# Patient Record
Sex: Male | Born: 1935 | Race: White | Hispanic: No | Marital: Married | Smoking: Former smoker
Health system: Southern US, Community
[De-identification: ages and names within clinical notes are randomized; demographics above are authoritative.]

## PROBLEM LIST (undated history)

## (undated) DIAGNOSIS — N189 Chronic kidney disease, unspecified: Secondary | ICD-10-CM

## (undated) DIAGNOSIS — IMO0002 Reserved for concepts with insufficient information to code with codable children: Secondary | ICD-10-CM

## (undated) DIAGNOSIS — C9002 Multiple myeloma in relapse: Principal | ICD-10-CM

## (undated) DIAGNOSIS — D631 Anemia in chronic kidney disease: Secondary | ICD-10-CM

## (undated) DIAGNOSIS — IMO0001 Reserved for inherently not codable concepts without codable children: Secondary | ICD-10-CM

## (undated) DIAGNOSIS — M1990 Unspecified osteoarthritis, unspecified site: Secondary | ICD-10-CM

## (undated) DIAGNOSIS — F419 Anxiety disorder, unspecified: Secondary | ICD-10-CM

## (undated) DIAGNOSIS — I10 Essential (primary) hypertension: Secondary | ICD-10-CM

## (undated) DIAGNOSIS — E079 Disorder of thyroid, unspecified: Secondary | ICD-10-CM

## (undated) HISTORY — DX: Multiple myeloma in relapse: C90.02

## (undated) HISTORY — DX: Unspecified osteoarthritis, unspecified site: M19.90

## (undated) HISTORY — DX: Reserved for concepts with insufficient information to code with codable children: IMO0002

## (undated) HISTORY — PX: FRACTURE SURGERY: SHX138

## (undated) HISTORY — PX: PROSTATE SURGERY: SHX751

## (undated) HISTORY — DX: Anxiety disorder, unspecified: F41.9

## (undated) HISTORY — DX: Chronic kidney disease, unspecified: N18.9

## (undated) HISTORY — DX: Reserved for inherently not codable concepts without codable children: IMO0001

## (undated) HISTORY — DX: Disorder of thyroid, unspecified: E07.9

## (undated) HISTORY — PX: JOINT REPLACEMENT: SHX530

## (undated) HISTORY — DX: Anemia in chronic kidney disease: D63.1

---

## 1997-08-17 HISTORY — PX: INSERTION PROSTATE RADIATION SEED: SUR718

## 1999-08-26 ENCOUNTER — Ambulatory Visit (HOSPITAL_COMMUNITY): Admission: RE | Admit: 1999-08-26 | Discharge: 1999-08-26 | Payer: Self-pay | Admitting: *Deleted

## 1999-08-26 ENCOUNTER — Encounter (INDEPENDENT_AMBULATORY_CARE_PROVIDER_SITE_OTHER): Payer: Self-pay

## 1999-09-01 ENCOUNTER — Other Ambulatory Visit: Admission: RE | Admit: 1999-09-01 | Discharge: 1999-09-01 | Payer: Self-pay | Admitting: Urology

## 1999-09-12 ENCOUNTER — Encounter: Admission: RE | Admit: 1999-09-12 | Discharge: 1999-12-11 | Payer: Self-pay | Admitting: Radiation Oncology

## 1999-11-14 ENCOUNTER — Ambulatory Visit (HOSPITAL_BASED_OUTPATIENT_CLINIC_OR_DEPARTMENT_OTHER): Admission: RE | Admit: 1999-11-14 | Discharge: 1999-11-14 | Payer: Self-pay | Admitting: Urology

## 1999-11-14 ENCOUNTER — Encounter: Payer: Self-pay | Admitting: Urology

## 1999-12-15 ENCOUNTER — Encounter: Admission: RE | Admit: 1999-12-15 | Discharge: 2000-03-14 | Payer: Self-pay | Admitting: Radiation Oncology

## 2000-11-11 ENCOUNTER — Encounter (INDEPENDENT_AMBULATORY_CARE_PROVIDER_SITE_OTHER): Payer: Self-pay | Admitting: Specialist

## 2000-11-11 ENCOUNTER — Ambulatory Visit (HOSPITAL_COMMUNITY): Admission: RE | Admit: 2000-11-11 | Discharge: 2000-11-11 | Payer: Self-pay | Admitting: *Deleted

## 2001-01-25 ENCOUNTER — Other Ambulatory Visit: Admission: RE | Admit: 2001-01-25 | Discharge: 2001-01-25 | Payer: Self-pay | Admitting: Internal Medicine

## 2001-04-14 ENCOUNTER — Ambulatory Visit (HOSPITAL_COMMUNITY): Admission: RE | Admit: 2001-04-14 | Discharge: 2001-04-14 | Payer: Self-pay | Admitting: *Deleted

## 2001-05-19 ENCOUNTER — Encounter (INDEPENDENT_AMBULATORY_CARE_PROVIDER_SITE_OTHER): Payer: Self-pay | Admitting: Specialist

## 2001-05-19 ENCOUNTER — Ambulatory Visit (HOSPITAL_COMMUNITY): Admission: RE | Admit: 2001-05-19 | Discharge: 2001-05-19 | Payer: Self-pay | Admitting: *Deleted

## 2003-02-27 ENCOUNTER — Ambulatory Visit (HOSPITAL_COMMUNITY): Admission: RE | Admit: 2003-02-27 | Discharge: 2003-02-27 | Payer: Self-pay | Admitting: *Deleted

## 2003-02-27 ENCOUNTER — Encounter (INDEPENDENT_AMBULATORY_CARE_PROVIDER_SITE_OTHER): Payer: Self-pay | Admitting: Specialist

## 2003-08-06 ENCOUNTER — Ambulatory Visit (HOSPITAL_COMMUNITY): Admission: RE | Admit: 2003-08-06 | Discharge: 2003-08-06 | Payer: Self-pay | Admitting: *Deleted

## 2005-01-19 ENCOUNTER — Encounter: Admission: RE | Admit: 2005-01-19 | Discharge: 2005-01-19 | Payer: Self-pay

## 2005-01-21 ENCOUNTER — Ambulatory Visit (HOSPITAL_BASED_OUTPATIENT_CLINIC_OR_DEPARTMENT_OTHER): Admission: RE | Admit: 2005-01-21 | Discharge: 2005-01-21 | Payer: Self-pay

## 2005-01-21 ENCOUNTER — Ambulatory Visit (HOSPITAL_COMMUNITY): Admission: RE | Admit: 2005-01-21 | Discharge: 2005-01-21 | Payer: Self-pay

## 2005-02-02 ENCOUNTER — Emergency Department (HOSPITAL_COMMUNITY): Admission: EM | Admit: 2005-02-02 | Discharge: 2005-02-02 | Payer: Self-pay | Admitting: *Deleted

## 2005-02-03 ENCOUNTER — Inpatient Hospital Stay (HOSPITAL_COMMUNITY): Admission: AD | Admit: 2005-02-03 | Discharge: 2005-02-04 | Payer: Self-pay | Admitting: Pulmonary Disease

## 2005-08-03 ENCOUNTER — Ambulatory Visit (HOSPITAL_COMMUNITY): Admission: RE | Admit: 2005-08-03 | Discharge: 2005-08-03 | Payer: Self-pay | Admitting: *Deleted

## 2005-08-03 ENCOUNTER — Encounter (INDEPENDENT_AMBULATORY_CARE_PROVIDER_SITE_OTHER): Payer: Self-pay | Admitting: Specialist

## 2005-11-19 ENCOUNTER — Ambulatory Visit: Payer: Self-pay | Admitting: Hematology & Oncology

## 2005-11-26 LAB — CBC & DIFF AND RETIC
BASO%: 1.6 % (ref 0.0–2.0)
Eosinophils Absolute: 0.1 10*3/uL (ref 0.0–0.5)
HCT: 26.5 % — ABNORMAL LOW (ref 38.7–49.9)
LYMPH%: 29.2 % (ref 14.0–48.0)
MCHC: 35.8 g/dL (ref 32.0–35.9)
MCV: 88.2 fL (ref 81.6–98.0)
MONO%: 8.5 % (ref 0.0–13.0)
NEUT%: 59.1 % (ref 40.0–75.0)
Platelets: 182 10*3/uL (ref 145–400)
RBC: 3.01 10*6/uL — ABNORMAL LOW (ref 4.20–5.71)
Retic %: 1.2 % (ref 0.7–2.3)

## 2005-11-26 LAB — CHCC SMEAR

## 2005-11-27 LAB — PROTEIN ELECTROPHORESIS, SERUM
Albumin ELP: 64.6 % (ref 55.8–66.1)
Beta Globulin: 10.3 % — ABNORMAL HIGH (ref 4.7–7.2)
Total Protein, Serum Electrophoresis: 6.3 g/dL (ref 6.0–8.3)

## 2005-11-27 LAB — COMPREHENSIVE METABOLIC PANEL
ALT: 8 U/L (ref 0–40)
CO2: 24 mEq/L (ref 19–32)
Calcium: 9.1 mg/dL (ref 8.4–10.5)
Chloride: 107 mEq/L (ref 96–112)
Glucose, Bld: 87 mg/dL (ref 70–99)
Sodium: 139 mEq/L (ref 135–145)
Total Bilirubin: 0.6 mg/dL (ref 0.3–1.2)
Total Protein: 6.3 g/dL (ref 6.0–8.3)

## 2005-11-27 LAB — LACTATE DEHYDROGENASE: LDH: 167 U/L (ref 94–250)

## 2005-11-27 LAB — ERYTHROPOIETIN: Erythropoietin: 98.5 m[IU]/mL — ABNORMAL HIGH (ref 2.6–34.0)

## 2005-12-14 LAB — CBC WITH DIFFERENTIAL/PLATELET
Basophils Absolute: 0 10*3/uL (ref 0.0–0.1)
EOS%: 2.2 % (ref 0.0–7.0)
Eosinophils Absolute: 0.1 10*3/uL (ref 0.0–0.5)
HGB: 8.8 g/dL — ABNORMAL LOW (ref 13.0–17.1)
LYMPH%: 28.2 % (ref 14.0–48.0)
MCH: 30.9 pg (ref 28.0–33.4)
MCV: 87.6 fL (ref 81.6–98.0)
MONO%: 8.3 % (ref 0.0–13.0)
NEUT#: 2.2 10*3/uL (ref 1.5–6.5)
NEUT%: 60.4 % (ref 40.0–75.0)
Platelets: 153 10*3/uL (ref 145–400)

## 2006-01-04 ENCOUNTER — Ambulatory Visit: Payer: Self-pay | Admitting: Hematology & Oncology

## 2006-01-04 LAB — CBC & DIFF AND RETIC
Basophils Absolute: 0.1 10*3/uL (ref 0.0–0.1)
EOS%: 1.9 % (ref 0.0–7.0)
Eosinophils Absolute: 0.1 10*3/uL (ref 0.0–0.5)
HCT: 26 % — ABNORMAL LOW (ref 38.7–49.9)
HGB: 9.1 g/dL — ABNORMAL LOW (ref 13.0–17.1)
IRF: 0.4 — ABNORMAL HIGH (ref 0.070–0.380)
MONO#: 0.3 10*3/uL (ref 0.1–0.9)
NEUT#: 2.4 10*3/uL (ref 1.5–6.5)
NEUT%: 60.8 % (ref 40.0–75.0)
RDW: 20.5 % — ABNORMAL HIGH (ref 11.2–14.6)
RETIC #: 39.3 10*3/uL (ref 31.8–103.9)
WBC: 4 10*3/uL (ref 4.0–10.0)
lymph#: 1.1 10*3/uL (ref 0.9–3.3)

## 2006-01-04 LAB — CHCC SMEAR

## 2006-01-06 LAB — TRANSFERRIN RECEPTOR, SOLUABLE: Transferrin Receptor, Soluble: 7.5 mg/L — ABNORMAL HIGH (ref 2.2–5.0)

## 2006-01-12 LAB — CBC WITH DIFFERENTIAL/PLATELET
Basophils Absolute: 0.1 10*3/uL (ref 0.0–0.1)
Eosinophils Absolute: 0.1 10*3/uL (ref 0.0–0.5)
HCT: 25.2 % — ABNORMAL LOW (ref 38.7–49.9)
HGB: 8.9 g/dL — ABNORMAL LOW (ref 13.0–17.1)
MCH: 30.3 pg (ref 28.0–33.4)
MCV: 85.6 fL (ref 81.6–98.0)
NEUT#: 2.7 10*3/uL (ref 1.5–6.5)
NEUT%: 63.8 % (ref 40.0–75.0)
RDW: 21.2 % — ABNORMAL HIGH (ref 11.2–14.6)
lymph#: 1.1 10*3/uL (ref 0.9–3.3)

## 2006-01-18 LAB — CBC WITH DIFFERENTIAL/PLATELET
Basophils Absolute: 0 10*3/uL (ref 0.0–0.1)
EOS%: 2.1 % (ref 0.0–7.0)
HCT: 26.4 % — ABNORMAL LOW (ref 38.7–49.9)
HGB: 9.2 g/dL — ABNORMAL LOW (ref 13.0–17.1)
LYMPH%: 31.9 % (ref 14.0–48.0)
MCH: 30.3 pg (ref 28.0–33.4)
MCV: 86.4 fL (ref 81.6–98.0)
MONO%: 8.5 % (ref 0.0–13.0)
NEUT%: 57 % (ref 40.0–75.0)
Platelets: 168 10*3/uL (ref 145–400)

## 2006-01-25 LAB — CBC WITH DIFFERENTIAL/PLATELET
Basophils Absolute: 0.1 10*3/uL (ref 0.0–0.1)
EOS%: 2 % (ref 0.0–7.0)
LYMPH%: 29.7 % (ref 14.0–48.0)
MCH: 29.9 pg (ref 28.0–33.4)
MCV: 85.7 fL (ref 81.6–98.0)
MONO%: 8.2 % (ref 0.0–13.0)
Platelets: 160 10*3/uL (ref 145–400)
RBC: 3.21 10*6/uL — ABNORMAL LOW (ref 4.20–5.71)
RDW: 21.3 % — ABNORMAL HIGH (ref 11.2–14.6)

## 2006-02-01 LAB — CBC WITH DIFFERENTIAL/PLATELET
BASO%: 0.5 % (ref 0.0–2.0)
Eosinophils Absolute: 0.1 10*3/uL (ref 0.0–0.5)
MCHC: 35 g/dL (ref 32.0–35.9)
MCV: 85.1 fL (ref 81.6–98.0)
MONO%: 7.6 % (ref 0.0–13.0)
NEUT#: 2.6 10*3/uL (ref 1.5–6.5)
RBC: 3.15 10*6/uL — ABNORMAL LOW (ref 4.20–5.71)
RDW: 20.9 % — ABNORMAL HIGH (ref 11.2–14.6)
WBC: 4 10*3/uL (ref 4.0–10.0)

## 2006-02-08 LAB — CBC WITH DIFFERENTIAL/PLATELET
BASO%: 0.5 % (ref 0.0–2.0)
Eosinophils Absolute: 0.1 10*3/uL (ref 0.0–0.5)
LYMPH%: 26.7 % (ref 14.0–48.0)
MCHC: 34.4 g/dL (ref 32.0–35.9)
MONO#: 0.3 10*3/uL (ref 0.1–0.9)
NEUT#: 2.5 10*3/uL (ref 1.5–6.5)
Platelets: 150 10*3/uL (ref 145–400)
RBC: 3.23 10*6/uL — ABNORMAL LOW (ref 4.20–5.71)
RDW: 21.9 % — ABNORMAL HIGH (ref 11.2–14.6)
WBC: 4 10*3/uL (ref 4.0–10.0)
lymph#: 1.1 10*3/uL (ref 0.9–3.3)

## 2006-02-15 LAB — CBC WITH DIFFERENTIAL/PLATELET
BASO%: 0.7 % (ref 0.0–2.0)
EOS%: 2.3 % (ref 0.0–7.0)
HCT: 26.9 % — ABNORMAL LOW (ref 38.7–49.9)
MCH: 29.5 pg (ref 28.0–33.4)
MCHC: 34.8 g/dL (ref 32.0–35.9)
NEUT%: 64 % (ref 40.0–75.0)
RBC: 3.18 10*6/uL — ABNORMAL LOW (ref 4.20–5.71)
RDW: 22.8 % — ABNORMAL HIGH (ref 11.2–14.6)
lymph#: 0.9 10*3/uL (ref 0.9–3.3)

## 2006-02-23 ENCOUNTER — Encounter (INDEPENDENT_AMBULATORY_CARE_PROVIDER_SITE_OTHER): Payer: Self-pay | Admitting: Specialist

## 2006-02-23 ENCOUNTER — Encounter: Payer: Self-pay | Admitting: Hematology & Oncology

## 2006-02-23 ENCOUNTER — Ambulatory Visit (HOSPITAL_COMMUNITY): Admission: RE | Admit: 2006-02-23 | Discharge: 2006-02-23 | Payer: Self-pay | Admitting: Hematology & Oncology

## 2006-03-02 ENCOUNTER — Ambulatory Visit: Payer: Self-pay | Admitting: Hematology & Oncology

## 2006-03-02 LAB — CBC WITH DIFFERENTIAL/PLATELET
Basophils Absolute: 0 10*3/uL (ref 0.0–0.1)
EOS%: 1.8 % (ref 0.0–7.0)
Eosinophils Absolute: 0.1 10*3/uL (ref 0.0–0.5)
HCT: 24.6 % — ABNORMAL LOW (ref 38.7–49.9)
HGB: 8.8 g/dL — ABNORMAL LOW (ref 13.0–17.1)
MCH: 29.9 pg (ref 28.0–33.4)
MCV: 84 fL (ref 81.6–98.0)
MONO%: 9 % (ref 0.0–13.0)
NEUT%: 64.9 % (ref 40.0–75.0)
Platelets: 135 10*3/uL — ABNORMAL LOW (ref 145–400)

## 2006-03-05 LAB — SPEP & IFE WITH QIG
Albumin ELP: 62 % (ref 55.8–66.1)
Beta 2: 3.7 % (ref 3.2–6.5)
Beta Globulin: 11.7 % — ABNORMAL HIGH (ref 4.7–7.2)
IgA: 80 mg/dL (ref 68–378)
IgG (Immunoglobin G), Serum: 496 mg/dL — ABNORMAL LOW (ref 694–1618)
IgM, Serum: 175 mg/dL (ref 60–263)
M-Spike, %: 0.15 g/dL

## 2006-03-05 LAB — COMPREHENSIVE METABOLIC PANEL
AST: 19 U/L (ref 0–37)
Alkaline Phosphatase: 70 U/L (ref 39–117)
BUN: 26 mg/dL — ABNORMAL HIGH (ref 6–23)
Calcium: 9 mg/dL (ref 8.4–10.5)
Chloride: 109 mEq/L (ref 96–112)
Creatinine, Ser: 1.73 mg/dL — ABNORMAL HIGH (ref 0.40–1.50)
Glucose, Bld: 112 mg/dL — ABNORMAL HIGH (ref 70–99)

## 2006-03-05 LAB — KAPPA/LAMBDA LIGHT CHAINS
Kappa free light chain: 0.7 mg/dL (ref 0.33–1.94)
Lambda Free Lght Chn: 1130 mg/dL — ABNORMAL HIGH (ref 0.57–2.63)

## 2006-03-05 LAB — BETA 2 MICROGLOBULIN, SERUM: Beta-2 Microglobulin: 4.98 mg/L — ABNORMAL HIGH (ref 1.01–1.73)

## 2006-03-08 ENCOUNTER — Ambulatory Visit (HOSPITAL_COMMUNITY): Admission: RE | Admit: 2006-03-08 | Discharge: 2006-03-08 | Payer: Self-pay | Admitting: Hematology & Oncology

## 2006-03-10 LAB — UIFE/LIGHT CHAINS/TP QN, 24-HR UR
Albumin, U: DETECTED
Alpha 1, Urine: DETECTED — AB
Alpha 2, Urine: DETECTED — AB
Beta, Urine: DETECTED — AB
Free Kappa Lt Chains,Ur: 2.66 mg/dL — ABNORMAL HIGH (ref 0.04–1.51)
Gamma Globulin, Urine: DETECTED — AB

## 2006-03-12 LAB — CBC & DIFF AND RETIC
Basophils Absolute: 0.1 10*3/uL (ref 0.0–0.1)
Eosinophils Absolute: 0.1 10*3/uL (ref 0.0–0.5)
HGB: 8.4 g/dL — ABNORMAL LOW (ref 13.0–17.1)
LYMPH%: 29.2 % (ref 14.0–48.0)
MCV: 84 fL (ref 81.6–98.0)
MONO#: 0.3 10*3/uL (ref 0.1–0.9)
MONO%: 8.3 % (ref 0.0–13.0)
NEUT#: 2.3 10*3/uL (ref 1.5–6.5)
Platelets: 155 10*3/uL (ref 145–400)
RBC: 2.82 10*6/uL — ABNORMAL LOW (ref 4.20–5.71)
RDW: 22.5 % — ABNORMAL HIGH (ref 11.2–14.6)
RETIC #: 54.4 10*3/uL (ref 31.8–103.9)
Retic %: 1.9 % (ref 0.7–2.3)
WBC: 4.1 10*3/uL (ref 4.0–10.0)

## 2006-03-12 LAB — CHCC SMEAR

## 2006-03-19 ENCOUNTER — Ambulatory Visit (HOSPITAL_COMMUNITY): Admission: RE | Admit: 2006-03-19 | Discharge: 2006-03-19 | Payer: Self-pay | Admitting: Hematology & Oncology

## 2006-03-22 LAB — BASIC METABOLIC PANEL
BUN: 7 mg/dL (ref 6–23)
CO2: 24 mEq/L (ref 19–32)
Calcium: 8.9 mg/dL (ref 8.4–10.5)
Creatinine, Ser: 1.71 mg/dL — ABNORMAL HIGH (ref 0.40–1.50)
Glucose, Bld: 124 mg/dL — ABNORMAL HIGH (ref 70–99)

## 2006-03-25 LAB — CBC WITH DIFFERENTIAL/PLATELET
Basophils Absolute: 0 10*3/uL (ref 0.0–0.1)
Eosinophils Absolute: 0 10*3/uL (ref 0.0–0.5)
HCT: 27.1 % — ABNORMAL LOW (ref 38.7–49.9)
LYMPH%: 7.2 % — ABNORMAL LOW (ref 14.0–48.0)
MONO#: 0.7 10*3/uL (ref 0.1–0.9)
NEUT#: 8.7 10*3/uL — ABNORMAL HIGH (ref 1.5–6.5)
NEUT%: 86.1 % — ABNORMAL HIGH (ref 40.0–75.0)
Platelets: 141 10*3/uL — ABNORMAL LOW (ref 145–400)
WBC: 10 10*3/uL (ref 4.0–10.0)

## 2006-03-29 LAB — CBC WITH DIFFERENTIAL/PLATELET
Eosinophils Absolute: 0.1 10*3/uL (ref 0.0–0.5)
HCT: 26.8 % — ABNORMAL LOW (ref 38.7–49.9)
LYMPH%: 12.1 % — ABNORMAL LOW (ref 14.0–48.0)
MONO#: 0.8 10*3/uL (ref 0.1–0.9)
NEUT#: 3.4 10*3/uL (ref 1.5–6.5)
Platelets: 120 10*3/uL — ABNORMAL LOW (ref 145–400)
RBC: 3.02 10*6/uL — ABNORMAL LOW (ref 4.20–5.71)
WBC: 4.9 10*3/uL (ref 4.0–10.0)
lymph#: 0.6 10*3/uL — ABNORMAL LOW (ref 0.9–3.3)

## 2006-03-29 LAB — COMPREHENSIVE METABOLIC PANEL
CO2: 22 mEq/L (ref 19–32)
Creatinine, Ser: 1.91 mg/dL — ABNORMAL HIGH (ref 0.40–1.50)
Glucose, Bld: 133 mg/dL — ABNORMAL HIGH (ref 70–99)
Total Bilirubin: 0.5 mg/dL (ref 0.3–1.2)

## 2006-04-12 ENCOUNTER — Ambulatory Visit: Payer: Self-pay | Admitting: Hematology & Oncology

## 2006-04-12 LAB — CBC WITH DIFFERENTIAL/PLATELET
BASO%: 0.4 % (ref 0.0–2.0)
Basophils Absolute: 0 10*3/uL (ref 0.0–0.1)
Eosinophils Absolute: 0 10*3/uL (ref 0.0–0.5)
HCT: 27 % — ABNORMAL LOW (ref 38.7–49.9)
HGB: 8.4 g/dL — ABNORMAL LOW (ref 13.0–17.1)
LYMPH%: 6.1 % — ABNORMAL LOW (ref 14.0–48.0)
MCHC: 31.1 g/dL — ABNORMAL LOW (ref 32.0–35.9)
MONO#: 0.3 10*3/uL (ref 0.1–0.9)
NEUT%: 87.7 % — ABNORMAL HIGH (ref 40.0–75.0)
Platelets: 295 10*3/uL (ref 145–400)
WBC: 5.7 10*3/uL (ref 4.0–10.0)

## 2006-04-15 LAB — CBC WITH DIFFERENTIAL/PLATELET
BASO%: 1.3 % (ref 0.0–2.0)
Basophils Absolute: 0.1 10*3/uL (ref 0.0–0.1)
EOS%: 3.3 % (ref 0.0–7.0)
HCT: 29.5 % — ABNORMAL LOW (ref 38.7–49.9)
HGB: 9.5 g/dL — ABNORMAL LOW (ref 13.0–17.1)
LYMPH%: 14.2 % (ref 14.0–48.0)
MCH: 28.7 pg (ref 28.0–33.4)
MCHC: 32.3 g/dL (ref 32.0–35.9)
MCV: 88.8 fL (ref 81.6–98.0)
MONO%: 18.8 % — ABNORMAL HIGH (ref 0.0–13.0)
NEUT%: 62.3 % (ref 40.0–75.0)
Platelets: 282 10*3/uL (ref 145–400)

## 2006-04-20 LAB — CBC WITH DIFFERENTIAL/PLATELET
BASO%: 1 % (ref 0.0–2.0)
EOS%: 1 % (ref 0.0–7.0)
HCT: 29.9 % — ABNORMAL LOW (ref 38.7–49.9)
LYMPH%: 17.6 % (ref 14.0–48.0)
MCH: 27.7 pg — ABNORMAL LOW (ref 28.0–33.4)
MCHC: 31.1 g/dL — ABNORMAL LOW (ref 32.0–35.9)
MCV: 88.9 fL (ref 81.6–98.0)
MONO#: 1.6 10*3/uL — ABNORMAL HIGH (ref 0.1–0.9)
MONO%: 26.8 % — ABNORMAL HIGH (ref 0.0–13.0)
NEUT%: 53.7 % (ref 40.0–75.0)
Platelets: 194 10*3/uL (ref 145–400)

## 2006-04-20 LAB — BASIC METABOLIC PANEL
CO2: 22 mEq/L (ref 19–32)
Calcium: 8.1 mg/dL — ABNORMAL LOW (ref 8.4–10.5)
Creatinine, Ser: 1.59 mg/dL — ABNORMAL HIGH (ref 0.40–1.50)

## 2006-05-03 LAB — CBC WITH DIFFERENTIAL/PLATELET
BASO%: 0.2 % (ref 0.0–2.0)
Basophils Absolute: 0 10*3/uL (ref 0.0–0.1)
EOS%: 0 % (ref 0.0–7.0)
HGB: 9.5 g/dL — ABNORMAL LOW (ref 13.0–17.1)
MCH: 28.3 pg (ref 28.0–33.4)
RDW: 19.1 % — ABNORMAL HIGH (ref 11.2–14.6)
lymph#: 0.7 10*3/uL — ABNORMAL LOW (ref 0.9–3.3)

## 2006-05-06 LAB — CBC WITH DIFFERENTIAL/PLATELET
Eosinophils Absolute: 0 10*3/uL (ref 0.0–0.5)
HCT: 29.9 % — ABNORMAL LOW (ref 38.7–49.9)
LYMPH%: 13.2 % — ABNORMAL LOW (ref 14.0–48.0)
MCV: 88 fL (ref 81.6–98.0)
MONO#: 1.2 10*3/uL — ABNORMAL HIGH (ref 0.1–0.9)
MONO%: 13.6 % — ABNORMAL HIGH (ref 0.0–13.0)
NEUT#: 6.2 10*3/uL (ref 1.5–6.5)
NEUT%: 72.8 % (ref 40.0–75.0)
Platelets: 339 10*3/uL (ref 145–400)
WBC: 8.5 10*3/uL (ref 4.0–10.0)

## 2006-05-06 LAB — COMPREHENSIVE METABOLIC PANEL
ALT: 13 U/L (ref 0–40)
AST: 16 U/L (ref 0–37)
Albumin: 4 g/dL (ref 3.5–5.2)
BUN: 42 mg/dL — ABNORMAL HIGH (ref 6–23)
Calcium: 8 mg/dL — ABNORMAL LOW (ref 8.4–10.5)
Chloride: 109 mEq/L (ref 96–112)
Potassium: 4.6 mEq/L (ref 3.5–5.3)
Total Protein: 6 g/dL (ref 6.0–8.3)

## 2006-05-06 LAB — PROTEIN ELECTROPHORESIS, SERUM
Beta 2: 4.4 % (ref 3.2–6.5)
Gamma Globulin: 6.2 % — ABNORMAL LOW (ref 11.1–18.8)

## 2006-05-13 LAB — CBC WITH DIFFERENTIAL/PLATELET
BASO%: 0.9 % (ref 0.0–2.0)
EOS%: 0.5 % (ref 0.0–7.0)
HCT: 30.4 % — ABNORMAL LOW (ref 38.7–49.9)
LYMPH%: 18.9 % (ref 14.0–48.0)
MCH: 27.5 pg — ABNORMAL LOW (ref 28.0–33.4)
MCHC: 31.7 g/dL — ABNORMAL LOW (ref 32.0–35.9)
MCV: 86.8 fL (ref 81.6–98.0)
MONO%: 25.3 % — ABNORMAL HIGH (ref 0.0–13.0)
NEUT%: 54.5 % (ref 40.0–75.0)
Platelets: 160 10*3/uL (ref 145–400)

## 2006-05-17 LAB — CBC WITH DIFFERENTIAL/PLATELET
Eosinophils Absolute: 0 10*3/uL (ref 0.0–0.5)
HGB: 10.3 g/dL — ABNORMAL LOW (ref 13.0–17.1)
MONO#: 1.2 10*3/uL — ABNORMAL HIGH (ref 0.1–0.9)
NEUT#: 3.7 10*3/uL (ref 1.5–6.5)
RBC: 3.59 10*6/uL — ABNORMAL LOW (ref 4.20–5.71)
RDW: 20.6 % — ABNORMAL HIGH (ref 11.2–14.6)
WBC: 5.8 10*3/uL (ref 4.0–10.0)

## 2006-05-17 LAB — BASIC METABOLIC PANEL
CO2: 26 mEq/L (ref 19–32)
Chloride: 107 mEq/L (ref 96–112)
Glucose, Bld: 141 mg/dL — ABNORMAL HIGH (ref 70–99)
Potassium: 4.2 mEq/L (ref 3.5–5.3)
Sodium: 142 mEq/L (ref 135–145)

## 2006-05-24 LAB — CBC WITH DIFFERENTIAL/PLATELET
BASO%: 0.5 % (ref 0.0–2.0)
EOS%: 0 % (ref 0.0–7.0)
HCT: 29.7 % — ABNORMAL LOW (ref 38.7–49.9)
LYMPH%: 9.5 % — ABNORMAL LOW (ref 14.0–48.0)
MCH: 28 pg (ref 28.0–33.4)
MCHC: 31.9 g/dL — ABNORMAL LOW (ref 32.0–35.9)
MCV: 87.9 fL (ref 81.6–98.0)
MONO#: 1.4 10*3/uL — ABNORMAL HIGH (ref 0.1–0.9)
MONO%: 12.4 % (ref 0.0–13.0)
NEUT%: 77.5 % — ABNORMAL HIGH (ref 40.0–75.0)
Platelets: 402 10*3/uL — ABNORMAL HIGH (ref 145–400)
RBC: 3.37 10*6/uL — ABNORMAL LOW (ref 4.20–5.71)

## 2006-05-27 ENCOUNTER — Ambulatory Visit: Payer: Self-pay | Admitting: Hematology & Oncology

## 2006-05-27 LAB — CBC WITH DIFFERENTIAL/PLATELET
Basophils Absolute: 0 10*3/uL (ref 0.0–0.1)
Eosinophils Absolute: 0.1 10*3/uL (ref 0.0–0.5)
HGB: 10.4 g/dL — ABNORMAL LOW (ref 13.0–17.1)
MONO#: 1.1 10*3/uL — ABNORMAL HIGH (ref 0.1–0.9)
NEUT#: 5.5 10*3/uL (ref 1.5–6.5)
Platelets: 360 10*3/uL (ref 145–400)
RBC: 3.7 10*6/uL — ABNORMAL LOW (ref 4.20–5.71)
RDW: 17.8 % — ABNORMAL HIGH (ref 11.2–14.6)
WBC: 7.4 10*3/uL (ref 4.0–10.0)

## 2006-05-27 LAB — COMPREHENSIVE METABOLIC PANEL
ALT: 13 U/L (ref 0–40)
Alkaline Phosphatase: 51 U/L (ref 39–117)
CO2: 22 mEq/L (ref 19–32)
Creatinine, Ser: 1.54 mg/dL — ABNORMAL HIGH (ref 0.40–1.50)
Sodium: 140 mEq/L (ref 135–145)
Total Bilirubin: 0.4 mg/dL (ref 0.3–1.2)
Total Protein: 5.5 g/dL — ABNORMAL LOW (ref 6.0–8.3)

## 2006-05-27 LAB — KAPPA/LAMBDA LIGHT CHAINS: Kappa free light chain: 0.3 mg/dL — ABNORMAL LOW (ref 0.33–1.94)

## 2006-06-14 LAB — BASIC METABOLIC PANEL
BUN: 18 mg/dL (ref 6–23)
CO2: 22 mEq/L (ref 19–32)
Glucose, Bld: 177 mg/dL — ABNORMAL HIGH (ref 70–99)
Potassium: 4 mEq/L (ref 3.5–5.3)
Sodium: 139 mEq/L (ref 135–145)

## 2006-06-24 ENCOUNTER — Encounter (INDEPENDENT_AMBULATORY_CARE_PROVIDER_SITE_OTHER): Payer: Self-pay | Admitting: Specialist

## 2006-06-24 ENCOUNTER — Ambulatory Visit (HOSPITAL_COMMUNITY): Admission: RE | Admit: 2006-06-24 | Discharge: 2006-06-24 | Payer: Self-pay | Admitting: Hematology & Oncology

## 2006-06-29 ENCOUNTER — Ambulatory Visit: Payer: Self-pay | Admitting: Hematology & Oncology

## 2006-07-08 LAB — COMPREHENSIVE METABOLIC PANEL
ALT: 12 U/L (ref 0–53)
Alkaline Phosphatase: 62 U/L (ref 39–117)
Sodium: 142 mEq/L (ref 135–145)
Total Bilirubin: 0.4 mg/dL (ref 0.3–1.2)
Total Protein: 6.1 g/dL (ref 6.0–8.3)

## 2006-07-08 LAB — PROTEIN ELECTROPHORESIS, SERUM
Albumin ELP: 57.5 % (ref 55.8–66.1)
Alpha-2-Globulin: 15.5 % — ABNORMAL HIGH (ref 7.1–11.8)
Beta Globulin: 5.4 % (ref 4.7–7.2)
Total Protein, Serum Electrophoresis: 6.1 g/dL (ref 6.0–8.3)

## 2006-07-08 LAB — KAPPA/LAMBDA LIGHT CHAINS
Kappa:Lambda Ratio: 0.12 — ABNORMAL LOW (ref 0.26–1.65)
Lambda Free Lght Chn: 12.1 mg/dL — ABNORMAL HIGH (ref 0.57–2.63)

## 2006-07-12 ENCOUNTER — Ambulatory Visit: Payer: Self-pay | Admitting: Hematology & Oncology

## 2006-07-12 LAB — BASIC METABOLIC PANEL
CO2: 18 mEq/L — ABNORMAL LOW (ref 19–32)
Chloride: 112 mEq/L (ref 96–112)
Creatinine, Ser: 1.3 mg/dL (ref 0.40–1.50)
Glucose, Bld: 183 mg/dL — ABNORMAL HIGH (ref 70–99)

## 2006-07-15 LAB — UIFE/LIGHT CHAINS/TP QN, 24-HR UR
Alpha 1, Urine: DETECTED — AB
Alpha 2, Urine: DETECTED — AB
Beta, Urine: DETECTED — AB
Free Kappa Lt Chains,Ur: 4.39 mg/dL — ABNORMAL HIGH (ref 0.04–1.51)
Free Kappa/Lambda Ratio: 1.93 ratio (ref 0.46–4.00)
Total Protein, Urine-Ur/day: 309 mg/d — ABNORMAL HIGH (ref 10–140)
Total Protein, Urine: 14.9 mg/dL

## 2006-07-19 LAB — CBC WITH DIFFERENTIAL/PLATELET
Basophils Absolute: 0.1 10*3/uL (ref 0.0–0.1)
EOS%: 3.3 % (ref 0.0–7.0)
HGB: 10.1 g/dL — ABNORMAL LOW (ref 13.0–17.1)
MCH: 26.1 pg — ABNORMAL LOW (ref 28.0–33.4)
MONO#: 0.7 10*3/uL (ref 0.1–0.9)
NEUT#: 4.5 10*3/uL (ref 1.5–6.5)
RDW: 16.2 % — ABNORMAL HIGH (ref 11.2–14.6)
WBC: 6.6 10*3/uL (ref 4.0–10.0)
lymph#: 1.1 10*3/uL (ref 0.9–3.3)

## 2006-07-19 LAB — COMPREHENSIVE METABOLIC PANEL
AST: 15 U/L (ref 0–37)
Albumin: 3.7 g/dL (ref 3.5–5.2)
BUN: 14 mg/dL (ref 6–23)
CO2: 21 mEq/L (ref 19–32)
Calcium: 8.5 mg/dL (ref 8.4–10.5)
Chloride: 109 mEq/L (ref 96–112)
Glucose, Bld: 139 mg/dL — ABNORMAL HIGH (ref 70–99)
Potassium: 4.5 mEq/L (ref 3.5–5.3)

## 2006-07-22 LAB — CBC WITH DIFFERENTIAL/PLATELET
Basophils Absolute: 0 10*3/uL (ref 0.0–0.1)
Eosinophils Absolute: 0.2 10*3/uL (ref 0.0–0.5)
HGB: 10.1 g/dL — ABNORMAL LOW (ref 13.0–17.1)
MCV: 83.2 fL (ref 81.6–98.0)
NEUT#: 5 10*3/uL (ref 1.5–6.5)
RDW: 19.5 % — ABNORMAL HIGH (ref 11.2–14.6)
lymph#: 1.2 10*3/uL (ref 0.9–3.3)

## 2006-07-22 LAB — COMPREHENSIVE METABOLIC PANEL
Albumin: 3.6 g/dL (ref 3.5–5.2)
BUN: 16 mg/dL (ref 6–23)
Calcium: 9 mg/dL (ref 8.4–10.5)
Chloride: 109 mEq/L (ref 96–112)
Glucose, Bld: 134 mg/dL — ABNORMAL HIGH (ref 70–99)
Potassium: 4.5 mEq/L (ref 3.5–5.3)

## 2006-07-26 LAB — COMPREHENSIVE METABOLIC PANEL
ALT: 10 U/L (ref 0–53)
AST: 16 U/L (ref 0–37)
Albumin: 3.7 g/dL (ref 3.5–5.2)
Calcium: 8.5 mg/dL (ref 8.4–10.5)
Chloride: 108 mEq/L (ref 96–112)
Potassium: 4.5 mEq/L (ref 3.5–5.3)

## 2006-07-26 LAB — CBC WITH DIFFERENTIAL/PLATELET
Basophils Absolute: 0.1 10*3/uL (ref 0.0–0.1)
Eosinophils Absolute: 0.1 10*3/uL (ref 0.0–0.5)
HGB: 9.7 g/dL — ABNORMAL LOW (ref 13.0–17.1)
MCV: 84.1 fL (ref 81.6–98.0)
MONO#: 1 10*3/uL — ABNORMAL HIGH (ref 0.1–0.9)
NEUT#: 4.4 10*3/uL (ref 1.5–6.5)
RBC: 3.67 10*6/uL — ABNORMAL LOW (ref 4.20–5.71)
RDW: 16.2 % — ABNORMAL HIGH (ref 11.2–14.6)
WBC: 6.6 10*3/uL (ref 4.0–10.0)
lymph#: 1.1 10*3/uL (ref 0.9–3.3)

## 2006-07-29 LAB — BASIC METABOLIC PANEL
BUN: 19 mg/dL (ref 6–23)
Chloride: 107 mEq/L (ref 96–112)
Glucose, Bld: 149 mg/dL — ABNORMAL HIGH (ref 70–99)
Potassium: 3.9 mEq/L (ref 3.5–5.3)
Sodium: 140 mEq/L (ref 135–145)

## 2006-08-09 LAB — CBC WITH DIFFERENTIAL/PLATELET
BASO%: 0.9 % (ref 0.0–2.0)
Eosinophils Absolute: 0.2 10*3/uL (ref 0.0–0.5)
HCT: 32.8 % — ABNORMAL LOW (ref 38.7–49.9)
LYMPH%: 12.8 % — ABNORMAL LOW (ref 14.0–48.0)
MCHC: 32.2 g/dL (ref 32.0–35.9)
MCV: 84 fL (ref 81.6–98.0)
MONO#: 0.8 10*3/uL (ref 0.1–0.9)
MONO%: 10.4 % (ref 0.0–13.0)
NEUT%: 73.2 % (ref 40.0–75.0)
Platelets: 241 10*3/uL (ref 145–400)
RBC: 3.9 10*6/uL — ABNORMAL LOW (ref 4.20–5.71)
WBC: 7.6 10*3/uL (ref 4.0–10.0)

## 2006-08-09 LAB — COMPREHENSIVE METABOLIC PANEL
Alkaline Phosphatase: 61 U/L (ref 39–117)
CO2: 23 mEq/L (ref 19–32)
Creatinine, Ser: 1.49 mg/dL (ref 0.40–1.50)
Glucose, Bld: 119 mg/dL — ABNORMAL HIGH (ref 70–99)
Sodium: 140 mEq/L (ref 135–145)
Total Bilirubin: 0.4 mg/dL (ref 0.3–1.2)

## 2006-08-12 LAB — COMPREHENSIVE METABOLIC PANEL
BUN: 25 mg/dL — ABNORMAL HIGH (ref 6–23)
CO2: 21 mEq/L (ref 19–32)
Creatinine, Ser: 1.91 mg/dL — ABNORMAL HIGH (ref 0.40–1.50)
Glucose, Bld: 136 mg/dL — ABNORMAL HIGH (ref 70–99)
Total Bilirubin: 0.6 mg/dL (ref 0.3–1.2)
Total Protein: 6 g/dL (ref 6.0–8.3)

## 2006-08-12 LAB — CBC WITH DIFFERENTIAL/PLATELET
Basophils Absolute: 0.1 10*3/uL (ref 0.0–0.1)
Eosinophils Absolute: 0.1 10*3/uL (ref 0.0–0.5)
HCT: 33.7 % — ABNORMAL LOW (ref 38.7–49.9)
LYMPH%: 11.9 % — ABNORMAL LOW (ref 14.0–48.0)
MCV: 85.3 fL (ref 81.6–98.0)
MONO#: 1 10*3/uL — ABNORMAL HIGH (ref 0.1–0.9)
MONO%: 10.9 % (ref 0.0–13.0)
NEUT#: 7 10*3/uL — ABNORMAL HIGH (ref 1.5–6.5)
NEUT%: 74.9 % (ref 40.0–75.0)
Platelets: 287 10*3/uL (ref 145–400)
WBC: 9.4 10*3/uL (ref 4.0–10.0)

## 2006-08-16 LAB — CBC WITH DIFFERENTIAL/PLATELET
Basophils Absolute: 0 10*3/uL (ref 0.0–0.1)
EOS%: 1.9 % (ref 0.0–7.0)
HCT: 33.4 % — ABNORMAL LOW (ref 38.7–49.9)
HGB: 10.6 g/dL — ABNORMAL LOW (ref 13.0–17.1)
MCH: 26.8 pg — ABNORMAL LOW (ref 28.0–33.4)
MCV: 84.2 fL (ref 81.6–98.0)
NEUT%: 69.7 % (ref 40.0–75.0)
Platelets: 250 10*3/uL (ref 145–400)
lymph#: 1.4 10*3/uL (ref 0.9–3.3)

## 2006-08-19 LAB — COMPREHENSIVE METABOLIC PANEL
AST: 34 U/L (ref 0–37)
BUN: 20 mg/dL (ref 6–23)
Calcium: 8.4 mg/dL (ref 8.4–10.5)
Chloride: 108 mEq/L (ref 96–112)
Creatinine, Ser: 1.62 mg/dL — ABNORMAL HIGH (ref 0.40–1.50)

## 2006-08-19 LAB — IGG, IGA, IGM
IgG (Immunoglobin G), Serum: 834 mg/dL (ref 694–1618)
IgM, Serum: 109 mg/dL (ref 60–263)

## 2006-08-19 LAB — KAPPA/LAMBDA LIGHT CHAINS
Kappa:Lambda Ratio: 0.46 (ref 0.26–1.65)
Lambda Free Lght Chn: 3.26 mg/dL — ABNORMAL HIGH (ref 0.57–2.63)

## 2006-08-19 LAB — BETA 2 MICROGLOBULIN, SERUM: Beta-2 Microglobulin: 2.74 mg/L — ABNORMAL HIGH (ref 1.01–1.73)

## 2006-08-19 LAB — PROTEIN ELECTROPHORESIS, SERUM
Alpha-1-Globulin: 6.1 % — ABNORMAL HIGH (ref 2.9–4.9)
Gamma Globulin: 12.7 % (ref 11.1–18.8)

## 2006-09-01 ENCOUNTER — Ambulatory Visit: Payer: Self-pay | Admitting: Hematology & Oncology

## 2006-09-06 LAB — BASIC METABOLIC PANEL
BUN: 16 mg/dL (ref 6–23)
Chloride: 110 mEq/L (ref 96–112)
Creatinine, Ser: 1.42 mg/dL (ref 0.40–1.50)

## 2006-09-24 ENCOUNTER — Encounter (INDEPENDENT_AMBULATORY_CARE_PROVIDER_SITE_OTHER): Payer: Self-pay | Admitting: Specialist

## 2006-09-24 ENCOUNTER — Ambulatory Visit (HOSPITAL_COMMUNITY): Admission: RE | Admit: 2006-09-24 | Discharge: 2006-09-24 | Payer: Self-pay | Admitting: *Deleted

## 2006-10-04 LAB — BASIC METABOLIC PANEL
BUN: 27 mg/dL — ABNORMAL HIGH (ref 6–23)
Potassium: 3.9 mEq/L (ref 3.5–5.3)
Sodium: 140 mEq/L (ref 135–145)

## 2006-10-06 LAB — CBC WITH DIFFERENTIAL/PLATELET
Eosinophils Absolute: 0.1 10*3/uL (ref 0.0–0.5)
LYMPH%: 16.5 % (ref 14.0–48.0)
MONO#: 0.1 10*3/uL (ref 0.1–0.9)
NEUT#: 2.4 10*3/uL (ref 1.5–6.5)
Platelets: 118 10*3/uL — ABNORMAL LOW (ref 145–400)
RBC: 3.61 10*6/uL — ABNORMAL LOW (ref 4.20–5.71)
RDW: 14.9 % — ABNORMAL HIGH (ref 11.2–14.6)
WBC: 3.2 10*3/uL — ABNORMAL LOW (ref 4.0–10.0)
lymph#: 0.5 10*3/uL — ABNORMAL LOW (ref 0.9–3.3)

## 2006-11-25 ENCOUNTER — Ambulatory Visit: Payer: Self-pay | Admitting: Hematology & Oncology

## 2006-11-30 LAB — CBC WITH DIFFERENTIAL/PLATELET
Basophils Absolute: 0.1 10*3/uL (ref 0.0–0.1)
Eosinophils Absolute: 0.2 10*3/uL (ref 0.0–0.5)
HCT: 32.7 % — ABNORMAL LOW (ref 38.7–49.9)
HGB: 11.2 g/dL — ABNORMAL LOW (ref 13.0–17.1)
LYMPH%: 24 % (ref 14.0–48.0)
MONO#: 1.2 10*3/uL — ABNORMAL HIGH (ref 0.1–0.9)
NEUT#: 2.7 10*3/uL (ref 1.5–6.5)
NEUT%: 48.9 % (ref 40.0–75.0)
Platelets: 160 10*3/uL (ref 145–400)
WBC: 5.5 10*3/uL (ref 4.0–10.0)

## 2006-11-30 LAB — COMPREHENSIVE METABOLIC PANEL
CO2: 20 mEq/L (ref 19–32)
Calcium: 8.8 mg/dL (ref 8.4–10.5)
Chloride: 109 mEq/L (ref 96–112)
Creatinine, Ser: 1.41 mg/dL (ref 0.40–1.50)
Glucose, Bld: 116 mg/dL — ABNORMAL HIGH (ref 70–99)
Total Bilirubin: 0.6 mg/dL (ref 0.3–1.2)

## 2006-12-27 LAB — COMPREHENSIVE METABOLIC PANEL
BUN: 16 mg/dL (ref 6–23)
CO2: 25 mEq/L (ref 19–32)
Calcium: 9 mg/dL (ref 8.4–10.5)
Chloride: 110 mEq/L (ref 96–112)
Creatinine, Ser: 1.41 mg/dL (ref 0.40–1.50)
Glucose, Bld: 127 mg/dL — ABNORMAL HIGH (ref 70–99)
Total Bilirubin: 0.8 mg/dL (ref 0.3–1.2)

## 2006-12-27 LAB — CBC WITH DIFFERENTIAL/PLATELET
Basophils Absolute: 0 10*3/uL (ref 0.0–0.1)
Eosinophils Absolute: 0.5 10*3/uL (ref 0.0–0.5)
HCT: 34.9 % — ABNORMAL LOW (ref 38.7–49.9)
HGB: 11.8 g/dL — ABNORMAL LOW (ref 13.0–17.1)
LYMPH%: 16.5 % (ref 14.0–48.0)
MCHC: 33.9 g/dL (ref 32.0–35.9)
MONO#: 0.6 10*3/uL (ref 0.1–0.9)
NEUT#: 4 10*3/uL (ref 1.5–6.5)
NEUT%: 66 % (ref 40.0–75.0)
Platelets: 160 10*3/uL (ref 145–400)
WBC: 6 10*3/uL (ref 4.0–10.0)

## 2007-01-27 ENCOUNTER — Ambulatory Visit: Payer: Self-pay | Admitting: Hematology & Oncology

## 2007-03-23 ENCOUNTER — Ambulatory Visit: Payer: Self-pay | Admitting: Hematology & Oncology

## 2007-03-28 LAB — CBC WITH DIFFERENTIAL/PLATELET
Eosinophils Absolute: 0.2 10*3/uL (ref 0.0–0.5)
HCT: 36 % — ABNORMAL LOW (ref 38.7–49.9)
LYMPH%: 13.3 % — ABNORMAL LOW (ref 14.0–48.0)
MCHC: 34.5 g/dL (ref 32.0–35.9)
MONO#: 0.5 10*3/uL (ref 0.1–0.9)
NEUT%: 71.6 % (ref 40.0–75.0)
Platelets: 154 10*3/uL (ref 145–400)
WBC: 4.9 10*3/uL (ref 4.0–10.0)

## 2007-03-31 LAB — COMPREHENSIVE METABOLIC PANEL
ALT: 15 U/L (ref 0–53)
Albumin: 4.1 g/dL (ref 3.5–5.2)
Alkaline Phosphatase: 66 U/L (ref 39–117)
CO2: 23 mEq/L (ref 19–32)
Glucose, Bld: 126 mg/dL — ABNORMAL HIGH (ref 70–99)
Potassium: 4.5 mEq/L (ref 3.5–5.3)
Sodium: 141 mEq/L (ref 135–145)
Total Protein: 6.2 g/dL (ref 6.0–8.3)

## 2007-03-31 LAB — PROTEIN ELECTROPHORESIS, SERUM
Albumin ELP: 61.5 % (ref 55.8–66.1)
Alpha-2-Globulin: 10.3 % (ref 7.1–11.8)
Beta 2: 3.8 % (ref 3.2–6.5)
Beta Globulin: 5.8 % (ref 4.7–7.2)
Total Protein, Serum Electrophoresis: 6.2 g/dL (ref 6.0–8.3)

## 2007-03-31 LAB — BETA 2 MICROGLOBULIN, SERUM: Beta-2 Microglobulin: 2.34 mg/L — ABNORMAL HIGH (ref 1.01–1.73)

## 2007-03-31 LAB — KAPPA/LAMBDA LIGHT CHAINS: Kappa free light chain: 1.06 mg/dL (ref 0.33–1.94)

## 2007-05-10 ENCOUNTER — Encounter: Payer: Self-pay | Admitting: Hematology & Oncology

## 2007-05-10 ENCOUNTER — Ambulatory Visit (HOSPITAL_COMMUNITY): Admission: RE | Admit: 2007-05-10 | Discharge: 2007-05-10 | Payer: Self-pay | Admitting: Hematology & Oncology

## 2007-05-10 ENCOUNTER — Ambulatory Visit: Payer: Self-pay | Admitting: Hematology & Oncology

## 2007-06-23 ENCOUNTER — Ambulatory Visit: Payer: Self-pay | Admitting: Hematology & Oncology

## 2007-06-27 LAB — CBC WITH DIFFERENTIAL/PLATELET
Basophils Absolute: 0.1 10*3/uL (ref 0.0–0.1)
Eosinophils Absolute: 0.1 10*3/uL (ref 0.0–0.5)
HCT: 37 % — ABNORMAL LOW (ref 38.7–49.9)
HGB: 13 g/dL (ref 13.0–17.1)
MCV: 92.1 fL (ref 81.6–98.0)
MONO%: 11.9 % (ref 0.0–13.0)
NEUT#: 3.2 10*3/uL (ref 1.5–6.5)
NEUT%: 67.6 % (ref 40.0–75.0)
RDW: 12 % (ref 11.2–14.6)

## 2007-06-28 LAB — KAPPA/LAMBDA LIGHT CHAINS: Lambda Free Lght Chn: 1.8 mg/dL (ref 0.57–2.63)

## 2007-06-29 LAB — PROTEIN ELECTROPHORESIS, SERUM
Albumin ELP: 61.8 % (ref 55.8–66.1)
Alpha-2-Globulin: 10.4 % (ref 7.1–11.8)
Beta Globulin: 5.7 % (ref 4.7–7.2)
Total Protein, Serum Electrophoresis: 6.6 g/dL (ref 6.0–8.3)

## 2007-06-29 LAB — COMPREHENSIVE METABOLIC PANEL
CO2: 21 mEq/L (ref 19–32)
Creatinine, Ser: 1.4 mg/dL (ref 0.40–1.50)
Glucose, Bld: 158 mg/dL — ABNORMAL HIGH (ref 70–99)
Total Bilirubin: 0.5 mg/dL (ref 0.3–1.2)

## 2007-08-17 ENCOUNTER — Ambulatory Visit: Payer: Self-pay | Admitting: Hematology & Oncology

## 2007-09-27 LAB — CBC WITH DIFFERENTIAL/PLATELET
BASO%: 0.6 % (ref 0.0–2.0)
Eosinophils Absolute: 0.1 10*3/uL (ref 0.0–0.5)
HCT: 38.7 % (ref 38.7–49.9)
LYMPH%: 19.1 % (ref 14.0–48.0)
MCHC: 35.4 g/dL (ref 32.0–35.9)
MCV: 90.3 fL (ref 81.6–98.0)
MONO%: 8.3 % (ref 0.0–13.0)
NEUT%: 69.3 % (ref 40.0–75.0)
Platelets: 172 10*3/uL (ref 145–400)
RBC: 4.29 10*6/uL (ref 4.20–5.71)

## 2007-09-27 LAB — COMPREHENSIVE METABOLIC PANEL
Alkaline Phosphatase: 62 U/L (ref 39–117)
Creatinine, Ser: 1.44 mg/dL (ref 0.40–1.50)
Glucose, Bld: 152 mg/dL — ABNORMAL HIGH (ref 70–99)
Sodium: 140 mEq/L (ref 135–145)
Total Bilirubin: 0.7 mg/dL (ref 0.3–1.2)
Total Protein: 6.8 g/dL (ref 6.0–8.3)

## 2007-09-29 LAB — KAPPA/LAMBDA LIGHT CHAINS
Kappa free light chain: 1.02 mg/dL (ref 0.33–1.94)
Kappa:Lambda Ratio: 0.5 (ref 0.26–1.65)
Lambda Free Lght Chn: 2.02 mg/dL (ref 0.57–2.63)

## 2007-09-29 LAB — PROTEIN ELECTROPHORESIS, SERUM
Albumin ELP: 60.2 % (ref 55.8–66.1)
Alpha-1-Globulin: 5.3 % — ABNORMAL HIGH (ref 2.9–4.9)
Beta 2: 4.9 % (ref 3.2–6.5)
Total Protein, Serum Electrophoresis: 6.8 g/dL (ref 6.0–8.3)

## 2007-09-29 LAB — IGG, IGA, IGM
IgA: 125 mg/dL (ref 68–378)
IgG (Immunoglobin G), Serum: 802 mg/dL (ref 694–1618)

## 2007-09-29 LAB — BETA 2 MICROGLOBULIN, SERUM: Beta-2 Microglobulin: 2.74 mg/L — ABNORMAL HIGH (ref 1.01–1.73)

## 2007-12-22 ENCOUNTER — Ambulatory Visit: Payer: Self-pay | Admitting: Hematology & Oncology

## 2007-12-26 LAB — CBC WITH DIFFERENTIAL/PLATELET
BASO%: 0.6 % (ref 0.0–2.0)
EOS%: 3.3 % (ref 0.0–7.0)
LYMPH%: 12 % — ABNORMAL LOW (ref 14.0–48.0)
MCHC: 33.6 g/dL (ref 32.0–35.9)
MCV: 95.7 fL (ref 81.6–98.0)
MONO%: 7.9 % (ref 0.0–13.0)
Platelets: 138 10*3/uL — ABNORMAL LOW (ref 145–400)
RBC: 3.98 10*6/uL — ABNORMAL LOW (ref 4.20–5.71)
RDW: 13.1 % (ref 11.2–14.6)

## 2007-12-28 LAB — COMPREHENSIVE METABOLIC PANEL
AST: 24 U/L (ref 0–37)
Alkaline Phosphatase: 66 U/L (ref 39–117)
Glucose, Bld: 145 mg/dL — ABNORMAL HIGH (ref 70–99)
Sodium: 142 mEq/L (ref 135–145)
Total Bilirubin: 0.5 mg/dL (ref 0.3–1.2)
Total Protein: 6.9 g/dL (ref 6.0–8.3)

## 2007-12-28 LAB — IGG, IGA, IGM
IgA: 127 mg/dL (ref 68–378)
IgM, Serum: 63 mg/dL (ref 60–263)

## 2007-12-28 LAB — KAPPA/LAMBDA LIGHT CHAINS
Kappa:Lambda Ratio: 0.12 — ABNORMAL LOW (ref 0.26–1.65)
Lambda Free Lght Chn: 9.42 mg/dL — ABNORMAL HIGH (ref 0.57–2.63)

## 2007-12-28 LAB — PROTEIN ELECTROPHORESIS, SERUM
Albumin ELP: 62.1 % (ref 55.8–66.1)
Alpha-1-Globulin: 5.2 % — ABNORMAL HIGH (ref 2.9–4.9)
Alpha-2-Globulin: 11.3 % (ref 7.1–11.8)
Beta 2: 4.1 % (ref 3.2–6.5)

## 2007-12-28 LAB — BETA 2 MICROGLOBULIN, SERUM: Beta-2 Microglobulin: 3.17 mg/L — ABNORMAL HIGH (ref 1.01–1.73)

## 2008-03-22 ENCOUNTER — Ambulatory Visit: Payer: Self-pay | Admitting: Hematology & Oncology

## 2008-03-26 LAB — MANUAL DIFFERENTIAL (CHCC SATELLITE)
BASO: 1 % (ref 0–2)
Eos: 2 % (ref 0–7)
LYMPH: 13 % — ABNORMAL LOW (ref 14–48)
MONO: 7 % (ref 0–13)
SEG: 77 % — ABNORMAL HIGH (ref 40–75)

## 2008-03-26 LAB — CBC WITH DIFFERENTIAL (CANCER CENTER ONLY)
HCT: 36.5 % — ABNORMAL LOW (ref 38.7–49.9)
HGB: 12.6 g/dL — ABNORMAL LOW (ref 13.0–17.1)
RBC: 3.94 10*6/uL — ABNORMAL LOW (ref 4.20–5.70)

## 2008-03-28 LAB — IGG, IGA, IGM
IgA: 111 mg/dL (ref 68–378)
IgG (Immunoglobin G), Serum: 823 mg/dL (ref 694–1618)
IgM, Serum: 69 mg/dL (ref 60–263)

## 2008-03-28 LAB — PROTEIN ELECTROPHORESIS, SERUM
Albumin ELP: 60.1 % (ref 55.8–66.1)
Alpha-1-Globulin: 5 % — ABNORMAL HIGH (ref 2.9–4.9)
Gamma Globulin: 13.1 % (ref 11.1–18.8)

## 2008-03-28 LAB — COMPREHENSIVE METABOLIC PANEL
Alkaline Phosphatase: 79 U/L (ref 39–117)
BUN: 24 mg/dL — ABNORMAL HIGH (ref 6–23)
Glucose, Bld: 267 mg/dL — ABNORMAL HIGH (ref 70–99)
Total Bilirubin: 0.5 mg/dL (ref 0.3–1.2)

## 2008-03-28 LAB — BETA 2 MICROGLOBULIN, SERUM: Beta-2 Microglobulin: 3.08 mg/L — ABNORMAL HIGH (ref 1.01–1.73)

## 2008-04-03 LAB — UIFE/LIGHT CHAINS/TP QN, 24-HR UR
Albumin, U: DETECTED
Alpha 1, Urine: DETECTED — AB
Alpha 2, Urine: DETECTED — AB
Free Kappa Lt Chains,Ur: 7.44 mg/dL — ABNORMAL HIGH (ref 0.04–1.51)
Free Lambda Excretion/Day: 182.4 mg/d
Gamma Globulin, Urine: DETECTED — AB
Time: 24 hours

## 2008-04-04 LAB — UIFE/LIGHT CHAINS/TP QN, 24-HR UR
Albumin, U: DETECTED
Alpha 1, Urine: DETECTED — AB
Free Kappa Lt Chains,Ur: 10.7 mg/dL — ABNORMAL HIGH (ref 0.04–1.51)
Free Kappa/Lambda Ratio: 0.25 ratio — ABNORMAL LOW (ref 0.46–4.00)
Free Lambda Excretion/Day: 681.6 mg/d
Free Lambda Lt Chains,Ur: 42.6 mg/dL — ABNORMAL HIGH (ref 0.08–1.01)
Gamma Globulin, Urine: DETECTED — AB
Time: 24 hours
Total Protein, Urine: 62.7 mg/dL
Volume, Urine: 1600 mL

## 2008-04-10 ENCOUNTER — Ambulatory Visit (HOSPITAL_COMMUNITY): Admission: RE | Admit: 2008-04-10 | Discharge: 2008-04-10 | Payer: Self-pay | Admitting: Hematology & Oncology

## 2008-04-10 ENCOUNTER — Encounter: Payer: Self-pay | Admitting: Hematology & Oncology

## 2008-04-19 ENCOUNTER — Ambulatory Visit (HOSPITAL_BASED_OUTPATIENT_CLINIC_OR_DEPARTMENT_OTHER): Admission: RE | Admit: 2008-04-19 | Discharge: 2008-04-19 | Payer: Self-pay | Admitting: Hematology & Oncology

## 2008-05-07 ENCOUNTER — Ambulatory Visit: Payer: Self-pay | Admitting: Hematology & Oncology

## 2008-05-07 LAB — CBC WITH DIFFERENTIAL (CANCER CENTER ONLY)
BASO#: 0 10*3/uL (ref 0.0–0.2)
Eosinophils Absolute: 0.2 10*3/uL (ref 0.0–0.5)
HCT: 35.5 % — ABNORMAL LOW (ref 38.7–49.9)
HGB: 12.3 g/dL — ABNORMAL LOW (ref 13.0–17.1)
LYMPH%: 17.8 % (ref 14.0–48.0)
MCV: 90 fL (ref 82–98)
MONO#: 0.5 10*3/uL (ref 0.1–0.9)
NEUT%: 71 % (ref 40.0–80.0)
Platelets: 128 10*3/uL — ABNORMAL LOW (ref 145–400)
RBC: 3.95 10*6/uL — ABNORMAL LOW (ref 4.20–5.70)
WBC: 6.2 10*3/uL (ref 4.0–10.0)

## 2008-05-21 LAB — CBC WITH DIFFERENTIAL (CANCER CENTER ONLY)
BASO%: 0.5 % (ref 0.0–2.0)
EOS%: 4.1 % (ref 0.0–7.0)
HCT: 31.6 % — ABNORMAL LOW (ref 38.7–49.9)
LYMPH#: 0.8 10*3/uL — ABNORMAL LOW (ref 0.9–3.3)
LYMPH%: 17.7 % (ref 14.0–48.0)
MCHC: 34.3 g/dL (ref 32.0–35.9)
MONO#: 0.4 10*3/uL (ref 0.1–0.9)
NEUT%: 69.1 % (ref 40.0–80.0)
Platelets: 137 10*3/uL — ABNORMAL LOW (ref 145–400)
RDW: 14.1 % (ref 10.5–14.6)
WBC: 4.8 10*3/uL (ref 4.0–10.0)

## 2008-05-23 LAB — KAPPA/LAMBDA LIGHT CHAINS
Kappa free light chain: 1.36 mg/dL (ref 0.33–1.94)
Kappa:Lambda Ratio: 0.05 — ABNORMAL LOW (ref 0.26–1.65)
Lambda Free Lght Chn: 28.2 mg/dL — ABNORMAL HIGH (ref 0.57–2.63)

## 2008-05-23 LAB — COMPREHENSIVE METABOLIC PANEL
Albumin: 4 g/dL (ref 3.5–5.2)
BUN: 25 mg/dL — ABNORMAL HIGH (ref 6–23)
CO2: 21 mEq/L (ref 19–32)
Calcium: 8.4 mg/dL (ref 8.4–10.5)
Chloride: 108 mEq/L (ref 96–112)
Creatinine, Ser: 1.68 mg/dL — ABNORMAL HIGH (ref 0.40–1.50)
Glucose, Bld: 190 mg/dL — ABNORMAL HIGH (ref 70–99)
Potassium: 4.3 mEq/L (ref 3.5–5.3)

## 2008-05-23 LAB — PROTEIN ELECTROPHORESIS, SERUM: Gamma Globulin: 11.3 % (ref 11.1–18.8)

## 2008-05-23 LAB — IGG, IGA, IGM: IgG (Immunoglobin G), Serum: 673 mg/dL — ABNORMAL LOW (ref 694–1618)

## 2008-05-23 LAB — BETA 2 MICROGLOBULIN, SERUM: Beta-2 Microglobulin: 3.1 mg/L — ABNORMAL HIGH (ref 1.01–1.73)

## 2008-05-28 LAB — CBC WITH DIFFERENTIAL (CANCER CENTER ONLY)
BASO%: 0.7 % (ref 0.0–2.0)
EOS%: 5.2 % (ref 0.0–7.0)
HGB: 10.8 g/dL — ABNORMAL LOW (ref 13.0–17.1)
LYMPH#: 0.7 10*3/uL — ABNORMAL LOW (ref 0.9–3.3)
MCHC: 34.7 g/dL (ref 32.0–35.9)
MONO#: 0.4 10*3/uL (ref 0.1–0.9)
NEUT#: 1.8 10*3/uL (ref 1.5–6.5)
RDW: 15 % — ABNORMAL HIGH (ref 10.5–14.6)
WBC: 3.1 10*3/uL — ABNORMAL LOW (ref 4.0–10.0)

## 2008-06-18 LAB — CBC WITH DIFFERENTIAL (CANCER CENTER ONLY)
BASO%: 1.1 % (ref 0.0–2.0)
EOS%: 4.8 % (ref 0.0–7.0)
LYMPH#: 1 10*3/uL (ref 0.9–3.3)
MCHC: 34 g/dL (ref 32.0–35.9)
NEUT#: 1.7 10*3/uL (ref 1.5–6.5)
Platelets: 227 10*3/uL (ref 145–400)
RDW: 14.9 % — ABNORMAL HIGH (ref 10.5–14.6)

## 2008-06-21 LAB — COMPREHENSIVE METABOLIC PANEL
ALT: 11 U/L (ref 0–53)
Albumin: 4 g/dL (ref 3.5–5.2)
Alkaline Phosphatase: 58 U/L (ref 39–117)
CO2: 24 mEq/L (ref 19–32)
Glucose, Bld: 165 mg/dL — ABNORMAL HIGH (ref 70–99)
Potassium: 4 mEq/L (ref 3.5–5.3)
Sodium: 141 mEq/L (ref 135–145)
Total Protein: 6.3 g/dL (ref 6.0–8.3)

## 2008-06-21 LAB — PROTEIN ELECTROPHORESIS, SERUM
Albumin ELP: 58.6 % (ref 55.8–66.1)
Alpha-1-Globulin: 6.2 % — ABNORMAL HIGH (ref 2.9–4.9)
Beta 2: 4.9 % (ref 3.2–6.5)

## 2008-06-21 LAB — KAPPA/LAMBDA LIGHT CHAINS: Kappa free light chain: 2.34 mg/dL — ABNORMAL HIGH (ref 0.33–1.94)

## 2008-06-21 LAB — IGG, IGA, IGM
IgG (Immunoglobin G), Serum: 778 mg/dL (ref 694–1618)
IgM, Serum: 76 mg/dL (ref 60–263)

## 2008-06-22 ENCOUNTER — Ambulatory Visit: Payer: Self-pay | Admitting: Hematology & Oncology

## 2008-06-25 LAB — CBC WITH DIFFERENTIAL (CANCER CENTER ONLY)
BASO#: 0 10*3/uL (ref 0.0–0.2)
Eosinophils Absolute: 0.1 10*3/uL (ref 0.0–0.5)
HCT: 32.4 % — ABNORMAL LOW (ref 38.7–49.9)
HGB: 11 g/dL — ABNORMAL LOW (ref 13.0–17.1)
LYMPH%: 20 % (ref 14.0–48.0)
MCH: 31.2 pg (ref 28.0–33.4)
MCV: 92 fL (ref 82–98)
MONO#: 0.6 10*3/uL (ref 0.1–0.9)
MONO%: 14.9 % — ABNORMAL HIGH (ref 0.0–13.0)
NEUT%: 62.8 % (ref 40.0–80.0)
Platelets: 146 10*3/uL (ref 145–400)
RBC: 3.53 10*6/uL — ABNORMAL LOW (ref 4.20–5.70)
WBC: 3.9 10*3/uL — ABNORMAL LOW (ref 4.0–10.0)

## 2008-07-16 LAB — CBC WITH DIFFERENTIAL (CANCER CENTER ONLY)
BASO%: 0.3 % (ref 0.0–2.0)
HCT: 31 % — ABNORMAL LOW (ref 38.7–49.9)
LYMPH%: 24.8 % (ref 14.0–48.0)
MCH: 31.3 pg (ref 28.0–33.4)
MCHC: 34.3 g/dL (ref 32.0–35.9)
MCV: 91 fL (ref 82–98)
MONO#: 0.4 10*3/uL (ref 0.1–0.9)
MONO%: 17.6 % — ABNORMAL HIGH (ref 0.0–13.0)
NEUT%: 51.6 % (ref 40.0–80.0)
Platelets: 191 10*3/uL (ref 145–400)
RDW: 15.8 % — ABNORMAL HIGH (ref 10.5–14.6)

## 2008-07-18 LAB — PROTEIN ELECTROPHORESIS, SERUM
Albumin ELP: 57.9 % (ref 55.8–66.1)
Beta 2: 4.2 % (ref 3.2–6.5)
Beta Globulin: 6.1 % (ref 4.7–7.2)

## 2008-07-18 LAB — COMPREHENSIVE METABOLIC PANEL
Alkaline Phosphatase: 46 U/L (ref 39–117)
BUN: 11 mg/dL (ref 6–23)
Creatinine, Ser: 1.2 mg/dL (ref 0.40–1.50)
Glucose, Bld: 154 mg/dL — ABNORMAL HIGH (ref 70–99)
Total Bilirubin: 0.7 mg/dL (ref 0.3–1.2)

## 2008-07-18 LAB — KAPPA/LAMBDA LIGHT CHAINS
Kappa:Lambda Ratio: 0.73 (ref 0.26–1.65)
Lambda Free Lght Chn: 3.95 mg/dL — ABNORMAL HIGH (ref 0.57–2.63)

## 2008-07-18 LAB — IGG, IGA, IGM: IgA: 196 mg/dL (ref 68–378)

## 2008-07-26 LAB — CBC WITH DIFFERENTIAL (CANCER CENTER ONLY)
BASO%: 0.7 % (ref 0.0–2.0)
HCT: 35 % — ABNORMAL LOW (ref 38.7–49.9)
LYMPH%: 18.4 % (ref 14.0–48.0)
MCH: 31.5 pg (ref 28.0–33.4)
MCHC: 34 g/dL (ref 32.0–35.9)
MCV: 93 fL (ref 82–98)
MONO#: 0.5 10*3/uL (ref 0.1–0.9)
NEUT%: 63.7 % (ref 40.0–80.0)
RDW: 16.2 % — ABNORMAL HIGH (ref 10.5–14.6)
WBC: 3.6 10*3/uL — ABNORMAL LOW (ref 4.0–10.0)

## 2008-08-09 ENCOUNTER — Ambulatory Visit: Payer: Self-pay | Admitting: Hematology & Oncology

## 2008-09-10 LAB — CBC WITH DIFFERENTIAL (CANCER CENTER ONLY)
BASO%: 0.3 % (ref 0.0–2.0)
EOS%: 7.9 % — ABNORMAL HIGH (ref 0.0–7.0)
HCT: 34.5 % — ABNORMAL LOW (ref 38.7–49.9)
LYMPH#: 0.7 10*3/uL — ABNORMAL LOW (ref 0.9–3.3)
LYMPH%: 22.6 % (ref 14.0–48.0)
MCHC: 34.3 g/dL (ref 32.0–35.9)
NEUT%: 61 % (ref 40.0–80.0)
RDW: 15.2 % — ABNORMAL HIGH (ref 10.5–14.6)

## 2008-09-12 ENCOUNTER — Encounter: Payer: Self-pay | Admitting: Hematology & Oncology

## 2008-09-12 ENCOUNTER — Ambulatory Visit: Admission: EM | Admit: 2008-09-12 | Discharge: 2008-09-12 | Payer: Self-pay | Admitting: Hematology & Oncology

## 2008-09-12 ENCOUNTER — Ambulatory Visit: Payer: Self-pay | Admitting: Surgery

## 2008-09-14 LAB — COMPREHENSIVE METABOLIC PANEL
Albumin: 4.1 g/dL (ref 3.5–5.2)
CO2: 21 mEq/L (ref 19–32)
Calcium: 8.6 mg/dL (ref 8.4–10.5)
Chloride: 109 mEq/L (ref 96–112)
Glucose, Bld: 127 mg/dL — ABNORMAL HIGH (ref 70–99)
Potassium: 3.8 mEq/L (ref 3.5–5.3)
Sodium: 141 mEq/L (ref 135–145)
Total Protein: 6.4 g/dL (ref 6.0–8.3)

## 2008-09-14 LAB — IGG, IGA, IGM: IgM, Serum: 85 mg/dL (ref 60–263)

## 2008-09-14 LAB — LACTATE DEHYDROGENASE: LDH: 110 U/L (ref 94–250)

## 2008-09-14 LAB — KAPPA/LAMBDA LIGHT CHAINS: Kappa free light chain: 2.55 mg/dL — ABNORMAL HIGH (ref 0.33–1.94)

## 2008-10-05 ENCOUNTER — Ambulatory Visit: Payer: Self-pay | Admitting: Hematology & Oncology

## 2008-10-08 LAB — CBC WITH DIFFERENTIAL (CANCER CENTER ONLY)
BASO#: 0 10*3/uL (ref 0.0–0.2)
Eosinophils Absolute: 0.2 10*3/uL (ref 0.0–0.5)
HCT: 36.3 % — ABNORMAL LOW (ref 38.7–49.9)
HGB: 12.1 g/dL — ABNORMAL LOW (ref 13.0–17.1)
LYMPH%: 22.5 % (ref 14.0–48.0)
MCV: 94 fL (ref 82–98)
MONO#: 0.4 10*3/uL (ref 0.1–0.9)
NEUT%: 64.7 % (ref 40.0–80.0)
RBC: 3.87 10*6/uL — ABNORMAL LOW (ref 4.20–5.70)
WBC: 4.8 10*3/uL (ref 4.0–10.0)

## 2008-10-10 LAB — COMPREHENSIVE METABOLIC PANEL
Alkaline Phosphatase: 60 U/L (ref 39–117)
BUN: 19 mg/dL (ref 6–23)
Creatinine, Ser: 1.29 mg/dL (ref 0.40–1.50)
Glucose, Bld: 153 mg/dL — ABNORMAL HIGH (ref 70–99)
Total Bilirubin: 0.6 mg/dL (ref 0.3–1.2)

## 2008-10-10 LAB — PROTEIN ELECTROPHORESIS, SERUM
Albumin ELP: 57 % (ref 55.8–66.1)
Alpha-1-Globulin: 5.4 % — ABNORMAL HIGH (ref 2.9–4.9)

## 2008-10-10 LAB — IGG, IGA, IGM
IgG (Immunoglobin G), Serum: 931 mg/dL (ref 694–1618)
IgM, Serum: 66 mg/dL (ref 60–263)

## 2008-10-10 LAB — KAPPA/LAMBDA LIGHT CHAINS
Kappa free light chain: 0.92 mg/dL (ref 0.33–1.94)
Kappa:Lambda Ratio: 0.28 (ref 0.26–1.65)
Lambda Free Lght Chn: 3.32 mg/dL — ABNORMAL HIGH (ref 0.57–2.63)

## 2008-11-22 ENCOUNTER — Ambulatory Visit: Payer: Self-pay | Admitting: Hematology & Oncology

## 2008-11-26 LAB — CBC WITH DIFFERENTIAL (CANCER CENTER ONLY)
BASO#: 0 10*3/uL (ref 0.0–0.2)
Eosinophils Absolute: 0.2 10*3/uL (ref 0.0–0.5)
HGB: 11.5 g/dL — ABNORMAL LOW (ref 13.0–17.1)
MONO#: 0.3 10*3/uL (ref 0.1–0.9)
NEUT#: 1.9 10*3/uL (ref 1.5–6.5)
Platelets: 146 10*3/uL (ref 145–400)
RBC: 3.69 10*6/uL — ABNORMAL LOW (ref 4.20–5.70)
WBC: 3.3 10*3/uL — ABNORMAL LOW (ref 4.0–10.0)

## 2008-11-28 LAB — PROTEIN ELECTROPHORESIS, SERUM
Alpha-1-Globulin: 5.1 % — ABNORMAL HIGH (ref 2.9–4.9)
Alpha-2-Globulin: 10.5 % (ref 7.1–11.8)
Beta 2: 4.9 % (ref 3.2–6.5)
Beta Globulin: 6.3 % (ref 4.7–7.2)
Gamma Globulin: 14.6 % (ref 11.1–18.8)

## 2008-11-28 LAB — COMPREHENSIVE METABOLIC PANEL
ALT: 18 U/L (ref 0–53)
AST: 19 U/L (ref 0–37)
Albumin: 3.9 g/dL (ref 3.5–5.2)
Alkaline Phosphatase: 51 U/L (ref 39–117)
BUN: 13 mg/dL (ref 6–23)
Calcium: 9.1 mg/dL (ref 8.4–10.5)
Chloride: 108 mEq/L (ref 96–112)
Potassium: 4.1 mEq/L (ref 3.5–5.3)
Sodium: 140 mEq/L (ref 135–145)
Total Protein: 6.3 g/dL (ref 6.0–8.3)

## 2008-11-28 LAB — IGG, IGA, IGM: IgG (Immunoglobin G), Serum: 932 mg/dL (ref 694–1618)

## 2009-01-15 ENCOUNTER — Ambulatory Visit: Payer: Self-pay | Admitting: Hematology & Oncology

## 2009-01-16 LAB — CBC WITH DIFFERENTIAL (CANCER CENTER ONLY)
BASO%: 0.5 % (ref 0.0–2.0)
HCT: 39.5 % (ref 38.7–49.9)
LYMPH#: 0.9 10*3/uL (ref 0.9–3.3)
MONO#: 0.4 10*3/uL (ref 0.1–0.9)
NEUT%: 51.5 % (ref 40.0–80.0)
RDW: 12.2 % (ref 10.5–14.6)
WBC: 3.2 10*3/uL — ABNORMAL LOW (ref 4.0–10.0)

## 2009-01-18 LAB — PROTEIN ELECTROPHORESIS, SERUM
Albumin ELP: 59.7 % (ref 55.8–66.1)
Beta Globulin: 5.9 % (ref 4.7–7.2)
Total Protein, Serum Electrophoresis: 6.7 g/dL (ref 6.0–8.3)

## 2009-01-18 LAB — KAPPA/LAMBDA LIGHT CHAINS
Kappa free light chain: 2.77 mg/dL — ABNORMAL HIGH (ref 0.33–1.94)
Kappa:Lambda Ratio: 0.56 (ref 0.26–1.65)

## 2009-01-18 LAB — COMPREHENSIVE METABOLIC PANEL
ALT: 15 U/L (ref 0–53)
BUN: 26 mg/dL — ABNORMAL HIGH (ref 6–23)
CO2: 24 mEq/L (ref 19–32)
Creatinine, Ser: 1.74 mg/dL — ABNORMAL HIGH (ref 0.40–1.50)
Total Bilirubin: 0.6 mg/dL (ref 0.3–1.2)

## 2009-02-16 IMAGING — CR DG BONE SURVEY MET
3 series · 3 of 3 positions shown · non-contrast
Comparison: Metastatic bone survey 03/08/2006.

CLINICAL DATA: History of multiple myeloma.  Stem cell transplant 1
year ago.

METASTATIC BONE SURVEY 04/19/2008:

[w t-spine a.p. *]
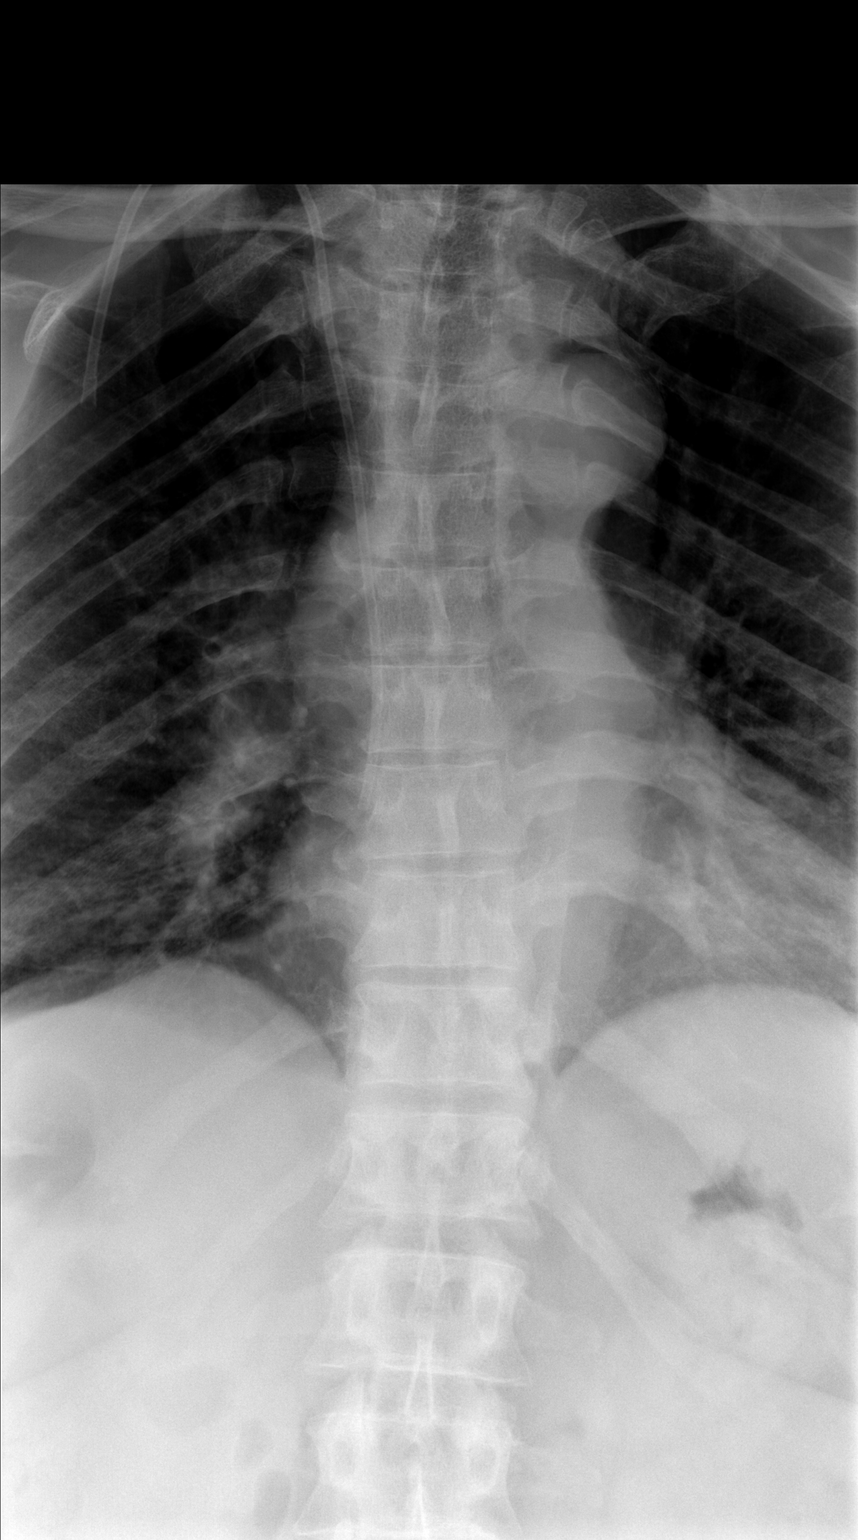

[w t-spine lat *]
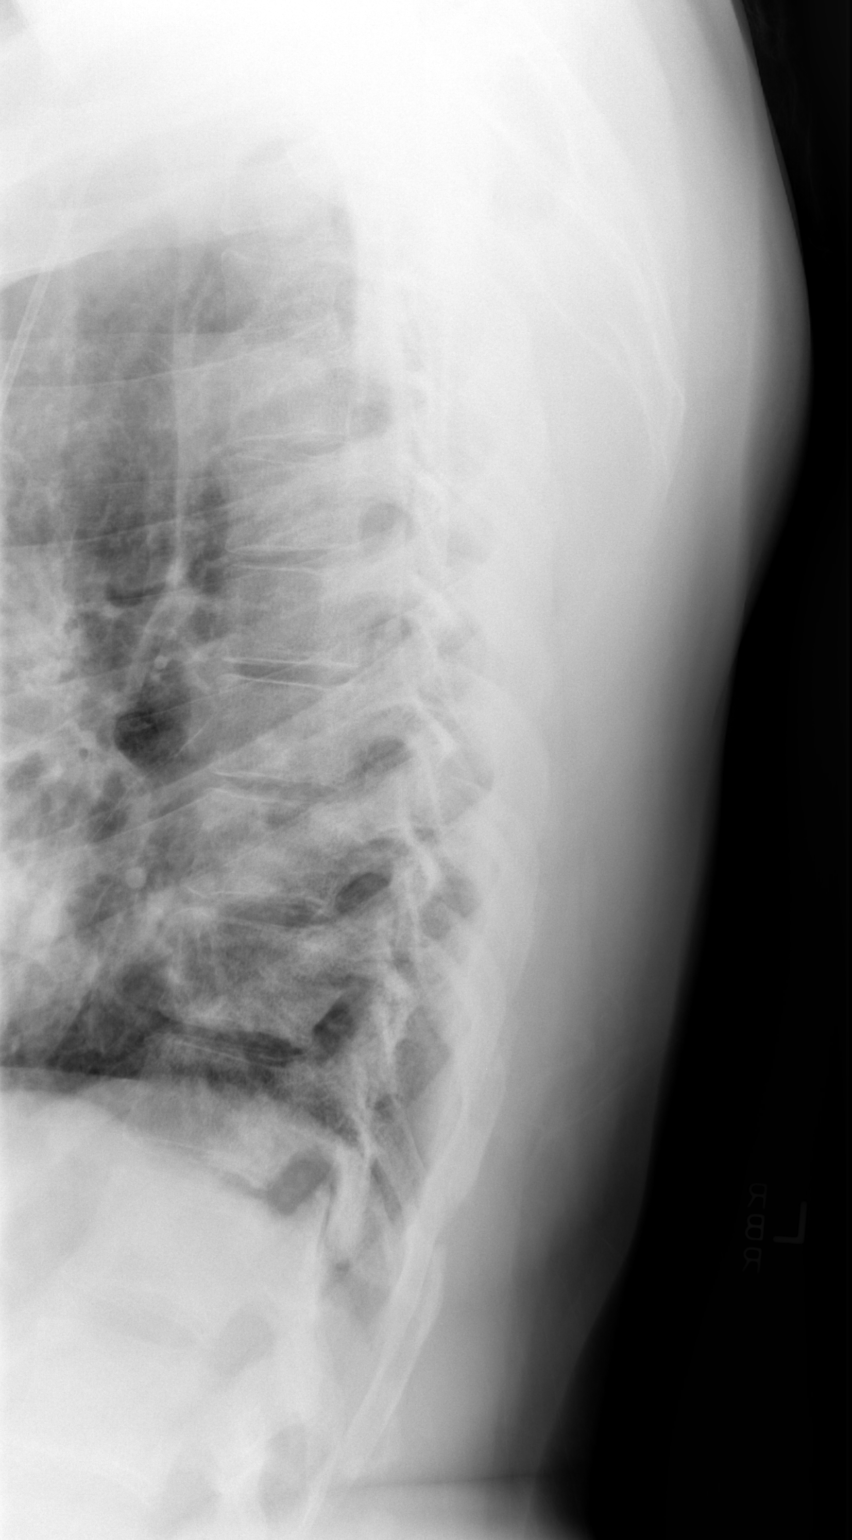

[w swimmers view]
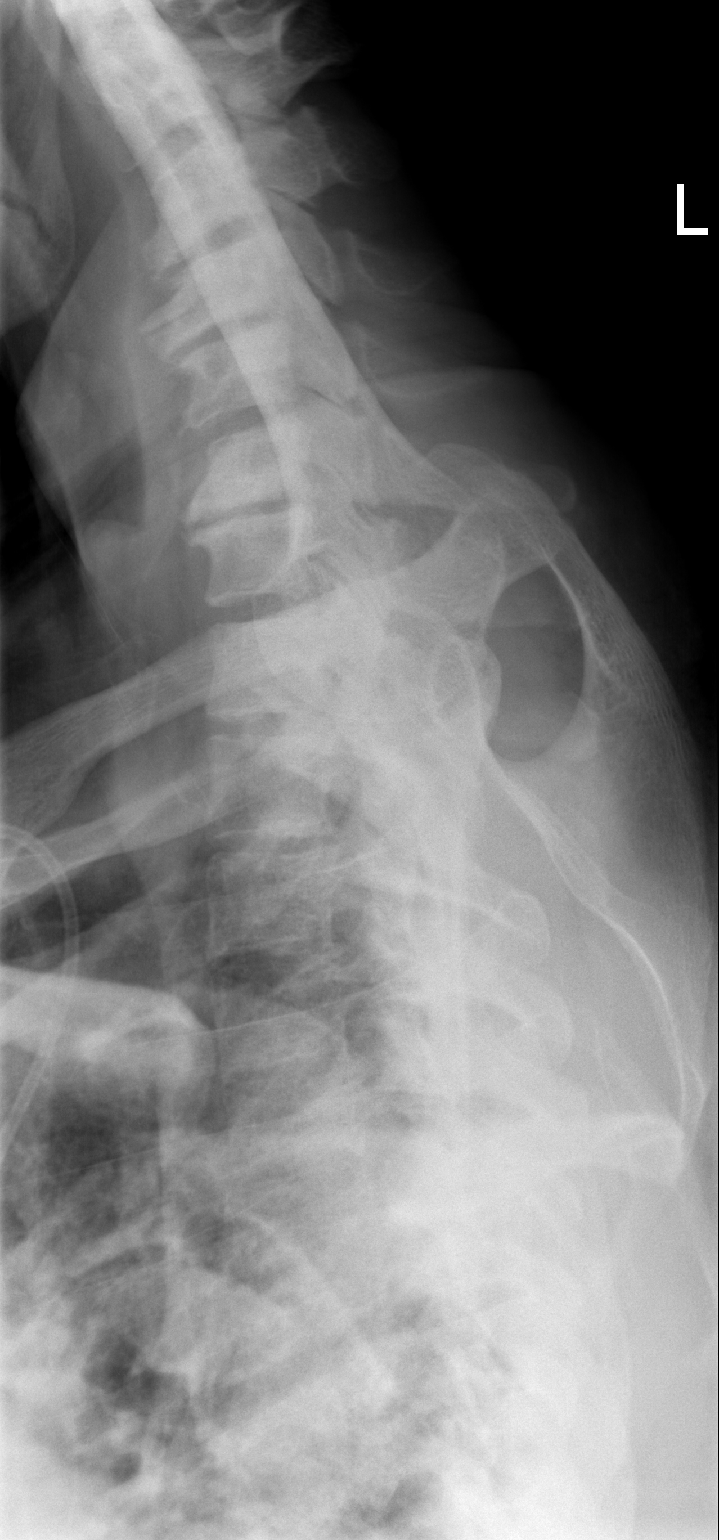

[3 of 3 positions shown; findings below may reference images not displayed]

FINDINGS: The following bones were imaged:

      Skull, lateral: No lytic lesions to suggest myeloma.

      Cervical spine, AP and lateral: Osteopenia.  No focal lytic
lesions.  Disc space narrowing and associated endplate hypertrophic
changes at C3-4, C4-5, C6-7, and to a lesser degree C5-6, not
significantly changed.  Straightening of the usual cervical
lordosis.

      Thoracic spine, AP and lateral: 12 rib-bearing thoracic
vertebra.  Osteopenia.  No focal lytic lesions.  Minimal mid
thoracic spondylosis.

      Lumbar spine, AP and lateral: Five non-rib bearing lumbar
vertebra.  Osteopenia.  No focal lytic lesions.  Disc space
narrowing and associated endplate hypertrophic changes at L4-5 and
to a lesser degree L3-4, slightly progressive.

      Pelvis, AP: No lytic lesions.  Sacroiliac joints and
symphysis pubis intact.  Radioactive seeds in the prostate gland.

      Hips and proximal femurs, AP: No lytic lesions.  Symmetric
mild joint space narrowing in the hips, stable.

      Shoulders and humeri, AP: No lytic lesions.  Degenerative
changes in the acromioclavicular joints bilaterally, left greater
than right.
IMPRESSION: 1.  No lytic lesions to confirm multiple myeloma.
2.  Mild generalized osteopenia.
3.  Degenerative changes within the cervical and lumbar spine, as
detailed above.

## 2009-02-21 ENCOUNTER — Ambulatory Visit: Payer: Self-pay | Admitting: Hematology & Oncology

## 2009-04-16 ENCOUNTER — Ambulatory Visit: Payer: Self-pay | Admitting: Hematology & Oncology

## 2009-04-17 LAB — CBC WITH DIFFERENTIAL (CANCER CENTER ONLY)
BASO%: 0.6 % (ref 0.0–2.0)
LYMPH#: 1 10*3/uL (ref 0.9–3.3)
LYMPH%: 25.5 % (ref 14.0–48.0)
MCV: 91 fL (ref 82–98)
MONO#: 0.4 10*3/uL (ref 0.1–0.9)
Platelets: 161 10*3/uL (ref 145–400)
RDW: 14.4 % (ref 10.5–14.6)
WBC: 3.9 10*3/uL — ABNORMAL LOW (ref 4.0–10.0)

## 2009-04-19 LAB — SPEP & IFE WITH QIG
Alpha-2-Globulin: 9.8 % (ref 7.1–11.8)
Gamma Globulin: 15.9 % (ref 11.1–18.8)
IgG (Immunoglobin G), Serum: 1120 mg/dL (ref 694–1618)
IgM, Serum: 55 mg/dL — ABNORMAL LOW (ref 60–263)

## 2009-04-19 LAB — COMPREHENSIVE METABOLIC PANEL
ALT: 17 U/L (ref 0–53)
AST: 16 U/L (ref 0–37)
Alkaline Phosphatase: 58 U/L (ref 39–117)
Sodium: 139 mEq/L (ref 135–145)
Total Bilirubin: 0.9 mg/dL (ref 0.3–1.2)
Total Protein: 6.6 g/dL (ref 6.0–8.3)

## 2009-04-19 LAB — KAPPA/LAMBDA LIGHT CHAINS: Kappa free light chain: 1.29 mg/dL (ref 0.33–1.94)

## 2009-04-19 LAB — BETA 2 MICROGLOBULIN, SERUM: Beta-2 Microglobulin: 2.3 mg/L — ABNORMAL HIGH (ref 1.01–1.73)

## 2009-05-28 ENCOUNTER — Ambulatory Visit: Payer: Self-pay | Admitting: Hematology & Oncology

## 2009-05-29 LAB — CBC WITH DIFFERENTIAL (CANCER CENTER ONLY)
BASO%: 0.7 % (ref 0.0–2.0)
EOS%: 7.4 % — ABNORMAL HIGH (ref 0.0–7.0)
LYMPH%: 28.4 % (ref 14.0–48.0)
MCH: 31.9 pg (ref 28.0–33.4)
MCHC: 34.1 g/dL (ref 32.0–35.9)
MCV: 93 fL (ref 82–98)
MONO%: 10.7 % (ref 0.0–13.0)
Platelets: 144 10*3/uL — ABNORMAL LOW (ref 145–400)
RDW: 13.7 % (ref 10.5–14.6)

## 2009-05-30 LAB — COMPREHENSIVE METABOLIC PANEL
ALT: 20 U/L (ref 0–53)
AST: 21 U/L (ref 0–37)
Albumin: 4.1 g/dL (ref 3.5–5.2)
Alkaline Phosphatase: 65 U/L (ref 39–117)
Calcium: 8.4 mg/dL (ref 8.4–10.5)
Chloride: 108 mEq/L (ref 96–112)
Potassium: 3.9 mEq/L (ref 3.5–5.3)
Sodium: 139 mEq/L (ref 135–145)
Total Protein: 6.7 g/dL (ref 6.0–8.3)

## 2009-05-30 LAB — KAPPA/LAMBDA LIGHT CHAINS: Kappa:Lambda Ratio: 0.23 — ABNORMAL LOW (ref 0.26–1.65)

## 2009-07-12 ENCOUNTER — Ambulatory Visit: Payer: Self-pay | Admitting: Hematology & Oncology

## 2009-07-15 LAB — CBC WITH DIFFERENTIAL (CANCER CENTER ONLY)
BASO#: 0 10*3/uL (ref 0.0–0.2)
Eosinophils Absolute: 0.2 10*3/uL (ref 0.0–0.5)
HGB: 13.3 g/dL (ref 13.0–17.1)
LYMPH%: 31.4 % (ref 14.0–48.0)
MCH: 30.9 pg (ref 28.0–33.4)
MCHC: 34 g/dL (ref 32.0–35.9)
MCV: 91 fL (ref 82–98)
MONO%: 7.1 % (ref 0.0–13.0)
RBC: 4.29 10*6/uL (ref 4.20–5.70)

## 2009-07-17 LAB — COMPREHENSIVE METABOLIC PANEL
Alkaline Phosphatase: 65 U/L (ref 39–117)
BUN: 18 mg/dL (ref 6–23)
Creatinine, Ser: 1.4 mg/dL (ref 0.40–1.50)
Glucose, Bld: 169 mg/dL — ABNORMAL HIGH (ref 70–99)
Total Bilirubin: 0.8 mg/dL (ref 0.3–1.2)

## 2009-07-17 LAB — SPEP & IFE WITH QIG
Albumin ELP: 58.7 % (ref 55.8–66.1)
IgA: 236 mg/dL (ref 68–378)
IgG (Immunoglobin G), Serum: 1050 mg/dL (ref 694–1618)
Total Protein, Serum Electrophoresis: 6.4 g/dL (ref 6.0–8.3)

## 2009-07-17 LAB — KAPPA/LAMBDA LIGHT CHAINS: Kappa free light chain: 1.62 mg/dL (ref 0.33–1.94)

## 2009-09-05 ENCOUNTER — Ambulatory Visit: Payer: Self-pay | Admitting: Hematology & Oncology

## 2009-11-11 ENCOUNTER — Ambulatory Visit: Payer: Self-pay | Admitting: Hematology & Oncology

## 2009-11-11 LAB — CBC WITH DIFFERENTIAL (CANCER CENTER ONLY)
BASO#: 0 10*3/uL (ref 0.0–0.2)
BASO%: 0.3 % (ref 0.0–2.0)
HCT: 39.2 % (ref 38.7–49.9)
HGB: 13.1 g/dL (ref 13.0–17.1)
LYMPH#: 1.1 10*3/uL (ref 0.9–3.3)
MONO#: 0.3 10*3/uL (ref 0.1–0.9)
NEUT%: 60.8 % (ref 40.0–80.0)
WBC: 3.8 10*3/uL — ABNORMAL LOW (ref 4.0–10.0)

## 2009-11-14 LAB — SPEP & IFE WITH QIG
Beta Globulin: 6 % (ref 4.7–7.2)
Gamma Globulin: 15.3 % (ref 11.1–18.8)
IgA: 193 mg/dL (ref 68–378)
IgG (Immunoglobin G), Serum: 1150 mg/dL (ref 694–1618)
Total Protein, Serum Electrophoresis: 6.8 g/dL (ref 6.0–8.3)

## 2009-11-14 LAB — COMPREHENSIVE METABOLIC PANEL
ALT: 22 U/L (ref 0–53)
Albumin: 4.3 g/dL (ref 3.5–5.2)
CO2: 21 mEq/L (ref 19–32)
Calcium: 8.9 mg/dL (ref 8.4–10.5)
Chloride: 105 mEq/L (ref 96–112)
Creatinine, Ser: 1.53 mg/dL — ABNORMAL HIGH (ref 0.40–1.50)

## 2009-11-14 LAB — KAPPA/LAMBDA LIGHT CHAINS: Kappa:Lambda Ratio: 0.02 — ABNORMAL LOW (ref 0.26–1.65)

## 2009-11-14 LAB — VITAMIN D 25 HYDROXY (VIT D DEFICIENCY, FRACTURES): Vit D, 25-Hydroxy: 25 ng/mL — ABNORMAL LOW (ref 30–89)

## 2009-11-14 LAB — BETA 2 MICROGLOBULIN, SERUM: Beta-2 Microglobulin: 2.61 mg/L — ABNORMAL HIGH (ref 1.01–1.73)

## 2009-11-14 LAB — LACTATE DEHYDROGENASE: LDH: 141 U/L (ref 94–250)

## 2009-12-15 DEATH — deceased

## 2010-01-17 ENCOUNTER — Ambulatory Visit: Payer: Self-pay | Admitting: Hematology & Oncology

## 2010-01-17 LAB — CBC WITH DIFFERENTIAL (CANCER CENTER ONLY)
BASO#: 0 10*3/uL (ref 0.0–0.2)
BASO%: 0.6 % (ref 0.0–2.0)
EOS%: 1 % (ref 0.0–7.0)
HGB: 13.2 g/dL (ref 13.0–17.1)
LYMPH#: 1.1 10*3/uL (ref 0.9–3.3)
MCHC: 34.6 g/dL (ref 32.0–35.9)
MONO#: 0.5 10*3/uL (ref 0.1–0.9)
NEUT#: 2.7 10*3/uL (ref 1.5–6.5)
WBC: 4.3 10*3/uL (ref 4.0–10.0)

## 2010-01-21 LAB — SPEP & IFE WITH QIG
Albumin ELP: 59 % (ref 55.8–66.1)
Alpha-1-Globulin: 5.7 % — ABNORMAL HIGH (ref 2.9–4.9)
IgM, Serum: 58 mg/dL — ABNORMAL LOW (ref 60–263)

## 2010-01-21 LAB — COMPREHENSIVE METABOLIC PANEL
ALT: 23 U/L (ref 0–53)
AST: 26 U/L (ref 0–37)
Albumin: 4.5 g/dL (ref 3.5–5.2)
BUN: 32 mg/dL — ABNORMAL HIGH (ref 6–23)
Calcium: 8.5 mg/dL (ref 8.4–10.5)
Chloride: 111 mEq/L (ref 96–112)
Potassium: 4.1 mEq/L (ref 3.5–5.3)

## 2010-02-04 ENCOUNTER — Ambulatory Visit (HOSPITAL_COMMUNITY): Admission: RE | Admit: 2010-02-04 | Discharge: 2010-02-04 | Payer: Self-pay | Admitting: Hematology & Oncology

## 2010-02-04 ENCOUNTER — Ambulatory Visit: Payer: Self-pay | Admitting: Hematology & Oncology

## 2010-02-21 ENCOUNTER — Ambulatory Visit: Payer: Self-pay | Admitting: Hematology & Oncology

## 2010-02-24 LAB — CBC WITH DIFFERENTIAL (CANCER CENTER ONLY)
Eosinophils Absolute: 0.1 10*3/uL (ref 0.0–0.5)
MCV: 98 fL (ref 82–98)
MONO#: 0.4 10*3/uL (ref 0.1–0.9)
NEUT#: 2.7 10*3/uL (ref 1.5–6.5)
Platelets: 167 10*3/uL (ref 145–400)
RBC: 3.59 10*6/uL — ABNORMAL LOW (ref 4.20–5.70)
WBC: 4.6 10*3/uL (ref 4.0–10.0)

## 2010-02-24 LAB — BASIC METABOLIC PANEL - CANCER CENTER ONLY
BUN, Bld: 19 mg/dL (ref 7–22)
CO2: 26 mEq/L (ref 18–33)
Calcium: 9.3 mg/dL (ref 8.0–10.3)
Creat: 1.4 mg/dl — ABNORMAL HIGH (ref 0.6–1.2)
Glucose, Bld: 203 mg/dL — ABNORMAL HIGH (ref 73–118)

## 2010-03-03 LAB — CBC WITH DIFFERENTIAL (CANCER CENTER ONLY)
BASO#: 0 10*3/uL (ref 0.0–0.2)
BASO%: 0.4 % (ref 0.0–2.0)
EOS%: 3 % (ref 0.0–7.0)
Eosinophils Absolute: 0.1 10*3/uL (ref 0.0–0.5)
HCT: 33.4 % — ABNORMAL LOW (ref 38.7–49.9)
HGB: 11.6 g/dL — ABNORMAL LOW (ref 13.0–17.1)
LYMPH#: 0.8 10*3/uL — ABNORMAL LOW (ref 0.9–3.3)
LYMPH%: 22.3 % (ref 14.0–48.0)
MCH: 33.5 pg — ABNORMAL HIGH (ref 28.0–33.4)
MCHC: 34.7 g/dL (ref 32.0–35.9)
MCV: 97 fL (ref 82–98)
MONO#: 0.3 10*3/uL (ref 0.1–0.9)
MONO%: 7.5 % (ref 0.0–13.0)
NEUT#: 2.4 10*3/uL (ref 1.5–6.5)
NEUT%: 66.8 % (ref 40.0–80.0)
Platelets: 158 10*3/uL (ref 145–400)
RBC: 3.45 10*6/uL — ABNORMAL LOW (ref 4.20–5.70)
RDW: 12.6 % (ref 10.5–14.6)
WBC: 3.6 10*3/uL — ABNORMAL LOW (ref 4.0–10.0)

## 2010-03-17 LAB — CMP (CANCER CENTER ONLY)
ALT(SGPT): 16 U/L (ref 10–47)
CO2: 26 mEq/L (ref 18–33)
Sodium: 138 mEq/L (ref 128–145)
Total Bilirubin: 0.8 mg/dl (ref 0.20–1.60)
Total Protein: 6.6 g/dL (ref 6.4–8.1)

## 2010-03-17 LAB — CBC WITH DIFFERENTIAL (CANCER CENTER ONLY)
BASO%: 0.6 % (ref 0.0–2.0)
LYMPH#: 0.6 10*3/uL — ABNORMAL LOW (ref 0.9–3.3)
LYMPH%: 22.1 % (ref 14.0–48.0)
MONO#: 0.3 10*3/uL (ref 0.1–0.9)
Platelets: 181 10*3/uL (ref 145–400)
RDW: 12.8 % (ref 10.5–14.6)
WBC: 2.5 10*3/uL — ABNORMAL LOW (ref 4.0–10.0)

## 2010-03-19 LAB — SPEP & IFE WITH QIG
Alpha-1-Globulin: 4.7 % (ref 2.9–4.9)
Alpha-2-Globulin: 9.9 % (ref 7.1–11.8)
Beta Globulin: 5.4 % (ref 4.7–7.2)
IgG (Immunoglobin G), Serum: 862 mg/dL (ref 694–1618)
Total Protein, Serum Electrophoresis: 6.5 g/dL (ref 6.0–8.3)

## 2010-03-19 LAB — KAPPA/LAMBDA LIGHT CHAINS: Kappa free light chain: <0.54 mg/dL (ref 0.33–1.94)

## 2010-03-21 LAB — BASIC METABOLIC PANEL - CANCER CENTER ONLY
BUN, Bld: 27 mg/dL — ABNORMAL HIGH (ref 7–22)
CO2: 24 mEq/L (ref 18–33)
Calcium: 8.7 mg/dL (ref 8.0–10.3)
Creat: 1.4 mg/dl — ABNORMAL HIGH (ref 0.6–1.2)
Glucose, Bld: 158 mg/dL — ABNORMAL HIGH (ref 73–118)
Sodium: 141 mEq/L (ref 128–145)

## 2010-03-21 LAB — CBC WITH DIFFERENTIAL (CANCER CENTER ONLY)
BASO#: 0 10*3/uL (ref 0.0–0.2)
Eosinophils Absolute: 0.1 10*3/uL (ref 0.0–0.5)
HCT: 32.7 % — ABNORMAL LOW (ref 38.7–49.9)
HGB: 11.2 g/dL — ABNORMAL LOW (ref 13.0–17.1)
LYMPH%: 20.8 % (ref 14.0–48.0)
MCH: 33.1 pg (ref 28.0–33.4)
MCV: 96 fL (ref 82–98)
MONO#: 0.5 10*3/uL (ref 0.1–0.9)
MONO%: 13.7 % — ABNORMAL HIGH (ref 0.0–13.0)
NEUT%: 63.1 % (ref 40.0–80.0)
Platelets: 156 10*3/uL (ref 145–400)
RBC: 3.39 10*6/uL — ABNORMAL LOW (ref 4.20–5.70)
WBC: 3.3 10*3/uL — ABNORMAL LOW (ref 4.0–10.0)

## 2010-03-24 ENCOUNTER — Ambulatory Visit: Payer: Self-pay | Admitting: Hematology & Oncology

## 2010-03-24 LAB — CBC WITH DIFFERENTIAL (CANCER CENTER ONLY)
BASO%: 1.1 % (ref 0.0–2.0)
EOS%: 3.1 % (ref 0.0–7.0)
HCT: 34.1 % — ABNORMAL LOW (ref 38.7–49.9)
LYMPH#: 0.7 10*3/uL — ABNORMAL LOW (ref 0.9–3.3)
MCHC: 34 g/dL (ref 32.0–35.9)
MONO#: 0.3 10*3/uL (ref 0.1–0.9)
NEUT%: 61.9 % (ref 40.0–80.0)
Platelets: 162 10*3/uL (ref 145–400)
RDW: 13.2 % (ref 10.5–14.6)
WBC: 3 10*3/uL — ABNORMAL LOW (ref 4.0–10.0)

## 2010-04-07 LAB — CMP (CANCER CENTER ONLY)
ALT(SGPT): 11 U/L (ref 10–47)
AST: 12 U/L (ref 11–38)
Albumin: 4.1 g/dL (ref 3.3–5.5)
CO2: 25 mEq/L (ref 18–33)
Calcium: 8.1 mg/dL (ref 8.0–10.3)
Chloride: 105 mEq/L (ref 98–108)
Potassium: 3.9 mEq/L (ref 3.3–4.7)

## 2010-04-07 LAB — CBC WITH DIFFERENTIAL (CANCER CENTER ONLY)
BASO#: 0 10*3/uL (ref 0.0–0.2)
BASO%: 0.4 % (ref 0.0–2.0)
EOS%: 2.9 % (ref 0.0–7.0)
HGB: 10.8 g/dL — ABNORMAL LOW (ref 13.0–17.1)
LYMPH#: 0.5 10*3/uL — ABNORMAL LOW (ref 0.9–3.3)
MCHC: 34.7 g/dL (ref 32.0–35.9)
MONO#: 0.3 10*3/uL (ref 0.1–0.9)
NEUT#: 1.6 10*3/uL (ref 1.5–6.5)
RDW: 13.7 % (ref 10.5–14.6)
WBC: 2.6 10*3/uL — ABNORMAL LOW (ref 4.0–10.0)

## 2010-04-09 LAB — SPEP & IFE WITH QIG
Albumin ELP: 63.8 % (ref 55.8–66.1)
Alpha-1-Globulin: 5 % — ABNORMAL HIGH (ref 2.9–4.9)
Alpha-2-Globulin: 10.2 % (ref 7.1–11.8)
Total Protein, Serum Electrophoresis: 6.3 g/dL (ref 6.0–8.3)

## 2010-04-14 LAB — URINALYSIS, MICROSCOPIC (CHCC SATELLITE)
Glucose: NEGATIVE g/dL
Nitrite: NEGATIVE
Specific Gravity, Urine: 1.025 (ref 1.003–1.035)

## 2010-04-14 LAB — CBC WITH DIFFERENTIAL (CANCER CENTER ONLY)
Eosinophils Absolute: 0.1 10*3/uL (ref 0.0–0.5)
HCT: 33.2 % — ABNORMAL LOW (ref 38.7–49.9)
HGB: 11.3 g/dL — ABNORMAL LOW (ref 13.0–17.1)
LYMPH%: 19.4 % (ref 14.0–48.0)
MCV: 96 fL (ref 82–98)
MONO#: 0.4 10*3/uL (ref 0.1–0.9)
Platelets: 154 10*3/uL (ref 145–400)
RBC: 3.45 10*6/uL — ABNORMAL LOW (ref 4.20–5.70)
WBC: 2.8 10*3/uL — ABNORMAL LOW (ref 4.0–10.0)

## 2010-05-02 ENCOUNTER — Ambulatory Visit: Payer: Self-pay | Admitting: Hematology & Oncology

## 2010-05-12 LAB — CBC WITH DIFFERENTIAL (CANCER CENTER ONLY)
BASO%: 0.4 % (ref 0.0–2.0)
EOS%: 2.4 % (ref 0.0–7.0)
LYMPH%: 15.8 % (ref 14.0–48.0)
MCH: 33 pg (ref 28.0–33.4)
MCV: 97 fL (ref 82–98)
MONO%: 10.5 % (ref 0.0–13.0)
Platelets: 155 10*3/uL (ref 145–400)
RDW: 13.3 % (ref 10.5–14.6)

## 2010-05-26 LAB — CBC WITH DIFFERENTIAL (CANCER CENTER ONLY)
BASO%: 0.3 % (ref 0.0–2.0)
EOS%: 4.7 % (ref 0.0–7.0)
HCT: 34.8 % — ABNORMAL LOW (ref 38.7–49.9)
LYMPH%: 16.7 % (ref 14.0–48.0)
MCH: 33 pg (ref 28.0–33.4)
MCHC: 33.8 g/dL (ref 32.0–35.9)
MCV: 98 fL (ref 82–98)
MONO#: 0.4 10*3/uL (ref 0.1–0.9)
MONO%: 13.8 % — ABNORMAL HIGH (ref 0.0–13.0)
NEUT%: 64.5 % (ref 40.0–80.0)
Platelets: 211 10*3/uL (ref 145–400)
RDW: 14.4 % (ref 10.5–14.6)

## 2010-05-26 LAB — CMP (CANCER CENTER ONLY)
ALT(SGPT): 161 U/L — ABNORMAL HIGH (ref 10–47)
Alkaline Phosphatase: 41 U/L (ref 26–84)
CO2: 29 mEq/L (ref 18–33)
Creat: 1.2 mg/dl (ref 0.6–1.2)
Sodium: 142 mEq/L (ref 128–145)
Total Bilirubin: 0.7 mg/dl (ref 0.20–1.60)

## 2010-05-28 LAB — KAPPA/LAMBDA LIGHT CHAINS: Kappa:Lambda Ratio: 0.1 — ABNORMAL LOW (ref 0.26–1.65)

## 2010-05-28 LAB — PROTEIN ELECTROPHORESIS, SERUM
Albumin ELP: 63.8 % (ref 55.8–66.1)
Alpha-2-Globulin: 10.8 % (ref 7.1–11.8)
Total Protein, Serum Electrophoresis: 6.3 g/dL (ref 6.0–8.3)

## 2010-05-28 LAB — BETA 2 MICROGLOBULIN, SERUM: Beta-2 Microglobulin: 2.29 mg/L — ABNORMAL HIGH (ref 1.01–1.73)

## 2010-05-28 LAB — LACTATE DEHYDROGENASE: LDH: 490 U/L — ABNORMAL HIGH (ref 94–250)

## 2010-06-02 ENCOUNTER — Ambulatory Visit: Payer: Self-pay | Admitting: Hematology & Oncology

## 2010-06-03 LAB — CMP (CANCER CENTER ONLY)
ALT(SGPT): 38 U/L (ref 10–47)
Alkaline Phosphatase: 44 U/L (ref 26–84)
CO2: 28 mEq/L (ref 18–33)
Potassium: 4.6 mEq/L (ref 3.3–4.7)
Sodium: 142 mEq/L (ref 128–145)
Total Bilirubin: 0.7 mg/dl (ref 0.20–1.60)
Total Protein: 6.9 g/dL (ref 6.4–8.1)

## 2010-06-30 LAB — CBC WITH DIFFERENTIAL (CANCER CENTER ONLY)
BASO%: 0.4 % (ref 0.0–2.0)
Eosinophils Absolute: 0.1 10*3/uL (ref 0.0–0.5)
LYMPH%: 15.9 % (ref 14.0–48.0)
MCV: 98 fL (ref 82–98)
MONO#: 0.5 10*3/uL (ref 0.1–0.9)
MONO%: 11 % (ref 0.0–13.0)
NEUT#: 2.9 10*3/uL (ref 1.5–6.5)
Platelets: 153 10*3/uL (ref 145–400)
RBC: 3.85 10*6/uL — ABNORMAL LOW (ref 4.20–5.70)
RDW: 12.7 % (ref 10.5–14.6)
WBC: 4.2 10*3/uL (ref 4.0–10.0)

## 2010-06-30 LAB — CMP (CANCER CENTER ONLY)
ALT(SGPT): 20 U/L (ref 10–47)
Albumin: 4.2 g/dL (ref 3.3–5.5)
Alkaline Phosphatase: 49 U/L (ref 26–84)
CO2: 29 mEq/L (ref 18–33)
Glucose, Bld: 151 mg/dL — ABNORMAL HIGH (ref 73–118)
Potassium: 4.3 mEq/L (ref 3.3–4.7)
Sodium: 143 mEq/L (ref 128–145)
Total Bilirubin: 1 mg/dl (ref 0.20–1.60)
Total Protein: 7.2 g/dL (ref 6.4–8.1)

## 2010-07-02 LAB — SPEP & IFE WITH QIG
Albumin ELP: 62.4 % (ref 55.8–66.1)
Alpha-1-Globulin: 4.5 % (ref 2.9–4.9)
Beta 2: 4.7 % (ref 3.2–6.5)
IgA: 155 mg/dL (ref 68–378)
IgM, Serum: 41 mg/dL — ABNORMAL LOW (ref 60–263)
Total Protein, Serum Electrophoresis: 7.1 g/dL (ref 6.0–8.3)

## 2010-08-01 ENCOUNTER — Ambulatory Visit: Payer: Self-pay | Admitting: Hematology & Oncology

## 2010-08-04 LAB — CBC WITH DIFFERENTIAL (CANCER CENTER ONLY)
BASO%: 0.5 % (ref 0.0–2.0)
EOS%: 3.4 % (ref 0.0–7.0)
LYMPH%: 17.1 % (ref 14.0–48.0)
MCHC: 34.1 g/dL (ref 32.0–35.9)
MCV: 97 fL (ref 82–98)
MONO#: 0.4 10*3/uL (ref 0.1–0.9)
Platelets: 179 10*3/uL (ref 145–400)
RDW: 12.2 % (ref 10.5–14.6)
WBC: 4 10*3/uL (ref 4.0–10.0)

## 2010-08-06 LAB — SPEP & IFE WITH QIG
Gamma Globulin: 13 % (ref 11.1–18.8)
IgA: 156 mg/dL (ref 68–378)
IgG (Immunoglobin G), Serum: 986 mg/dL (ref 694–1618)
IgM, Serum: 43 mg/dL — ABNORMAL LOW (ref 60–263)
Total Protein, Serum Electrophoresis: 6.7 g/dL (ref 6.0–8.3)

## 2010-08-06 LAB — COMPREHENSIVE METABOLIC PANEL
ALT: 20 U/L (ref 0–53)
AST: 23 U/L (ref 0–37)
Alkaline Phosphatase: 55 U/L (ref 39–117)
CO2: 20 mEq/L (ref 19–32)
Creatinine, Ser: 1.41 mg/dL (ref 0.40–1.50)
Sodium: 135 mEq/L (ref 135–145)
Total Bilirubin: 0.6 mg/dL (ref 0.3–1.2)
Total Protein: 6.7 g/dL (ref 6.0–8.3)

## 2010-08-06 LAB — KAPPA/LAMBDA LIGHT CHAINS: Kappa:Lambda Ratio: 0.12 — ABNORMAL LOW (ref 0.26–1.65)

## 2010-09-02 ENCOUNTER — Ambulatory Visit (HOSPITAL_BASED_OUTPATIENT_CLINIC_OR_DEPARTMENT_OTHER): Payer: Medicare PPO | Admitting: Hematology & Oncology

## 2010-09-03 LAB — CBC WITH DIFFERENTIAL (CANCER CENTER ONLY)
BASO#: 0 10*3/uL (ref 0.0–0.2)
BASO%: 0.4 % (ref 0.0–2.0)
EOS%: 3.2 % (ref 0.0–7.0)
Eosinophils Absolute: 0.2 10*3/uL (ref 0.0–0.5)
HCT: 39.7 % (ref 38.7–49.9)
HGB: 13.6 g/dL (ref 13.0–17.1)
LYMPH#: 1.1 10*3/uL (ref 0.9–3.3)
LYMPH%: 21.9 % (ref 14.0–48.0)
MCH: 33 pg (ref 28.0–33.4)
MCHC: 34.2 g/dL (ref 32.0–35.9)
MCV: 97 fL (ref 82–98)
MONO#: 0.6 10*3/uL (ref 0.1–0.9)
MONO%: 11.6 % (ref 0.0–13.0)
NEUT#: 3.1 10*3/uL (ref 1.5–6.5)
NEUT%: 62.9 % (ref 40.0–80.0)
Platelets: 183 10*3/uL (ref 145–400)
RBC: 4.11 10*6/uL — ABNORMAL LOW (ref 4.20–5.70)
RDW: 11.8 % (ref 10.5–14.6)
WBC: 4.9 10*3/uL (ref 4.0–10.0)

## 2010-09-03 LAB — CMP (CANCER CENTER ONLY)
ALT(SGPT): 27 U/L (ref 10–47)
AST: 24 U/L (ref 11–38)
Albumin: 3.9 g/dL (ref 3.3–5.5)
Alkaline Phosphatase: 49 U/L (ref 26–84)
CO2: 27 mEq/L (ref 18–33)
Calcium: 8.5 mg/dL (ref 8.0–10.3)
Chloride: 103 mEq/L (ref 98–108)
Potassium: 4.2 mEq/L (ref 3.3–4.7)
Sodium: 144 mEq/L (ref 128–145)
Total Bilirubin: 0.8 mg/dl (ref 0.20–1.60)
Total Protein: 7.3 g/dL (ref 6.4–8.1)

## 2010-09-05 LAB — BETA 2 MICROGLOBULIN, SERUM: Beta-2 Microglobulin: 2.33 mg/L — ABNORMAL HIGH (ref 1.01–1.73)

## 2010-09-05 LAB — PROTEIN ELECTROPHORESIS, SERUM
Albumin ELP: 61.7 % (ref 55.8–66.1)
Alpha-1-Globulin: 4.4 % (ref 2.9–4.9)
Alpha-2-Globulin: 9.6 % (ref 7.1–11.8)
Beta 2: 5 % (ref 3.2–6.5)
Beta Globulin: 5.7 % (ref 4.7–7.2)
Gamma Globulin: 13.6 % (ref 11.1–18.8)
Total Protein, Serum Electrophoresis: 7.4 g/dL (ref 6.0–8.3)

## 2010-09-05 LAB — KAPPA/LAMBDA LIGHT CHAINS
Kappa free light chain: 1 mg/dL (ref 0.33–1.94)
Kappa:Lambda Ratio: 0.09 — ABNORMAL LOW (ref 0.26–1.65)
Lambda Free Lght Chn: 10.6 mg/dL — ABNORMAL HIGH (ref 0.57–2.63)

## 2010-10-30 ENCOUNTER — Ambulatory Visit: Payer: Medicare PPO | Admitting: Physical Therapy

## 2010-11-02 LAB — DIFFERENTIAL
Eosinophils Relative: 2 % (ref 0–5)
Lymphocytes Relative: 22 % (ref 12–46)
Lymphs Abs: 0.8 10*3/uL (ref 0.7–4.0)
Monocytes Absolute: 0.4 10*3/uL (ref 0.1–1.0)
Monocytes Relative: 11 % (ref 3–12)
Neutro Abs: 2.3 10*3/uL (ref 1.7–7.7)

## 2010-11-02 LAB — CBC
HCT: 32.4 % — ABNORMAL LOW (ref 39.0–52.0)
Hemoglobin: 10.8 g/dL — ABNORMAL LOW (ref 13.0–17.0)
RBC: 3.24 MIL/uL — ABNORMAL LOW (ref 4.22–5.81)
RDW: 14.8 % (ref 11.5–15.5)

## 2010-11-02 LAB — TISSUE HYBRIDIZATION (BONE MARROW)-NCBH

## 2010-11-03 ENCOUNTER — Ambulatory Visit: Payer: Medicare PPO | Attending: Orthopedic Surgery | Admitting: Physical Therapy

## 2010-11-03 DIAGNOSIS — M25569 Pain in unspecified knee: Secondary | ICD-10-CM | POA: Insufficient documentation

## 2010-11-03 DIAGNOSIS — M25669 Stiffness of unspecified knee, not elsewhere classified: Secondary | ICD-10-CM | POA: Insufficient documentation

## 2010-11-03 DIAGNOSIS — IMO0001 Reserved for inherently not codable concepts without codable children: Secondary | ICD-10-CM | POA: Insufficient documentation

## 2010-11-03 DIAGNOSIS — R262 Difficulty in walking, not elsewhere classified: Secondary | ICD-10-CM | POA: Insufficient documentation

## 2010-11-05 ENCOUNTER — Ambulatory Visit: Payer: Medicare PPO | Admitting: Physical Therapy

## 2010-11-06 ENCOUNTER — Encounter (HOSPITAL_BASED_OUTPATIENT_CLINIC_OR_DEPARTMENT_OTHER): Payer: Medicare PPO | Admitting: Hematology & Oncology

## 2010-11-06 DIAGNOSIS — Z452 Encounter for adjustment and management of vascular access device: Secondary | ICD-10-CM

## 2010-11-06 DIAGNOSIS — C9000 Multiple myeloma not having achieved remission: Secondary | ICD-10-CM

## 2010-11-06 LAB — CBC WITH DIFFERENTIAL (CANCER CENTER ONLY)
BASO%: 0.3 % (ref 0.0–2.0)
EOS%: 3.1 % (ref 0.0–7.0)
Eosinophils Absolute: 0.2 10*3/uL (ref 0.0–0.5)
LYMPH%: 14 % (ref 14.0–48.0)
MCH: 32.6 pg (ref 28.0–33.4)
MCHC: 34.9 g/dL (ref 32.0–35.9)
MCV: 93 fL (ref 82–98)
MONO%: 9.9 % (ref 0.0–13.0)
NEUT#: 4.3 10*3/uL (ref 1.5–6.5)
Platelets: 224 10*3/uL (ref 145–400)
RDW: 12.3 % (ref 11.1–15.7)

## 2010-11-07 ENCOUNTER — Ambulatory Visit: Payer: Medicare PPO | Admitting: Physical Therapy

## 2010-11-07 LAB — COMPREHENSIVE METABOLIC PANEL
ALT: 22 U/L (ref 0–53)
Albumin: 4.4 g/dL (ref 3.5–5.2)
CO2: 22 mEq/L (ref 19–32)
Calcium: 9.2 mg/dL (ref 8.4–10.5)
Chloride: 104 mEq/L (ref 96–112)
Glucose, Bld: 129 mg/dL — ABNORMAL HIGH (ref 70–99)
Sodium: 137 mEq/L (ref 135–145)
Total Bilirubin: 0.7 mg/dL (ref 0.3–1.2)
Total Protein: 7.1 g/dL (ref 6.0–8.3)

## 2010-11-07 LAB — KAPPA/LAMBDA LIGHT CHAINS
Kappa free light chain: 1.02 mg/dL (ref 0.33–1.94)
Lambda Free Lght Chn: 29.3 mg/dL — ABNORMAL HIGH (ref 0.57–2.63)

## 2010-11-10 ENCOUNTER — Ambulatory Visit: Payer: Medicare PPO | Admitting: Physical Therapy

## 2010-11-12 ENCOUNTER — Ambulatory Visit: Payer: Medicare PPO | Attending: Orthopedic Surgery | Admitting: Physical Therapy

## 2010-11-12 DIAGNOSIS — IMO0001 Reserved for inherently not codable concepts without codable children: Secondary | ICD-10-CM | POA: Insufficient documentation

## 2010-11-12 DIAGNOSIS — R262 Difficulty in walking, not elsewhere classified: Secondary | ICD-10-CM | POA: Insufficient documentation

## 2010-11-12 DIAGNOSIS — M25569 Pain in unspecified knee: Secondary | ICD-10-CM | POA: Insufficient documentation

## 2010-11-12 DIAGNOSIS — M25669 Stiffness of unspecified knee, not elsewhere classified: Secondary | ICD-10-CM | POA: Insufficient documentation

## 2010-11-14 ENCOUNTER — Ambulatory Visit: Payer: Medicare PPO | Admitting: Physical Therapy

## 2010-11-17 ENCOUNTER — Ambulatory Visit: Payer: Medicare PPO | Attending: Orthopedic Surgery

## 2010-11-17 DIAGNOSIS — IMO0001 Reserved for inherently not codable concepts without codable children: Secondary | ICD-10-CM | POA: Insufficient documentation

## 2010-11-17 DIAGNOSIS — M25669 Stiffness of unspecified knee, not elsewhere classified: Secondary | ICD-10-CM | POA: Insufficient documentation

## 2010-11-17 DIAGNOSIS — M25569 Pain in unspecified knee: Secondary | ICD-10-CM | POA: Insufficient documentation

## 2010-11-17 DIAGNOSIS — R262 Difficulty in walking, not elsewhere classified: Secondary | ICD-10-CM | POA: Insufficient documentation

## 2010-11-19 ENCOUNTER — Encounter: Payer: Medicare PPO | Admitting: Physical Therapy

## 2010-11-24 ENCOUNTER — Encounter: Payer: Medicare PPO | Admitting: Hematology & Oncology

## 2010-11-24 ENCOUNTER — Encounter (HOSPITAL_BASED_OUTPATIENT_CLINIC_OR_DEPARTMENT_OTHER): Payer: Medicare PPO | Admitting: Hematology & Oncology

## 2010-11-24 ENCOUNTER — Encounter: Payer: Medicare PPO | Admitting: Physical Therapy

## 2010-11-24 ENCOUNTER — Other Ambulatory Visit: Payer: Self-pay | Admitting: Hematology & Oncology

## 2010-11-24 DIAGNOSIS — Z5111 Encounter for antineoplastic chemotherapy: Secondary | ICD-10-CM

## 2010-11-24 DIAGNOSIS — Z5112 Encounter for antineoplastic immunotherapy: Secondary | ICD-10-CM

## 2010-11-24 DIAGNOSIS — C9000 Multiple myeloma not having achieved remission: Secondary | ICD-10-CM

## 2010-11-24 DIAGNOSIS — M1710 Unilateral primary osteoarthritis, unspecified knee: Secondary | ICD-10-CM

## 2010-11-24 LAB — CBC WITH DIFFERENTIAL (CANCER CENTER ONLY)
BASO%: 0.3 % (ref 0.0–2.0)
Eosinophils Absolute: 0.1 10*3/uL (ref 0.0–0.5)
LYMPH%: 15.7 % (ref 14.0–48.0)
MCV: 94 fL (ref 82–98)
MONO#: 0.6 10*3/uL (ref 0.1–0.9)
NEUT#: 4.1 10*3/uL (ref 1.5–6.5)
Platelets: 185 10*3/uL (ref 145–400)
RBC: 3.75 10*6/uL — ABNORMAL LOW (ref 4.20–5.70)
RDW: 13 % (ref 11.1–15.7)
WBC: 5.8 10*3/uL (ref 4.0–10.0)

## 2010-11-24 LAB — CMP (CANCER CENTER ONLY)
ALT(SGPT): 13 U/L (ref 10–47)
AST: 21 U/L (ref 11–38)
Albumin: 3.7 g/dL (ref 3.3–5.5)
Alkaline Phosphatase: 51 U/L (ref 26–84)
BUN, Bld: 18 mg/dL (ref 7–22)
CO2: 27 meq/L (ref 18–33)
Calcium: 9.1 mg/dL (ref 8.0–10.3)
Chloride: 104 meq/L (ref 98–108)
Creat: 1.5 mg/dL — ABNORMAL HIGH (ref 0.6–1.2)
Glucose, Bld: 108 mg/dL (ref 73–118)
Potassium: 4.2 meq/L (ref 3.3–4.7)
Sodium: 140 meq/L (ref 128–145)
Total Bilirubin: 0.7 mg/dL (ref 0.20–1.60)
Total Protein: 7.5 g/dL (ref 6.4–8.1)

## 2010-11-26 ENCOUNTER — Ambulatory Visit: Payer: Medicare PPO | Admitting: Physical Therapy

## 2010-11-28 ENCOUNTER — Encounter: Payer: Medicare PPO | Admitting: Physical Therapy

## 2010-12-01 ENCOUNTER — Other Ambulatory Visit: Payer: Self-pay | Admitting: Hematology & Oncology

## 2010-12-01 ENCOUNTER — Encounter (HOSPITAL_BASED_OUTPATIENT_CLINIC_OR_DEPARTMENT_OTHER): Payer: Medicare PPO | Admitting: Hematology & Oncology

## 2010-12-01 ENCOUNTER — Encounter: Payer: Medicare PPO | Admitting: Physical Therapy

## 2010-12-01 DIAGNOSIS — C9000 Multiple myeloma not having achieved remission: Secondary | ICD-10-CM

## 2010-12-01 DIAGNOSIS — Z5112 Encounter for antineoplastic immunotherapy: Secondary | ICD-10-CM

## 2010-12-01 DIAGNOSIS — M1710 Unilateral primary osteoarthritis, unspecified knee: Secondary | ICD-10-CM

## 2010-12-01 DIAGNOSIS — Z5111 Encounter for antineoplastic chemotherapy: Secondary | ICD-10-CM

## 2010-12-01 LAB — CBC WITH DIFFERENTIAL (CANCER CENTER ONLY)
BASO%: 0.2 % (ref 0.0–2.0)
EOS%: 2.4 % (ref 0.0–7.0)
LYMPH%: 11.7 % — ABNORMAL LOW (ref 14.0–48.0)
MCH: 31.9 pg (ref 28.0–33.4)
MCV: 92 fL (ref 82–98)
MONO%: 10.9 % (ref 0.0–13.0)
NEUT#: 3.8 10*3/uL (ref 1.5–6.5)
Platelets: 166 10*3/uL (ref 145–400)
RDW: 13 % (ref 11.1–15.7)

## 2010-12-03 ENCOUNTER — Encounter: Payer: Medicare PPO | Admitting: Physical Therapy

## 2010-12-05 ENCOUNTER — Ambulatory Visit: Payer: Medicare PPO | Admitting: Physical Therapy

## 2010-12-08 ENCOUNTER — Encounter: Payer: Medicare PPO | Admitting: Physical Therapy

## 2010-12-10 ENCOUNTER — Encounter: Payer: Medicare PPO | Admitting: Physical Therapy

## 2010-12-12 ENCOUNTER — Encounter: Payer: Medicare PPO | Admitting: Physical Therapy

## 2010-12-15 ENCOUNTER — Encounter (HOSPITAL_BASED_OUTPATIENT_CLINIC_OR_DEPARTMENT_OTHER): Payer: Medicare PPO | Admitting: Hematology & Oncology

## 2010-12-15 ENCOUNTER — Other Ambulatory Visit: Payer: Self-pay | Admitting: Hematology & Oncology

## 2010-12-15 DIAGNOSIS — Z5111 Encounter for antineoplastic chemotherapy: Secondary | ICD-10-CM

## 2010-12-15 DIAGNOSIS — M1710 Unilateral primary osteoarthritis, unspecified knee: Secondary | ICD-10-CM

## 2010-12-15 DIAGNOSIS — Z5112 Encounter for antineoplastic immunotherapy: Secondary | ICD-10-CM

## 2010-12-15 DIAGNOSIS — C9000 Multiple myeloma not having achieved remission: Secondary | ICD-10-CM

## 2010-12-15 LAB — CBC WITH DIFFERENTIAL (CANCER CENTER ONLY)
BASO%: 0.7 % (ref 0.0–2.0)
LYMPH#: 0.6 10*3/uL — ABNORMAL LOW (ref 0.9–3.3)
LYMPH%: 20.7 % (ref 14.0–48.0)
MONO#: 0.5 10*3/uL (ref 0.1–0.9)
NEUT#: 1.9 10*3/uL (ref 1.5–6.5)
Platelets: 198 10*3/uL (ref 145–400)
RDW: 13.5 % (ref 11.1–15.7)
WBC: 3 10*3/uL — ABNORMAL LOW (ref 4.0–10.0)

## 2010-12-24 ENCOUNTER — Encounter (HOSPITAL_BASED_OUTPATIENT_CLINIC_OR_DEPARTMENT_OTHER): Payer: Medicare PPO | Admitting: Hematology & Oncology

## 2010-12-24 ENCOUNTER — Other Ambulatory Visit: Payer: Self-pay | Admitting: Hematology & Oncology

## 2010-12-24 DIAGNOSIS — C9000 Multiple myeloma not having achieved remission: Secondary | ICD-10-CM

## 2010-12-24 DIAGNOSIS — Z5111 Encounter for antineoplastic chemotherapy: Secondary | ICD-10-CM

## 2010-12-24 DIAGNOSIS — G4700 Insomnia, unspecified: Secondary | ICD-10-CM

## 2010-12-24 DIAGNOSIS — M1710 Unilateral primary osteoarthritis, unspecified knee: Secondary | ICD-10-CM

## 2010-12-24 LAB — CBC WITH DIFFERENTIAL (CANCER CENTER ONLY)
BASO%: 0.3 % (ref 0.0–2.0)
HCT: 33.1 % — ABNORMAL LOW (ref 38.7–49.9)
HGB: 11.5 g/dL — ABNORMAL LOW (ref 13.0–17.1)
LYMPH#: 0.6 10*3/uL — ABNORMAL LOW (ref 0.9–3.3)
MONO#: 0.5 10*3/uL (ref 0.1–0.9)
NEUT%: 64 % (ref 40.0–80.0)
RBC: 3.55 10*6/uL — ABNORMAL LOW (ref 4.20–5.70)
RDW: 13.8 % (ref 11.1–15.7)
WBC: 3.3 10*3/uL — ABNORMAL LOW (ref 4.0–10.0)

## 2010-12-29 LAB — COMPREHENSIVE METABOLIC PANEL
ALT: 11 U/L (ref 0–53)
CO2: 21 mEq/L (ref 19–32)
Calcium: 8.9 mg/dL (ref 8.4–10.5)
Chloride: 107 mEq/L (ref 96–112)
Creatinine, Ser: 1.39 mg/dL (ref 0.40–1.50)
Total Protein: 6.6 g/dL (ref 6.0–8.3)

## 2010-12-29 LAB — SPEP & IFE WITH QIG
Albumin ELP: 61.5 % (ref 55.8–66.1)
Alpha-1-Globulin: 5.3 % — ABNORMAL HIGH (ref 2.9–4.9)
IgM, Serum: 53 mg/dL — ABNORMAL LOW (ref 60–263)
Total Protein, Serum Electrophoresis: 6.6 g/dL (ref 6.0–8.3)

## 2010-12-29 LAB — KAPPA/LAMBDA LIGHT CHAINS: Lambda Free Lght Chn: 18 mg/dL — ABNORMAL HIGH (ref 0.57–2.63)

## 2010-12-29 LAB — LACTATE DEHYDROGENASE: LDH: 165 U/L (ref 94–250)

## 2010-12-30 NOTE — Op Note (Signed)
NAME:  Arthur Valdez, Arthur Valdez NO.:  [id number]   MEDICAL RECORD NO.:  [id number]          PATIENT TYPE:  OUT   LOCATION:  OMED                         FACILITY:  [facility]   PHYSICIAN:  Rose Phi. Myna Hidalgo, M.D. DATE OF BIRTH:  [birth year]   DATE OF PROCEDURE:  05/10/2007  DATE OF DISCHARGE:                               OPERATIVE REPORT   PROCEDURE:  Left __________  crest bone marrow biopsy and aspirate.   [name] is brought to the short-stay unit, and an IV was placed  without difficulty.  He was then placed onto his right side.   Preoperatively he received a total of 5 mg of Versed and 25 mg of  Demerol for IV sedation.   The left posterior iliac crest region was prepped and draped in a  sterile fashion, 8 mL of 2%lidocaine was infiltrated under the skin down  to the periosteum.  I did have to use a 22-gauge to reach the  periosteum.   A #11 scalpel was used to make incision into the skin.  Two bone marrow  aspirates were obtained without difficulty.   A second incision was made into the skin.  An excellent bone marrow  biopsy core was obtained without difficulty.   The patient tolerated the procedure well.  There were no complications.      Rose Phi. Myna Hidalgo, M.D.  Electronically Signed     PRE/MEDQ  D:  05/10/2007  T:  05/10/2007  Job:  [id number]

## 2010-12-30 NOTE — Op Note (Signed)
NAME:  AFSHIN, CHRYSTAL NO.:  [id number]   MEDICAL RECORD NO.:  [id number]          PATIENT TYPE:  AMB   LOCATION:  OMED                         FACILITY:  [facility]   PHYSICIAN:  Rose Phi. Myna Hidalgo, M.D. DATE OF BIRTH:  December 06, 1932   DATE OF PROCEDURE:  04/10/2008  DATE OF DISCHARGE:                               OPERATIVE REPORT   NAME OF PROCEDURE:  Left posterior crest bone marrow biopsy and  aspirate.   Mr. Nickless was brought to the short stay unit.  He had an IV placed to  his left hand.   He was then placed on his right side.   His Mallampati score was 1.  His ASA is 4.  Classification was 1.   He then received Versed 5 mg and Demerol 50 mg IV for sedation.   Left posterior crest region was prepped and draped in a sterile fashion.  Ten mL of 2% lidocaine was infiltrated under the skin down to the  periosteum.  I did require a 22-gauge spinal needle to reach the  periosteum.   A #1 scalpel was used to make an incision into the skin.   Two bone marrow aspirates were obtained without difficulty.   We then obtained a good bone marrow biopsy core without difficulty.   The patient tolerated the procedure well.  There were no complications.      Rose Phi. Myna Hidalgo, M.D.  Electronically Signed     PRE/MEDQ  D:  04/10/2008  T:  04/10/2008  Job:  [id number]

## 2011-01-02 NOTE — H&P (Signed)
NAME:  Arthur, [name]   MEDICAL RECORD NO.:  [id number]          PATIENT TYPE:  EMS   LOCATION:  ED                           FACILITY:  [facility]   PHYSICIAN:  Hettie Holstein, D.O.    DATE OF BIRTH:  1932-11-13   DATE OF ADMISSION:  02/02/2005  DATE OF DISCHARGE:                                HISTORY & PHYSICAL   PRIMARY CARE PHYSICIAN:  The patient sees Dr. Perrin Maltese at the [facility].   CHIEF COMPLAINT:  Mouth swelling.   HISTORY OF PRESENT ILLNESS:  Arthur Valdez is a pleasant 77 year old African-  American male who recently underwent a right inguinal herniorrhaphy, and had  been doing quite well, in his usual state of health when he started noticing  lip swelling developing in the evening on Sunday, and presented to the  urgent care center, at which time he received a prednisone dose pack.  On  Monday evening, around 5:30, he developed some tongue swelling predominantly  on the right, and subsequently was brought to the emergency department.  The  family or patient cannot recall any new things that he has ingested  recently.  He has been on lisinopril for a couple of years, and has not had  a reaction similar to this in the past.  He continued on this  perioperatively, and this is not a new medication.  He had been given some  ibuprofen for some of the pain in his groin postoperatively, as well as  Darvocet.  Otherwise, no medications are explicitly attributable to his  initial presentation.   In the emergency department, concerns were raised as this lung swelling  progressed.  He was not at present in acute respiratory distress.   PAST MEDICAL HISTORY:  1.  History of hypertension.  2.  Hypothyroidism.   MEDICATIONS:  1.  Lisinopril/HCTZ 10/12.5 daily.  2.  Levoxyl 75 mcg daily.  3.  Vitamin daily.  4.  Prilosec over-the-counter daily.   ALLERGIES:  No known drug allergies.   SURGICAL HISTORY:  Significant for seed implants 5  years ago in his  prostate.   SOCIAL HISTORY:  The patient quit smoking around 10-15 years ago.  Drinks  only occasional alcohol.  He is a former [employer].  Denies elicit drug  use.   FAMILY HISTORY:  He had no prior history of angioedema in the family.   REVIEW OF SYSTEMS:  This was wholly unremarkable.  The patient had been in  his usual state of health, ambulating.  He has had numbness at his inguinal  site, though has had no fevers, no productive cough.  Further review of  systems as been unremarkable.   PHYSICAL EXAMINATION:  VITAL SIGNS:  The patient's O2 saturations are 95%.  His vital signs were stable.  He has a low-grade temperature of 100.1.  GENERAL:  Arthur Valdez is a pleasant male in no acute distress.  He is  breathing comfortably, though has just developed a glossitis and angioedema,  felt to be drug related.  However, it cannot be stated  for certain, unless  __________ is the usual culprit.  However, he had been on this medication  for extended periods of time without problems in the past.   PLAN:  We will continue the prednisone taper, follow his course clinically,  administer IV fluids, observe him for 23-hour observation, and discharge if  he does appear to be doing better.       ESS/MEDQ  D:  02/03/2005  T:  02/03/2005  Job:  [id number]   cc:   [name], M.D.  [facility]  [address]  [place]  [place] [address]  Fax: [phone]

## 2011-01-02 NOTE — Procedures (Signed)
[facility]  Patient:    Arthur Valdez, Arthur Valdez Visit Number: [id number] MRN: [id number]          Service Type: END Location: ENDO Attending Physician:  Sabino Gasser Proc. Date: 05/19/01 Admit Date:  05/19/2001   CC:         Robert Bellow, M.D., [facility]   Procedure Report  PROCEDURE:  Colonoscopy with biopsy and polypectomy.  INDICATIONS:  Colon polyps seen in the past, were unable to be removed because of poor prep on recent colonoscopy.  ANESTHESIA:  Demerol 75, Versed 8 mg.  DESCRIPTION OF PROCEDURE:  With the patient mildly sedated in the left lateral decubitus position, the Olympus videoscopic colonoscope was inserted in the rectum after rectal exam and passed under direct vision to the cecum, identified by the ileocecal valve and appendiceal orifice, both of which were photographed.  From this point, the colonoscope was slowly withdrawn taking circumferential views of the entire colonic mucosa, stopping only in the hepatic flexure area where there small polyps were seen, photographed, and removed, two by hot biopsy forceps technique, one by snare cautery technique all with the setting of 20-20 blended current.  All of the tissue was retrieved for pathology.  The endoscope was withdrawn then all the way to the rectum which appeared normal on direct view and showed hemorrhoids on retroflexed view.  The endoscope was straightened and withdrawn.  The patients vital signs and pulse oximeter remained stable.  The patient tolerated the procedure well without apparent complications.  FINDINGS:  Internal hemorrhoids and polyps of hepatic flexure.  Await biopsy report.  The patient will call me for results and follow up with me as an outpatient. Attending Physician:  Sabino Gasser DD:  05/19/01 TD:  05/19/01 Job: [id number]

## 2011-01-02 NOTE — Discharge Summary (Signed)
NAME:  Arthur Valdez, Arthur Valdez NO.:  [id number]   MEDICAL RECORD NO.:  [id number]          PATIENT TYPE:  INP   LOCATION:  4733                         FACILITY:  [facility]   PHYSICIAN:  Danae Chen, M.D.DATE OF BIRTH:  [birth year]   DATE OF ADMISSION:  02/03/2005  DATE OF DISCHARGE:  02/04/2005                                 DISCHARGE SUMMARY   FOLLOW UP:  Dr. Perrin Maltese at the Urgent [facility].   DISCHARGE DIAGNOSES:  1.  Probable angioedema, questionable etiology of the precipitating      medication.  2.  History of hypertension.  3.  History of hypothyroidism.   DISCHARGE MEDICATIONS:  New medications include:  1.  Norvasc 5 mg p.o. daily.  2.  Hydrochlorothiazide 25 mg p.o. daily.  3.  Prednisone taper Dosepak to be taken as prescribed.   Patient will resume taking his other home medications including:  1.  Levoxyl 75 mcg p.o. daily.  2.  Multivitamin p.o. daily.  3.  Saw palmetto as needed p.o. daily.  4.  Prilosec over-the-counter p.o. daily.   Patient will be instructed to discontinue the use of his  Lisinopril/hydrochlorothiazide combination until he has been evaluated again  by his primary care physician.   BRIEF HOSPITAL COURSE:  Patient is a pleasant 77 year old African American  male who was admitted with complaints of tongue and lip swelling which had  caused him some mild respiratory compromise.  He was initially seen as an  outpatient for similar complaint.  He was placed on a prednisone Dosepak but  had worsening of his symptoms which had prompted his visit to the ED.  He  was admitted for observation and was placed on high dose IV steroids as well  as H1 and H2 antagonists.  Patient did well with resolution of his symptoms.  Near the time of discharge he was not having any complaints of tongue  swelling, lip swelling or difficulty swallowing or breathing.   DISCHARGE PHYSICAL EXAMINATION:  VITAL SIGNS:  Afebrile, vital signs were  stable.  HEENT:  Oropharynx was clear.  Tongue was normal size and appearance.  No  lip swelling.  Pupils were equal and reactive.  NECK:  He had no lymphadenopathy.  LUNGS:  Clear.  CARDIOVASCULAR:  Regular rate.  ABDOMEN:  Soft, no peripheral edema noted.   OTHER PERTINENT FINDINGS:  Hemoglobin 10.4.  BUN and creatinine 24 and 1.7.  Patient also had recently undergone a right inguinal hernia repair and  surgical site is clean and dry.   CONDITION ON DISCHARGE:  Improved.   Patient is instructed to return to the emergency department if he had  recurrence of his symptoms of tongue swelling, lip swelling and/or  respiratory compromise.       RLK/MEDQ  D:  02/04/2005  T:  02/04/2005  Job:  [id number]

## 2011-01-07 ENCOUNTER — Other Ambulatory Visit: Payer: Self-pay | Admitting: Family

## 2011-01-07 ENCOUNTER — Encounter (HOSPITAL_BASED_OUTPATIENT_CLINIC_OR_DEPARTMENT_OTHER): Payer: Medicare PPO | Admitting: Hematology & Oncology

## 2011-01-07 DIAGNOSIS — C9001 Multiple myeloma in remission: Secondary | ICD-10-CM

## 2011-01-07 DIAGNOSIS — Z5111 Encounter for antineoplastic chemotherapy: Secondary | ICD-10-CM

## 2011-01-07 DIAGNOSIS — Z5112 Encounter for antineoplastic immunotherapy: Secondary | ICD-10-CM

## 2011-01-07 DIAGNOSIS — R197 Diarrhea, unspecified: Secondary | ICD-10-CM

## 2011-01-07 DIAGNOSIS — C9000 Multiple myeloma not having achieved remission: Secondary | ICD-10-CM

## 2011-01-07 LAB — COMPREHENSIVE METABOLIC PANEL
AST: 16 U/L (ref 0–37)
Albumin: 4.1 g/dL (ref 3.5–5.2)
Alkaline Phosphatase: 49 U/L (ref 39–117)
Potassium: 3.9 mEq/L (ref 3.5–5.3)
Sodium: 138 mEq/L (ref 135–145)
Total Bilirubin: 0.6 mg/dL (ref 0.3–1.2)
Total Protein: 6 g/dL (ref 6.0–8.3)

## 2011-01-07 LAB — CBC WITH DIFFERENTIAL (CANCER CENTER ONLY)
Eosinophils Absolute: 0.1 10*3/uL (ref 0.0–0.5)
HCT: 32.7 % — ABNORMAL LOW (ref 38.7–49.9)
HGB: 11.3 g/dL — ABNORMAL LOW (ref 13.0–17.1)
LYMPH%: 16.4 % (ref 14.0–48.0)
MCV: 93 fL (ref 82–98)
MONO#: 0.5 10*3/uL (ref 0.1–0.9)
NEUT%: 63.5 % (ref 40.0–80.0)
RBC: 3.5 10*6/uL — ABNORMAL LOW (ref 4.20–5.70)
WBC: 3.2 10*3/uL — ABNORMAL LOW (ref 4.0–10.0)

## 2011-01-09 ENCOUNTER — Other Ambulatory Visit: Payer: Self-pay | Admitting: Hematology & Oncology

## 2011-01-09 ENCOUNTER — Encounter (HOSPITAL_BASED_OUTPATIENT_CLINIC_OR_DEPARTMENT_OTHER): Payer: Medicare PPO | Admitting: Hematology & Oncology

## 2011-01-09 DIAGNOSIS — R197 Diarrhea, unspecified: Secondary | ICD-10-CM

## 2011-01-09 DIAGNOSIS — G4700 Insomnia, unspecified: Secondary | ICD-10-CM

## 2011-01-09 DIAGNOSIS — C9000 Multiple myeloma not having achieved remission: Secondary | ICD-10-CM

## 2011-01-09 LAB — CMP (CANCER CENTER ONLY)
AST: 17 U/L (ref 11–38)
Albumin: 4.5 g/dL (ref 3.3–5.5)
Alkaline Phosphatase: 60 U/L (ref 26–84)
BUN, Bld: 24 mg/dL — ABNORMAL HIGH (ref 7–22)
Calcium: 9.4 mg/dL (ref 8.0–10.3)
Creat: 3.7 mg/dl — ABNORMAL HIGH (ref 0.6–1.2)
Glucose, Bld: 136 mg/dL — ABNORMAL HIGH (ref 73–118)

## 2011-01-14 ENCOUNTER — Other Ambulatory Visit: Payer: Self-pay | Admitting: Hematology & Oncology

## 2011-01-14 ENCOUNTER — Encounter (HOSPITAL_BASED_OUTPATIENT_CLINIC_OR_DEPARTMENT_OTHER): Payer: Medicare PPO | Admitting: Hematology & Oncology

## 2011-01-14 DIAGNOSIS — Z5112 Encounter for antineoplastic immunotherapy: Secondary | ICD-10-CM

## 2011-01-14 LAB — BASIC METABOLIC PANEL - CANCER CENTER ONLY
BUN, Bld: 24 mg/dL — ABNORMAL HIGH (ref 7–22)
Chloride: 110 mEq/L — ABNORMAL HIGH (ref 98–108)
Creat: 1.5 mg/dl — ABNORMAL HIGH (ref 0.6–1.2)
Glucose, Bld: 152 mg/dL — ABNORMAL HIGH (ref 73–118)
Potassium: 4.5 mEq/L (ref 3.3–4.7)

## 2011-01-14 LAB — CBC WITH DIFFERENTIAL (CANCER CENTER ONLY)
BASO#: 0 10*3/uL (ref 0.0–0.2)
EOS%: 2.6 % (ref 0.0–7.0)
Eosinophils Absolute: 0.1 10*3/uL (ref 0.0–0.5)
HCT: 32.7 % — ABNORMAL LOW (ref 38.7–49.9)
HGB: 11.4 g/dL — ABNORMAL LOW (ref 13.0–17.1)
LYMPH#: 0.5 10*3/uL — ABNORMAL LOW (ref 0.9–3.3)
MCH: 32.2 pg (ref 28.0–33.4)
MCHC: 34.9 g/dL (ref 32.0–35.9)
NEUT#: 2 10*3/uL (ref 1.5–6.5)
NEUT%: 66.4 % (ref 40.0–80.0)
RBC: 3.54 10*6/uL — ABNORMAL LOW (ref 4.20–5.70)

## 2011-01-28 ENCOUNTER — Encounter (HOSPITAL_BASED_OUTPATIENT_CLINIC_OR_DEPARTMENT_OTHER): Payer: Medicare PPO | Admitting: Hematology & Oncology

## 2011-01-28 ENCOUNTER — Other Ambulatory Visit: Payer: Self-pay | Admitting: Hematology & Oncology

## 2011-01-28 DIAGNOSIS — Z5111 Encounter for antineoplastic chemotherapy: Secondary | ICD-10-CM

## 2011-01-28 DIAGNOSIS — C9000 Multiple myeloma not having achieved remission: Secondary | ICD-10-CM

## 2011-01-28 DIAGNOSIS — G4700 Insomnia, unspecified: Secondary | ICD-10-CM

## 2011-01-28 LAB — CBC WITH DIFFERENTIAL (CANCER CENTER ONLY)
BASO%: 0.3 % (ref 0.0–2.0)
EOS%: 3.7 % (ref 0.0–7.0)
LYMPH%: 15.5 % (ref 14.0–48.0)
MCH: 32.2 pg (ref 28.0–33.4)
MCHC: 34.5 g/dL (ref 32.0–35.9)
MCV: 93 fL (ref 82–98)
MONO%: 17.5 % — ABNORMAL HIGH (ref 0.0–13.0)
NEUT#: 2.2 10*3/uL (ref 1.5–6.5)
Platelets: 165 10*3/uL (ref 145–400)
RDW: 14.6 % (ref 11.1–15.7)

## 2011-01-30 LAB — SPEP & IFE WITH QIG
Alpha-2-Globulin: 10.3 % (ref 7.1–11.8)
Beta Globulin: 5.5 % (ref 4.7–7.2)
Gamma Globulin: 10.9 % — ABNORMAL LOW (ref 11.1–18.8)
IgA: 127 mg/dL (ref 68–379)
IgG (Immunoglobin G), Serum: 746 mg/dL (ref 650–1600)
Total Protein, Serum Electrophoresis: 6.2 g/dL (ref 6.0–8.3)

## 2011-01-30 LAB — COMPREHENSIVE METABOLIC PANEL
ALT: 12 U/L (ref 0–53)
Alkaline Phosphatase: 46 U/L (ref 39–117)
Creatinine, Ser: 1.41 mg/dL — ABNORMAL HIGH (ref 0.50–1.35)
Sodium: 140 mEq/L (ref 135–145)
Total Bilirubin: 0.6 mg/dL (ref 0.3–1.2)
Total Protein: 6.2 g/dL (ref 6.0–8.3)

## 2011-01-30 LAB — KAPPA/LAMBDA LIGHT CHAINS
Kappa free light chain: 1.45 mg/dL (ref 0.33–1.94)
Kappa:Lambda Ratio: 0.08 — ABNORMAL LOW (ref 0.26–1.65)

## 2011-02-04 ENCOUNTER — Other Ambulatory Visit: Payer: Self-pay | Admitting: Hematology & Oncology

## 2011-02-04 ENCOUNTER — Encounter (HOSPITAL_BASED_OUTPATIENT_CLINIC_OR_DEPARTMENT_OTHER): Payer: Medicare PPO | Admitting: Hematology & Oncology

## 2011-02-04 DIAGNOSIS — C9000 Multiple myeloma not having achieved remission: Secondary | ICD-10-CM

## 2011-02-04 DIAGNOSIS — Z5111 Encounter for antineoplastic chemotherapy: Secondary | ICD-10-CM

## 2011-02-04 LAB — CBC WITH DIFFERENTIAL (CANCER CENTER ONLY)
BASO#: 0 10*3/uL (ref 0.0–0.2)
EOS%: 2.6 % (ref 0.0–7.0)
HCT: 31.7 % — ABNORMAL LOW (ref 38.7–49.9)
HGB: 11.2 g/dL — ABNORMAL LOW (ref 13.0–17.1)
LYMPH#: 0.6 10*3/uL — ABNORMAL LOW (ref 0.9–3.3)
MCHC: 35.3 g/dL (ref 32.0–35.9)
MONO#: 0.7 10*3/uL (ref 0.1–0.9)
NEUT%: 66.4 % (ref 40.0–80.0)

## 2011-02-04 LAB — BASIC METABOLIC PANEL - CANCER CENTER ONLY
BUN, Bld: 17 mg/dL (ref 7–22)
CO2: 28 mEq/L (ref 18–33)
Chloride: 101 mEq/L (ref 98–108)
Creat: 1.4 mg/dl — ABNORMAL HIGH (ref 0.6–1.2)

## 2011-03-02 ENCOUNTER — Other Ambulatory Visit: Payer: Self-pay | Admitting: Hematology & Oncology

## 2011-03-02 ENCOUNTER — Encounter (HOSPITAL_BASED_OUTPATIENT_CLINIC_OR_DEPARTMENT_OTHER): Payer: Medicare PPO | Admitting: Hematology & Oncology

## 2011-03-02 DIAGNOSIS — C9000 Multiple myeloma not having achieved remission: Secondary | ICD-10-CM

## 2011-03-02 LAB — CBC WITH DIFFERENTIAL (CANCER CENTER ONLY)
EOS%: 2.4 % (ref 0.0–7.0)
MCH: 33.6 pg — ABNORMAL HIGH (ref 28.0–33.4)
MCHC: 35.3 g/dL (ref 32.0–35.9)
MONO%: 13.7 % — ABNORMAL HIGH (ref 0.0–13.0)
NEUT#: 2.9 10*3/uL (ref 1.5–6.5)
Platelets: 150 10*3/uL (ref 145–400)
RDW: 14.8 % (ref 11.1–15.7)

## 2011-03-04 LAB — COMPREHENSIVE METABOLIC PANEL
ALT: 17 U/L (ref 0–53)
CO2: 24 mEq/L (ref 19–32)
Calcium: 9 mg/dL (ref 8.4–10.5)
Chloride: 105 mEq/L (ref 96–112)
Creatinine, Ser: 1.46 mg/dL — ABNORMAL HIGH (ref 0.50–1.35)
Glucose, Bld: 157 mg/dL — ABNORMAL HIGH (ref 70–99)
Sodium: 139 mEq/L (ref 135–145)
Total Bilirubin: 0.6 mg/dL (ref 0.3–1.2)
Total Protein: 6.5 g/dL (ref 6.0–8.3)

## 2011-03-04 LAB — SPEP & IFE WITH QIG
Alpha-2-Globulin: 10.5 % (ref 7.1–11.8)
Beta 2: 4.4 % (ref 3.2–6.5)
Beta Globulin: 6.1 % (ref 4.7–7.2)
Gamma Globulin: 11.6 % (ref 11.1–18.8)
IgA: 147 mg/dL (ref 68–379)
IgG (Immunoglobin G), Serum: 820 mg/dL (ref 650–1600)

## 2011-03-04 LAB — KAPPA/LAMBDA LIGHT CHAINS
Kappa free light chain: 0.71 mg/dL (ref 0.33–1.94)
Kappa:Lambda Ratio: 0.02 — ABNORMAL LOW (ref 0.26–1.65)
Lambda Free Lght Chn: 28.5 mg/dL — ABNORMAL HIGH (ref 0.57–2.63)

## 2011-03-04 LAB — LACTATE DEHYDROGENASE: LDH: 150 U/L (ref 94–250)

## 2011-03-24 ENCOUNTER — Other Ambulatory Visit: Payer: Self-pay | Admitting: Hematology & Oncology

## 2011-03-24 ENCOUNTER — Encounter (HOSPITAL_BASED_OUTPATIENT_CLINIC_OR_DEPARTMENT_OTHER): Payer: Medicare PPO | Admitting: Hematology & Oncology

## 2011-03-24 DIAGNOSIS — Z5111 Encounter for antineoplastic chemotherapy: Secondary | ICD-10-CM

## 2011-03-24 DIAGNOSIS — C9000 Multiple myeloma not having achieved remission: Secondary | ICD-10-CM

## 2011-03-24 LAB — CMP (CANCER CENTER ONLY)
ALT(SGPT): 21 U/L (ref 10–47)
Albumin: 3.7 g/dL (ref 3.3–5.5)
CO2: 27 mEq/L (ref 18–33)
Calcium: 9.2 mg/dL (ref 8.0–10.3)
Chloride: 99 mEq/L (ref 98–108)
Glucose, Bld: 168 mg/dL — ABNORMAL HIGH (ref 73–118)
Potassium: 4.6 mEq/L (ref 3.3–4.7)
Sodium: 138 mEq/L (ref 128–145)
Total Protein: 7 g/dL (ref 6.4–8.1)

## 2011-03-24 LAB — CBC WITH DIFFERENTIAL (CANCER CENTER ONLY)
BASO%: 0.4 % (ref 0.0–2.0)
Eosinophils Absolute: 0.2 10*3/uL (ref 0.0–0.5)
LYMPH#: 0.8 10*3/uL — ABNORMAL LOW (ref 0.9–3.3)
MONO#: 0.5 10*3/uL (ref 0.1–0.9)
Platelets: 153 10*3/uL (ref 145–400)
RBC: 3.63 10*6/uL — ABNORMAL LOW (ref 4.20–5.70)
RDW: 14.3 % (ref 11.1–15.7)
WBC: 4.5 10*3/uL (ref 4.0–10.0)

## 2011-03-26 LAB — KAPPA/LAMBDA LIGHT CHAINS: Kappa free light chain: 0.99 mg/dL (ref 0.33–1.94)

## 2011-03-26 LAB — PROTEIN ELECTROPHORESIS, SERUM
Beta 2: 4.1 % (ref 3.2–6.5)
Beta Globulin: 6.4 % (ref 4.7–7.2)
Gamma Globulin: 11.8 % (ref 11.1–18.8)
Total Protein, Serum Electrophoresis: 6.5 g/dL (ref 6.0–8.3)

## 2011-03-26 LAB — LACTATE DEHYDROGENASE: LDH: 163 U/L (ref 94–250)

## 2011-04-01 ENCOUNTER — Ambulatory Visit (HOSPITAL_BASED_OUTPATIENT_CLINIC_OR_DEPARTMENT_OTHER)
Admission: RE | Admit: 2011-04-01 | Discharge: 2011-04-01 | Disposition: A | Discharge status: Home / Self Care | Payer: Medicare PPO | Source: Ambulatory Visit | Attending: Hematology & Oncology | Admitting: Hematology & Oncology

## 2011-04-01 ENCOUNTER — Other Ambulatory Visit: Payer: Self-pay | Admitting: Hematology & Oncology

## 2011-04-01 DIAGNOSIS — M25519 Pain in unspecified shoulder: Secondary | ICD-10-CM | POA: Insufficient documentation

## 2011-04-01 DIAGNOSIS — Z87898 Personal history of other specified conditions: Secondary | ICD-10-CM | POA: Insufficient documentation

## 2011-04-01 DIAGNOSIS — C9000 Multiple myeloma not having achieved remission: Secondary | ICD-10-CM

## 2011-04-01 DIAGNOSIS — M25559 Pain in unspecified hip: Secondary | ICD-10-CM | POA: Insufficient documentation

## 2011-04-23 ENCOUNTER — Other Ambulatory Visit: Payer: Self-pay | Admitting: Hematology & Oncology

## 2011-04-23 ENCOUNTER — Encounter (HOSPITAL_BASED_OUTPATIENT_CLINIC_OR_DEPARTMENT_OTHER): Payer: Medicare PPO | Admitting: Hematology & Oncology

## 2011-04-23 DIAGNOSIS — C9000 Multiple myeloma not having achieved remission: Secondary | ICD-10-CM

## 2011-04-23 DIAGNOSIS — Z5111 Encounter for antineoplastic chemotherapy: Secondary | ICD-10-CM

## 2011-04-23 LAB — CBC WITH DIFFERENTIAL (CANCER CENTER ONLY)
EOS%: 2.1 % (ref 0.0–7.0)
Eosinophils Absolute: 0.1 10*3/uL (ref 0.0–0.5)
LYMPH%: 10.3 % — ABNORMAL LOW (ref 14.0–48.0)
MCH: 34.1 pg — ABNORMAL HIGH (ref 28.0–33.4)
MCHC: 35.4 g/dL (ref 32.0–35.9)
MCV: 96 fL (ref 82–98)
MONO%: 8.1 % (ref 0.0–13.0)
NEUT#: 4.6 10*3/uL (ref 1.5–6.5)
Platelets: 142 10*3/uL — ABNORMAL LOW (ref 145–400)
RBC: 3.52 10*6/uL — ABNORMAL LOW (ref 4.20–5.70)
RDW: 12.8 % (ref 11.1–15.7)

## 2011-04-24 LAB — COMPREHENSIVE METABOLIC PANEL
AST: 17 U/L (ref 0–37)
Alkaline Phosphatase: 59 U/L (ref 39–117)
Glucose, Bld: 212 mg/dL — ABNORMAL HIGH (ref 70–99)
Sodium: 139 mEq/L (ref 135–145)
Total Bilirubin: 0.7 mg/dL (ref 0.3–1.2)
Total Protein: 6.5 g/dL (ref 6.0–8.3)

## 2011-04-24 LAB — KAPPA/LAMBDA LIGHT CHAINS
Kappa free light chain: 1.03 mg/dL (ref 0.33–1.94)
Kappa:Lambda Ratio: 0.02 — ABNORMAL LOW (ref 0.26–1.65)
Lambda Free Lght Chn: 54 mg/dL — ABNORMAL HIGH (ref 0.57–2.63)

## 2011-05-22 ENCOUNTER — Encounter (HOSPITAL_BASED_OUTPATIENT_CLINIC_OR_DEPARTMENT_OTHER): Payer: Medicare PPO | Admitting: Hematology & Oncology

## 2011-05-22 ENCOUNTER — Other Ambulatory Visit: Payer: Self-pay | Admitting: Hematology & Oncology

## 2011-05-22 DIAGNOSIS — Z5111 Encounter for antineoplastic chemotherapy: Secondary | ICD-10-CM

## 2011-05-22 DIAGNOSIS — C9002 Multiple myeloma in relapse: Secondary | ICD-10-CM

## 2011-05-22 LAB — CBC WITH DIFFERENTIAL (CANCER CENTER ONLY)
BASO#: 0 10*3/uL (ref 0.0–0.2)
BASO%: 0.2 % (ref 0.0–2.0)
EOS%: 3.2 % (ref 0.0–7.0)
HGB: 12.5 g/dL — ABNORMAL LOW (ref 13.0–17.1)
LYMPH#: 0.6 10*3/uL — ABNORMAL LOW (ref 0.9–3.3)
MCHC: 35.7 g/dL (ref 32.0–35.9)
MONO%: 11.1 % (ref 0.0–13.0)
NEUT#: 3.1 10*3/uL (ref 1.5–6.5)
Platelets: 154 10*3/uL (ref 145–400)
RDW: 12.6 % (ref 11.1–15.7)

## 2011-05-22 LAB — CMP (CANCER CENTER ONLY)
ALT(SGPT): 11 U/L (ref 10–47)
AST: 20 U/L (ref 11–38)
Albumin: 3.8 g/dL (ref 3.3–5.5)
Alkaline Phosphatase: 58 U/L (ref 26–84)
Calcium: 8.9 mg/dL (ref 8.0–10.3)
Chloride: 105 mEq/L (ref 98–108)
Potassium: 4.1 mEq/L (ref 3.3–4.7)
Sodium: 141 mEq/L (ref 128–145)
Total Protein: 6.8 g/dL (ref 6.4–8.1)

## 2011-05-28 LAB — CBC
MCHC: 33.9
MCV: 92.2
RDW: 13.9

## 2011-05-28 LAB — DIFFERENTIAL
Basophils Relative: 0
Eosinophils Absolute: 0.3
Eosinophils Relative: 5
Monocytes Relative: 14 — ABNORMAL HIGH
Neutrophils Relative %: 69

## 2011-05-28 LAB — CHROMOSOME ANALYSIS, BONE MARROW

## 2011-06-30 ENCOUNTER — Other Ambulatory Visit: Payer: Self-pay | Admitting: Gastroenterology

## 2011-07-01 ENCOUNTER — Telehealth: Payer: Self-pay | Admitting: *Deleted

## 2011-07-01 ENCOUNTER — Telehealth: Payer: Self-pay | Admitting: Hematology & Oncology

## 2011-07-01 NOTE — Telephone Encounter (Signed)
Pt called said had something on tongue per RN pt aware to leave message and she will call him back soon. Pt aware

## 2011-07-03 NOTE — Telephone Encounter (Signed)
Reviewed symptoms with Dr Myna Hidalgo. He recommended warm baking soda rinses as he has not received any recent treatments. He verbalized understanding. To call back if symptoms worsen.

## 2011-07-16 ENCOUNTER — Encounter: Payer: Self-pay | Admitting: Hematology & Oncology

## 2011-07-16 ENCOUNTER — Ambulatory Visit (HOSPITAL_BASED_OUTPATIENT_CLINIC_OR_DEPARTMENT_OTHER): Payer: Medicare PPO | Admitting: Lab

## 2011-07-16 ENCOUNTER — Ambulatory Visit (HOSPITAL_BASED_OUTPATIENT_CLINIC_OR_DEPARTMENT_OTHER): Payer: Medicare PPO | Admitting: Hematology & Oncology

## 2011-07-16 DIAGNOSIS — D631 Anemia in chronic kidney disease: Secondary | ICD-10-CM

## 2011-07-16 DIAGNOSIS — Z5111 Encounter for antineoplastic chemotherapy: Secondary | ICD-10-CM

## 2011-07-16 DIAGNOSIS — C9000 Multiple myeloma not having achieved remission: Secondary | ICD-10-CM

## 2011-07-16 DIAGNOSIS — C9002 Multiple myeloma in relapse: Secondary | ICD-10-CM | POA: Insufficient documentation

## 2011-07-16 HISTORY — DX: Anemia in chronic kidney disease: D63.1

## 2011-07-16 HISTORY — DX: Multiple myeloma in relapse: C90.02

## 2011-07-16 LAB — CBC WITH DIFFERENTIAL (CANCER CENTER ONLY)
BASO%: 0 % (ref 0.0–2.0)
Eosinophils Absolute: 0.1 10*3/uL (ref 0.0–0.5)
LYMPH%: 12.8 % — ABNORMAL LOW (ref 14.0–48.0)
MCH: 31.7 pg (ref 28.0–33.4)
MONO%: 14.4 % — ABNORMAL HIGH (ref 0.0–13.0)
NEUT#: 3.4 10*3/uL (ref 1.5–6.5)
Platelets: 171 10*3/uL (ref 145–400)
RBC: 3.53 10*6/uL — ABNORMAL LOW (ref 4.20–5.70)
RDW: 13.1 % (ref 11.1–15.7)
WBC: 4.8 10*3/uL (ref 4.0–10.0)

## 2011-07-16 MED ORDER — SODIUM CHLORIDE 0.9 % IJ SOLN
10.0000 mL | INTRAMUSCULAR | Status: DC | PRN
  Administered 2011-07-16: 10 mL via INTRAVENOUS
  Filled 2011-07-16: qty 10

## 2011-07-16 MED ORDER — HEPARIN SOD (PORK) LOCK FLUSH 100 UNIT/ML IV SOLN
500.0000 [IU] | Freq: Once | INTRAVENOUS | Status: AC
  Administered 2011-07-16: 500 [IU] via INTRAVENOUS
  Filled 2011-07-16: qty 5

## 2011-07-16 NOTE — Progress Notes (Signed)
This office note has been dictated.

## 2011-07-17 NOTE — Progress Notes (Signed)
CC:   Arthur Valdez, M.D.  DIAGNOSIS:  Recurrent lambda light chain myeloma.  CURRENT THERAPY:  Observation.  INTERIM HISTORY:  Arthur Valdez comes in for his follow-up .  He is doing okay.  We have not had to treat him now probably for a good 8 months. His myeloma studies have shown that his light chains have been going up slowly but surely.  When we last saw him in October, his serum lambda light chain was 72 mg/dL.  He has had no bony pain.  He has had no headache.  He has had no change in bowel or bladder habits.  He has had no fevers, sweats or chills. There have been no rashes.  He had a good Thanksgiving.  He is trying to be careful and watch what he eats.  PHYSICAL EXAMINATION:  General Appearance:  This is a well-developed, well-nourished black gentleman in no obvious distress.  Vital Signs: Show a temperature of 97.3, pulse 72, respiratory rate 18, blood pressure 132/85.  Weight is 243.  Head and Neck Exam:  Shows a normocephalic, atraumatic skull.  There are no ocular or oral lesions. There are no palpable cervical or November 29, 2012supraclavicular lymph nodes.  Lungs:  Clear bilaterally.  Cardiac Exam:  Regular rate and rhythm with a normal S1 and S2.  There are no murmurs, rubs or bruits. Abdominal Exam:  Soft with good bowel sounds.  There is no palpable abdominal mass.  There is no palpable hepatosplenomegaly.  Extremities: Show no clubbing, cyanosis or edema.  Skin Exam:  No rashes, ecchymosis, or petechia.  Neurological Exam:  No focal neurological deficits.  LABORATORY STUDIES:  White cell count is 4.8, hemoglobin 11.2, hemoglobin 32.4, platelet count 171.  Sodium 138, potassium 3.9, BUN 21, creatinine 1.38.  IMPRESSION:  Arthur Valdez is a 77 year old gentleman with a history of recurrent lambda light chain myeloma.  He has already had a stem cell transplant.  He had this at [facility].  He did well with this.  Again, he is asymptomatic with respect to this  recurrence.  I am most worried about his kidney function.  His kidney function looks real good.  I think we can get him through the holidays now.  I want to see him back in about 6 weeks' time.  He does have a Port-A-Cath so we will have to get that flushed.  I suspect that next year we will need to embark upon therapy.  We could certainly consider Kyprolis for him.    ______________________________ [name], M.D. PRE/MEDQ  D:  07/16/2011  T:  07/17/2011  Job:  [id number]  ADDENDUM: LAMBDA light chain is 120 mg/dL.

## 2011-07-20 LAB — COMPREHENSIVE METABOLIC PANEL
ALT: 11 U/L (ref 0–53)
AST: 17 U/L (ref 0–37)
Albumin: 4.1 g/dL (ref 3.5–5.2)
Alkaline Phosphatase: 63 U/L (ref 39–117)
Glucose, Bld: 121 mg/dL — ABNORMAL HIGH (ref 70–99)
Potassium: 3.9 mEq/L (ref 3.5–5.3)
Sodium: 138 mEq/L (ref 135–145)
Total Bilirubin: 0.7 mg/dL (ref 0.3–1.2)
Total Protein: 6.5 g/dL (ref 6.0–8.3)

## 2011-07-20 LAB — IGG, IGA, IGM: IgG (Immunoglobin G), Serum: 762 mg/dL (ref 650–1600)

## 2011-07-20 LAB — KAPPA/LAMBDA LIGHT CHAINS
Kappa free light chain: 1.17 mg/dL (ref 0.33–1.94)
Lambda Free Lght Chn: 120 mg/dL — ABNORMAL HIGH (ref 0.57–2.63)

## 2011-07-20 LAB — PROTEIN ELECTROPHORESIS, SERUM
Alpha-2-Globulin: 12.8 % — ABNORMAL HIGH (ref 7.1–11.8)
Beta Globulin: 6 % (ref 4.7–7.2)
Gamma Globulin: 11.5 % (ref 11.1–18.8)

## 2011-08-12 ENCOUNTER — Ambulatory Visit (INDEPENDENT_AMBULATORY_CARE_PROVIDER_SITE_OTHER): Payer: Medicare PPO

## 2011-08-12 DIAGNOSIS — J209 Acute bronchitis, unspecified: Secondary | ICD-10-CM

## 2011-08-12 DIAGNOSIS — K140 Glossitis: Secondary | ICD-10-CM

## 2011-08-12 DIAGNOSIS — R05 Cough: Secondary | ICD-10-CM

## 2011-08-12 DIAGNOSIS — B370 Candidal stomatitis: Secondary | ICD-10-CM

## 2011-08-12 DIAGNOSIS — R509 Fever, unspecified: Secondary | ICD-10-CM

## 2011-08-14 ENCOUNTER — Other Ambulatory Visit: Payer: Self-pay | Admitting: *Deleted

## 2011-08-14 DIAGNOSIS — F419 Anxiety disorder, unspecified: Secondary | ICD-10-CM

## 2011-08-14 MED ORDER — LORAZEPAM 2 MG PO TABS: 2.0000 mg | ORAL_TABLET | Freq: Two times a day (BID) | ORAL | Status: DC | PRN

## 2011-08-25 ENCOUNTER — Telehealth: Payer: Self-pay | Admitting: Hematology & Oncology

## 2011-08-25 NOTE — Telephone Encounter (Signed)
Pt's wife called and cx 08/26/11 apt due to pt having the flu.  She stated pt will call back next week to resch

## 2011-08-26 ENCOUNTER — Other Ambulatory Visit: Payer: Medicare PPO | Admitting: Lab

## 2011-08-26 ENCOUNTER — Ambulatory Visit: Payer: Medicare PPO | Admitting: Hematology & Oncology

## 2011-09-04 ENCOUNTER — Ambulatory Visit (HOSPITAL_BASED_OUTPATIENT_CLINIC_OR_DEPARTMENT_OTHER)
Admission: RE | Admit: 2011-09-04 | Discharge: 2011-09-04 | Disposition: A | Discharge status: Home / Self Care | Payer: Medicare PPO | Source: Ambulatory Visit | Attending: Hematology & Oncology | Admitting: Hematology & Oncology

## 2011-09-04 ENCOUNTER — Ambulatory Visit (HOSPITAL_BASED_OUTPATIENT_CLINIC_OR_DEPARTMENT_OTHER): Payer: Medicare PPO | Admitting: Hematology & Oncology

## 2011-09-04 ENCOUNTER — Other Ambulatory Visit: Payer: Medicare PPO | Admitting: Lab

## 2011-09-04 VITALS — BP 125/82 | HR 68 | Temp 98.0°F | Ht 71.0 in | Wt 232.0 lb

## 2011-09-04 DIAGNOSIS — C9000 Multiple myeloma not having achieved remission: Secondary | ICD-10-CM

## 2011-09-04 DIAGNOSIS — R093 Abnormal sputum: Secondary | ICD-10-CM | POA: Insufficient documentation

## 2011-09-04 DIAGNOSIS — C9002 Multiple myeloma in relapse: Secondary | ICD-10-CM

## 2011-09-04 DIAGNOSIS — R059 Cough, unspecified: Secondary | ICD-10-CM | POA: Insufficient documentation

## 2011-09-04 DIAGNOSIS — R062 Wheezing: Secondary | ICD-10-CM

## 2011-09-04 DIAGNOSIS — R05 Cough: Secondary | ICD-10-CM

## 2011-09-04 DIAGNOSIS — J40 Bronchitis, not specified as acute or chronic: Secondary | ICD-10-CM

## 2011-09-04 LAB — CBC WITH DIFFERENTIAL (CANCER CENTER ONLY)
BASO%: 0.2 % (ref 0.0–2.0)
Eosinophils Absolute: 0.1 10*3/uL (ref 0.0–0.5)
LYMPH%: 19.3 % (ref 14.0–48.0)
MCH: 31.6 pg (ref 28.0–33.4)
MCV: 92 fL (ref 82–98)
MONO%: 11.8 % (ref 0.0–13.0)
Platelets: 171 10*3/uL (ref 145–400)
RDW: 14.8 % (ref 11.1–15.7)

## 2011-09-04 LAB — CMP (CANCER CENTER ONLY)
Alkaline Phosphatase: 59 U/L (ref 26–84)
Creat: 1.3 mg/dl — ABNORMAL HIGH (ref 0.6–1.2)
Glucose, Bld: 132 mg/dL — ABNORMAL HIGH (ref 73–118)
Sodium: 138 mEq/L (ref 128–145)
Total Bilirubin: 0.6 mg/dl (ref 0.20–1.60)
Total Protein: 6.8 g/dL (ref 6.4–8.1)

## 2011-09-04 MED ORDER — CEFDINIR 300 MG PO CAPS: 300.0000 mg | ORAL_CAPSULE | Freq: Two times a day (BID) | ORAL | Status: AC

## 2011-09-04 NOTE — Progress Notes (Signed)
This office note has been dictated.

## 2011-09-05 NOTE — Progress Notes (Signed)
CC:   Arthur Valdez, M.D.  DIAGNOSIS:  Recurrent lambda light chain myeloma.  CURRENT THERAPY:  Sedation.  INTERVAL HISTORY:  Arthur Valdez comes in the followup.  Unfortunately, he is not feeling too well.  He says he has had some congestion.  He has been taking over-the-counter medications.  We did go ahead and do a chest x-ray on him.  This was done because of his myeloma history and the fact that he is somewhat immunocompromised. Thankfully, the chest x-ray only showed bronchitis but no pneumonia.  I did go ahead and prescribe some Omnicef (300 mg p.o. b.i.d. x7 days) to try to help him out.  I also gave him a DuoNeb nebulizer for wheezing that he had.  He did see Dr. Marissa Valdez out at [facility] recently.  Dr. Marissa Valdez felt that it was time for him to be treated.  This is something I definitely agree with.  We were trying to get Arthur Valdez through the holidays.  It has been probably about 10 months since he had treatment.  Arthur Valdez has been somewhat "harassed" by [facility] system for bills.  There is a lot of confusion as to what he really owes.  When we last saw him, his lambda light chain was 120 mg/dL.  There was no monoclonal spike in his serum.  He did have an IgG level of 762 mg/dL.  He is not complaining of any kind of pain.  He is having no problems going to the bathroom.  He has had a good appetite.  There has been no leg swelling.  PHYSICAL EXAM:  General:  This is a well-developed, well-nourished, black gentleman in no obvious distress.  Vital Signs:  98, pulse 68, respiratory rate 16, blood pressure 125/82.  Weight is 232.  Head and Neck Exam:  Normocephalic, atraumatic skull.  There are no ocular or oral lesions.  There are no palpable cervical or supraclavicular lymph nodes.  Lungs:  Wheezes bilaterally.  There are some crackles over on the right side.  Cardiac Exam:  Regular rate and rhythm with normal S1 and S2.  There are no murmurs, rubs, or bruits.  Abdominal  Exam:  Soft with good bowel sounds.  There is no palpable abdominal mass.  There is no fluid wave.  There is no palpable hepatosplenomegaly.  Back Exam:  No tenderness over the spine, ribs, or hips.  Extremities:  No clubbing, cyanosis, or edema.  Neurological Exam:  No focal neurological deficits.  LABORATORY STUDIES:  White count is 4.8, hemoglobin 10.9, hematocrit 31.8, platelet count 171.  His sodium is 138, potassium 3.8, BUN 16, creatinine 1.3, calcium is 9.1.  IMPRESSION:  Arthur Valdez is a 78 year old gentleman with recurrent light chain myeloma.  He has lambda light chain myeloma. He now is ready for treatment.  Again, we have held off quite a long time.  He really had a hard time with Cytoxan/Velcade that we treated him with early in the year.  I always look at his renal function as an indicator for how he is doing. His renal function is holding steady right now.  We did get baseline myeloma studies on him so that we know where he is at the start of treatment.  He does not need any IVIG from my point of view.  I think if he does run into problems in the future with recurrent pulmonary infections or sinusitis, then we might have to consider IVIG.  We will also do Zometa with him.  I think this may provide some element of protection for his bones.  I am also going to consider Decadron for him, too.  Arthur Valdez is trying his best.  He has really done quite well considering all that he has been through.  We will start treatment next week.  I will see him back in February when he starts his 2nd cycle of treatment.  I spent a good 45 minutes or so with him.  We spent a lot of time talking about his financial issues with the [facility] system.  ADDENDUM:  I did talk to Arthur Valdez at length about his visit at [facility].  I tried to encourage him to consider a clinical trial.  He and his wife talked about this after their visit.  He just does not feel comfortable going into a  clinical trial right now.  He is not confident of taking Revlimid as he felt that this caused a lot of problems for him in the past.  I told him to try to keep an open mind regarding clinical trials as there are always improvements and he may not have a reaction to Revlimid if he was to try it again.  Again, Arthur Valdez really is not interested in a trial and he just wants to stay in town right now.    ______________________________ [name], M.D. PRE/MEDQ  D:  09/04/2011  T:  09/05/2011  Job:  [id number]

## 2011-09-09 ENCOUNTER — Ambulatory Visit (HOSPITAL_BASED_OUTPATIENT_CLINIC_OR_DEPARTMENT_OTHER): Payer: Medicare PPO

## 2011-09-09 VITALS — BP 148/93 | HR 99 | Temp 97.1°F

## 2011-09-09 DIAGNOSIS — Z5111 Encounter for antineoplastic chemotherapy: Secondary | ICD-10-CM

## 2011-09-09 DIAGNOSIS — C9002 Multiple myeloma in relapse: Secondary | ICD-10-CM

## 2011-09-09 DIAGNOSIS — C9000 Multiple myeloma not having achieved remission: Secondary | ICD-10-CM

## 2011-09-09 MED ORDER — HEPARIN SOD (PORK) LOCK FLUSH 100 UNIT/ML IV SOLN
500.0000 [IU] | Freq: Once | INTRAVENOUS | Status: AC | PRN
  Administered 2011-09-09: 500 [IU]
  Filled 2011-09-09: qty 5

## 2011-09-09 MED ORDER — SODIUM CHLORIDE 0.9 % IV SOLN
Freq: Once | INTRAVENOUS | Status: AC
  Administered 2011-09-09: 13:00:00 via INTRAVENOUS

## 2011-09-09 MED ORDER — SODIUM CHLORIDE 0.9 % IJ SOLN
10.0000 mL | INTRAMUSCULAR | Status: DC | PRN
  Administered 2011-09-09: 10 mL
  Filled 2011-09-09: qty 10

## 2011-09-09 MED ORDER — ONDANSETRON 8 MG/50ML IVPB (CHCC)
8.0000 mg | Freq: Once | INTRAVENOUS | Status: AC
  Administered 2011-09-09: 8 mg via INTRAVENOUS
  Filled 2011-09-09: qty 8

## 2011-09-09 MED ORDER — SODIUM CHLORIDE 0.9 % IV SOLN: Freq: Once | INTRAVENOUS | Status: DC

## 2011-09-09 MED ORDER — DEXTROSE 5 % IV SOLN
20.0000 mg/m2 | Freq: Once | INTRAVENOUS | Status: AC
  Administered 2011-09-09: 44 mg via INTRAVENOUS
  Filled 2011-09-09: qty 22

## 2011-09-09 MED ORDER — PROCHLORPERAZINE MALEATE 10 MG PO TABS: 10.0000 mg | ORAL_TABLET | Freq: Four times a day (QID) | ORAL | Status: DC | PRN

## 2011-09-09 MED ORDER — DEXAMETHASONE SODIUM PHOSPHATE 10 MG/ML IJ SOLN
40.0000 mg | Freq: Once | INTRAMUSCULAR | Status: AC
  Administered 2011-09-09: 40 mg via INTRAVENOUS

## 2011-09-10 ENCOUNTER — Ambulatory Visit (HOSPITAL_BASED_OUTPATIENT_CLINIC_OR_DEPARTMENT_OTHER): Payer: Medicare PPO

## 2011-09-10 VITALS — BP 145/89 | Temp 97.0°F

## 2011-09-10 DIAGNOSIS — Z5112 Encounter for antineoplastic immunotherapy: Secondary | ICD-10-CM

## 2011-09-10 DIAGNOSIS — C9002 Multiple myeloma in relapse: Secondary | ICD-10-CM

## 2011-09-10 DIAGNOSIS — C9000 Multiple myeloma not having achieved remission: Secondary | ICD-10-CM

## 2011-09-10 MED ORDER — ONDANSETRON 8 MG/50ML IVPB (CHCC)
8.0000 mg | Freq: Once | INTRAVENOUS | Status: AC
  Administered 2011-09-10: 8 mg via INTRAVENOUS
  Filled 2011-09-10: qty 8

## 2011-09-10 MED ORDER — DEXAMETHASONE SODIUM PHOSPHATE 10 MG/ML IJ SOLN
10.0000 mg | Freq: Once | INTRAMUSCULAR | Status: AC
  Administered 2011-09-10: 10 mg via INTRAVENOUS

## 2011-09-10 MED ORDER — SODIUM CHLORIDE 0.9 % IV SOLN
Freq: Once | INTRAVENOUS | Status: AC
  Administered 2011-09-10: 14:00:00 via INTRAVENOUS

## 2011-09-10 MED ORDER — HEPARIN SOD (PORK) LOCK FLUSH 100 UNIT/ML IV SOLN
500.0000 [IU] | Freq: Once | INTRAVENOUS | Status: AC | PRN
  Administered 2011-09-10: 500 [IU]
  Filled 2011-09-10: qty 5

## 2011-09-10 MED ORDER — SODIUM CHLORIDE 0.9 % IV SOLN: Freq: Once | INTRAVENOUS | Status: DC

## 2011-09-10 MED ORDER — SODIUM CHLORIDE 0.9 % IJ SOLN
10.0000 mL | INTRAMUSCULAR | Status: DC | PRN
  Administered 2011-09-10: 10 mL
  Filled 2011-09-10: qty 10

## 2011-09-10 MED ORDER — DEXTROSE 5 % IV SOLN
20.0000 mg/m2 | Freq: Once | INTRAVENOUS | Status: AC
  Administered 2011-09-10: 44 mg via INTRAVENOUS
  Filled 2011-09-10: qty 22

## 2011-09-16 ENCOUNTER — Other Ambulatory Visit (HOSPITAL_BASED_OUTPATIENT_CLINIC_OR_DEPARTMENT_OTHER): Payer: Medicare PPO | Admitting: Lab

## 2011-09-16 ENCOUNTER — Ambulatory Visit (HOSPITAL_BASED_OUTPATIENT_CLINIC_OR_DEPARTMENT_OTHER): Payer: Medicare PPO

## 2011-09-16 VITALS — BP 129/77 | HR 101 | Temp 98.0°F

## 2011-09-16 DIAGNOSIS — J40 Bronchitis, not specified as acute or chronic: Secondary | ICD-10-CM

## 2011-09-16 DIAGNOSIS — Z5112 Encounter for antineoplastic immunotherapy: Secondary | ICD-10-CM

## 2011-09-16 DIAGNOSIS — C9002 Multiple myeloma in relapse: Secondary | ICD-10-CM

## 2011-09-16 LAB — CBC WITH DIFFERENTIAL (CANCER CENTER ONLY)
BASO#: 0 10*3/uL (ref 0.0–0.2)
Eosinophils Absolute: 0.1 10*3/uL (ref 0.0–0.5)
HGB: 10.3 g/dL — ABNORMAL LOW (ref 13.0–17.1)
LYMPH%: 13 % — ABNORMAL LOW (ref 14.0–48.0)
MCH: 31.2 pg (ref 28.0–33.4)
MCV: 91 fL (ref 82–98)
MONO#: 0.8 10*3/uL (ref 0.1–0.9)
NEUT#: 5 10*3/uL (ref 1.5–6.5)
Platelets: 153 10*3/uL (ref 145–400)
RBC: 3.3 10*6/uL — ABNORMAL LOW (ref 4.20–5.70)
WBC: 6.8 10*3/uL (ref 4.0–10.0)

## 2011-09-16 MED ORDER — HEPARIN SOD (PORK) LOCK FLUSH 100 UNIT/ML IV SOLN
500.0000 [IU] | Freq: Once | INTRAVENOUS | Status: AC | PRN
  Administered 2011-09-16: 500 [IU]
  Filled 2011-09-16: qty 5

## 2011-09-16 MED ORDER — ZOLEDRONIC ACID 4 MG/5ML IV CONC
3.5000 mg | Freq: Once | INTRAVENOUS | Status: AC
  Administered 2011-09-16: 3.5 mg via INTRAVENOUS
  Filled 2011-09-16: qty 4.38

## 2011-09-16 MED ORDER — SODIUM CHLORIDE 0.9 % IV SOLN
Freq: Once | INTRAVENOUS | Status: AC
  Administered 2011-09-16: 12:00:00 via INTRAVENOUS

## 2011-09-16 MED ORDER — DEXAMETHASONE SODIUM PHOSPHATE 10 MG/ML IJ SOLN
40.0000 mg | Freq: Once | INTRAMUSCULAR | Status: AC
  Administered 2011-09-16: 40 mg via INTRAVENOUS

## 2011-09-16 MED ORDER — SODIUM CHLORIDE 0.9 % IV SOLN
Freq: Once | INTRAVENOUS | Status: AC
  Administered 2011-09-16: 13:00:00 via INTRAVENOUS

## 2011-09-16 MED ORDER — SODIUM CHLORIDE 0.9 % IJ SOLN
10.0000 mL | INTRAMUSCULAR | Status: DC | PRN
  Administered 2011-09-16: 10 mL
  Filled 2011-09-16: qty 10

## 2011-09-16 MED ORDER — ONDANSETRON 8 MG/50ML IVPB (CHCC)
8.0000 mg | Freq: Once | INTRAVENOUS | Status: AC
  Administered 2011-09-16: 8 mg via INTRAVENOUS
  Filled 2011-09-16: qty 8

## 2011-09-16 MED ORDER — DEXTROSE 5 % IV SOLN
20.0000 mg/m2 | Freq: Once | INTRAVENOUS | Status: AC
  Administered 2011-09-16: 44 mg via INTRAVENOUS
  Filled 2011-09-16: qty 22

## 2011-09-16 NOTE — Patient Instructions (Signed)
[facility] Discharge Instructions for Patients Receiving Chemotherapy  Today you received the following chemotherapy agentsKyprolis  To help prevent nausea and vomiting after your treatment, we encourage you to take your nausea medication.  Take Acyclovir on a continuous basis per dr. Myna Hidalgo.   If you develop nausea and vomiting that is not controlled by your nausea medication, call the clinic. If it is after clinic hours your family physician or the after hours number for the clinic or go to the Emergency Department.   BELOW ARE SYMPTOMS THAT SHOULD BE REPORTED IMMEDIATELY:  *FEVER GREATER THAN 100.5 F  *CHILLS WITH OR WITHOUT FEVER  NAUSEA AND VOMITING THAT IS NOT CONTROLLED WITH YOUR NAUSEA MEDICATION  *UNUSUAL SHORTNESS OF BREATH  *UNUSUAL BRUISING OR BLEEDING  TENDERNESS IN MOUTH AND THROAT WITH OR WITHOUT PRESENCE OF ULCERS  *URINARY PROBLEMS  *BOWEL PROBLEMS  UNUSUAL RASH Items with * indicate a potential emergency and should be followed up as soon as possible.  One of the nurses will contact you 24 hours after your treatment. Please let the nurse know about any problems that you may have experienced. Feel free to call the clinic you have any questions or concerns. The clinic phone number is [phone].   I have been informed and understand all the instructions given to me. I know to contact the clinic, my physician, or go to the Emergency Department if any problems should occur. I do not have any questions at this time, but understand that I may call the clinic during office hours   should I have any questions or need assistance in obtaining follow up care.    __________________________________________  _____________  __________ Signature of Patient or Authorized Representative            Date                   Time    __________________________________________ Nurse's Signature

## 2011-09-17 ENCOUNTER — Ambulatory Visit (HOSPITAL_BASED_OUTPATIENT_CLINIC_OR_DEPARTMENT_OTHER): Payer: Medicare PPO

## 2011-09-17 ENCOUNTER — Other Ambulatory Visit: Payer: Self-pay | Admitting: Oncology

## 2011-09-17 VITALS — BP 148/78 | HR 100 | Temp 98.3°F

## 2011-09-17 DIAGNOSIS — C9002 Multiple myeloma in relapse: Secondary | ICD-10-CM

## 2011-09-17 DIAGNOSIS — Z5112 Encounter for antineoplastic immunotherapy: Secondary | ICD-10-CM

## 2011-09-17 MED ORDER — SODIUM CHLORIDE 0.9 % IJ SOLN
10.0000 mL | INTRAMUSCULAR | Status: DC | PRN
  Administered 2011-09-17: 10 mL
  Filled 2011-09-17: qty 10

## 2011-09-17 MED ORDER — DEXAMETHASONE SODIUM PHOSPHATE 10 MG/ML IJ SOLN
10.0000 mg | Freq: Once | INTRAMUSCULAR | Status: AC
  Administered 2011-09-17: 10 mg via INTRAVENOUS

## 2011-09-17 MED ORDER — DEXTROSE 5 % IV SOLN
20.0000 mg/m2 | Freq: Once | INTRAVENOUS | Status: AC
  Administered 2011-09-17: 44 mg via INTRAVENOUS
  Filled 2011-09-17: qty 22

## 2011-09-17 MED ORDER — ONDANSETRON 8 MG/50ML IVPB (CHCC)
8.0000 mg | Freq: Once | INTRAVENOUS | Status: AC
  Administered 2011-09-17: 8 mg via INTRAVENOUS
  Filled 2011-09-17: qty 8

## 2011-09-17 MED ORDER — SODIUM CHLORIDE 0.9 % IV SOLN
Freq: Once | INTRAVENOUS | Status: AC
  Administered 2011-09-17: 09:00:00 via INTRAVENOUS

## 2011-09-17 MED ORDER — HEPARIN SOD (PORK) LOCK FLUSH 100 UNIT/ML IV SOLN
500.0000 [IU] | Freq: Once | INTRAVENOUS | Status: AC | PRN
  Administered 2011-09-17: 500 [IU]
  Filled 2011-09-17: qty 5

## 2011-09-17 NOTE — Letter (Signed)
September 16, 2011    [employer] Attention:  Therapist, nutritional [employer] [address] [address] [place], [place]  [address]. Reference #[id number]  NAME:  Arthur Valdez, Arthur Valdez MRN:  [id number] DOB:  1933-02-24  Dear [name] or Madam:  This letter is to appeal a decision made by [employer] regarding [name]. Manson Passey.  I find it absolutely appalling that your company would deny [name] the use of a life-prolonging and potentially life-saving agent.  [name] has an aggressive form of myeloma.  He has recurrent lambda light chain myeloma.  He has already had a stem cell transplant.  He is not tolerant of Revlimid.  He is also not tolerant of Cytoxan.  He has been treated with Velcade.  It is clearly appropriate for him to be given Kyprolis.  The FDA has approved Kyprolis specifically for patients who have had myeloma that has relapsed.  Again, he has already had a stem cell transplant.  At his age, he is not a candidate for a 2nd transplant procedure.  I feel that using Kyprolis would be very beneficial to him.  If we are not allowed to use Kyprolis, I believe that [name] will clearly end up in renal failure.  With lambda light chain myeloma, renal failure is a complication because of light chain blockage of his nephron tubules.  I realize that Kyprolis is expensive.  However, I just want to make sure that my patient is being offered the best possible therapy in order to improve his myeloma so that he does not run into complications.  I believe that if not given Kyprolis, [name] will clearly end up in the hospital.  He will end up on dialysis.  This will definitely be much more expensive for [employer] in the long run.  Since insurance decisions are guided by financial dictum, I feel that you should "look at the big picture" and realize that with successful treatment with Kyprolis, [employer] can avoid a major expense with respect to dialysis and  all complications of dialysis.  Again, I urge [employer] to reconsider their decision regarding denial of Kyprolis.  This, in my mind, would be similar to malpractice and I find it very unappealing that you all would deny a patient an FDA-approved therapy for myeloma.  I gratefully appreciate your reconsideration of your decision and await your reply.  Respectfully,    [name], M.D.  PRE/MEDQ  D:  09/16/2011  T:  09/16/2011  Job:  [id number]

## 2011-09-17 NOTE — Patient Instructions (Signed)
[facility] Discharge Instructions for Patients Receiving Chemotherapy  Today you received the following chemotherapy agentskyprolis  To help prevent nausea and vomiting after your treatment, we encourage you to take your nausea medication   If you develop nausea and vomiting that is not controlled by your nausea medication, call the clinic. If it is after clinic hours your family physician or the after hours number for the clinic or go to the Emergency Department.   BELOW ARE SYMPTOMS THAT SHOULD BE REPORTED IMMEDIATELY:  *FEVER GREATER THAN 100.5 F  *CHILLS WITH OR WITHOUT FEVER  NAUSEA AND VOMITING THAT IS NOT CONTROLLED WITH YOUR NAUSEA MEDICATION  *UNUSUAL SHORTNESS OF BREATH  *UNUSUAL BRUISING OR BLEEDING  TENDERNESS IN MOUTH AND THROAT WITH OR WITHOUT PRESENCE OF ULCERS  *URINARY PROBLEMS  *BOWEL PROBLEMS  UNUSUAL RASH Items with * indicate a potential emergency and should be followed up as soon as possible.  One of the nurses will contact you 24 hours after your treatment. Please let the nurse know about any problems that you may have experienced. Feel free to call the clinic you have any questions or concerns. The clinic phone number is [phone].   I have been informed and understand all the instructions given to me. I know to contact the clinic, my physician, or go to the Emergency Department if any problems should occur. I do not have any questions at this time, but understand that I may call the clinic during office hours   should I have any questions or need assistance in obtaining follow up care.    __________________________________________  _____________  __________ Signature of Patient or Authorized Representative            Date                   Time    __________________________________________ Nurse's Signature    

## 2011-09-18 ENCOUNTER — Ambulatory Visit: Payer: Medicare PPO

## 2011-09-23 ENCOUNTER — Ambulatory Visit (HOSPITAL_BASED_OUTPATIENT_CLINIC_OR_DEPARTMENT_OTHER): Payer: Medicare PPO

## 2011-09-23 ENCOUNTER — Other Ambulatory Visit (HOSPITAL_BASED_OUTPATIENT_CLINIC_OR_DEPARTMENT_OTHER): Payer: Medicare PPO | Admitting: Lab

## 2011-09-23 VITALS — BP 135/85 | HR 106 | Temp 98.5°F

## 2011-09-23 DIAGNOSIS — J40 Bronchitis, not specified as acute or chronic: Secondary | ICD-10-CM

## 2011-09-23 DIAGNOSIS — C9000 Multiple myeloma not having achieved remission: Secondary | ICD-10-CM

## 2011-09-23 DIAGNOSIS — C9002 Multiple myeloma in relapse: Secondary | ICD-10-CM

## 2011-09-23 DIAGNOSIS — Z5112 Encounter for antineoplastic immunotherapy: Secondary | ICD-10-CM

## 2011-09-23 LAB — CBC WITH DIFFERENTIAL (CANCER CENTER ONLY)
BASO%: 0.1 % (ref 0.0–2.0)
EOS%: 1.1 % (ref 0.0–7.0)
HCT: 32.3 % — ABNORMAL LOW (ref 38.7–49.9)
LYMPH%: 13.2 % — ABNORMAL LOW (ref 14.0–48.0)
MCHC: 33.4 g/dL (ref 32.0–35.9)
MCV: 92 fL (ref 82–98)
NEUT%: 75.9 % (ref 40.0–80.0)
RDW: 15.7 % (ref 11.1–15.7)

## 2011-09-23 MED ORDER — DEXTROSE 5 % IV SOLN
20.0000 mg/m2 | Freq: Once | INTRAVENOUS | Status: AC
  Administered 2011-09-23: 44 mg via INTRAVENOUS
  Filled 2011-09-23: qty 22

## 2011-09-23 MED ORDER — SODIUM CHLORIDE 0.9 % IV SOLN
Freq: Once | INTRAVENOUS | Status: AC
  Administered 2011-09-23: 09:00:00 via INTRAVENOUS

## 2011-09-23 MED ORDER — ONDANSETRON 8 MG/50ML IVPB (CHCC)
8.0000 mg | Freq: Once | INTRAVENOUS | Status: AC
  Administered 2011-09-23: 8 mg via INTRAVENOUS
  Filled 2011-09-23: qty 8

## 2011-09-23 MED ORDER — SODIUM CHLORIDE 0.9 % IJ SOLN
10.0000 mL | INTRAMUSCULAR | Status: DC | PRN
  Administered 2011-09-23: 10 mL
  Filled 2011-09-23: qty 10

## 2011-09-23 MED ORDER — DEXAMETHASONE SODIUM PHOSPHATE 10 MG/ML IJ SOLN
40.0000 mg | Freq: Once | INTRAMUSCULAR | Status: AC
  Administered 2011-09-23: 40 mg via INTRAVENOUS

## 2011-09-23 MED ORDER — HEPARIN SOD (PORK) LOCK FLUSH 100 UNIT/ML IV SOLN
500.0000 [IU] | Freq: Once | INTRAVENOUS | Status: AC | PRN
  Administered 2011-09-23: 500 [IU]
  Filled 2011-09-23: qty 5

## 2011-09-24 ENCOUNTER — Ambulatory Visit (HOSPITAL_BASED_OUTPATIENT_CLINIC_OR_DEPARTMENT_OTHER): Payer: Medicare PPO

## 2011-09-24 VITALS — BP 146/86 | HR 99 | Temp 97.8°F

## 2011-09-24 DIAGNOSIS — C9002 Multiple myeloma in relapse: Secondary | ICD-10-CM

## 2011-09-24 DIAGNOSIS — Z5112 Encounter for antineoplastic immunotherapy: Secondary | ICD-10-CM

## 2011-09-24 MED ORDER — SODIUM CHLORIDE 0.9 % IJ SOLN
10.0000 mL | INTRAMUSCULAR | Status: DC | PRN
  Administered 2011-09-24: 10 mL
  Filled 2011-09-24: qty 10

## 2011-09-24 MED ORDER — DEXAMETHASONE SODIUM PHOSPHATE 10 MG/ML IJ SOLN
10.0000 mg | Freq: Once | INTRAMUSCULAR | Status: AC
  Administered 2011-09-24: 10 mg via INTRAVENOUS

## 2011-09-24 MED ORDER — DEXTROSE 5 % IV SOLN
20.0000 mg/m2 | Freq: Once | INTRAVENOUS | Status: AC
  Administered 2011-09-24: 44 mg via INTRAVENOUS
  Filled 2011-09-24: qty 22

## 2011-09-24 MED ORDER — HEPARIN SOD (PORK) LOCK FLUSH 100 UNIT/ML IV SOLN
500.0000 [IU] | Freq: Once | INTRAVENOUS | Status: AC | PRN
  Administered 2011-09-24: 500 [IU]
  Filled 2011-09-24: qty 5

## 2011-09-24 MED ORDER — SODIUM CHLORIDE 0.9 % IV SOLN
Freq: Once | INTRAVENOUS | Status: AC
  Administered 2011-09-24: 12:00:00 via INTRAVENOUS

## 2011-09-24 MED ORDER — ONDANSETRON 8 MG/50ML IVPB (CHCC)
8.0000 mg | Freq: Once | INTRAVENOUS | Status: AC
  Administered 2011-09-24: 8 mg via INTRAVENOUS
  Filled 2011-09-24: qty 8

## 2011-09-24 NOTE — Patient Instructions (Signed)
[facility] Discharge Instructions for Patients Receiving Chemotherapy  Today you received the following chemotherapy agents  To help prevent nausea and vomiting after your treatment, we encourage you to take your nausea medication    If you develop nausea and vomiting that is not controlled by your nausea medication, call the clinic. If it is after clinic hours your family physician or the after hours number for the clinic or go to the Emergency Department.   BELOW ARE SYMPTOMS THAT SHOULD BE REPORTED IMMEDIATELY:  *FEVER GREATER THAN 100.5 F  *CHILLS WITH OR WITHOUT FEVER  NAUSEA AND VOMITING THAT IS NOT CONTROLLED WITH YOUR NAUSEA MEDICATION  *UNUSUAL SHORTNESS OF BREATH  *UNUSUAL BRUISING OR BLEEDING  TENDERNESS IN MOUTH AND THROAT WITH OR WITHOUT PRESENCE OF ULCERS  *URINARY PROBLEMS  *BOWEL PROBLEMS  UNUSUAL RASH Items with * indicate a potential emergency and should be followed up as soon as possible.  One of the nurses will contact you 24 hours after your treatment. Please let the nurse know about any problems that you may have experienced. Feel free to call the clinic you have any questions or concerns. The clinic phone number is [phone].   I have been informed and understand all the instructions given to me. I know to contact the clinic, my physician, or go to the Emergency Department if any problems should occur. I do not have any questions at this time, but understand that I may call the clinic during office hours   should I have any questions or need assistance in obtaining follow up care.    __________________________________________  _____________  __________ Signature of Patient or Authorized Representative            Date                   Time    __________________________________________ Nurse's Signature    

## 2011-10-07 ENCOUNTER — Other Ambulatory Visit (HOSPITAL_BASED_OUTPATIENT_CLINIC_OR_DEPARTMENT_OTHER): Payer: Medicare PPO | Admitting: Lab

## 2011-10-07 ENCOUNTER — Ambulatory Visit (HOSPITAL_BASED_OUTPATIENT_CLINIC_OR_DEPARTMENT_OTHER): Payer: Medicare PPO

## 2011-10-07 VITALS — BP 156/84 | HR 90 | Temp 97.0°F

## 2011-10-07 DIAGNOSIS — J40 Bronchitis, not specified as acute or chronic: Secondary | ICD-10-CM

## 2011-10-07 DIAGNOSIS — C9002 Multiple myeloma in relapse: Secondary | ICD-10-CM

## 2011-10-07 DIAGNOSIS — Z5112 Encounter for antineoplastic immunotherapy: Secondary | ICD-10-CM

## 2011-10-07 LAB — CBC WITH DIFFERENTIAL (CANCER CENTER ONLY)
BASO%: 0.4 % (ref 0.0–2.0)
LYMPH%: 13.3 % — ABNORMAL LOW (ref 14.0–48.0)
MCH: 31 pg (ref 28.0–33.4)
MCV: 92 fL (ref 82–98)
MONO%: 13.1 % — ABNORMAL HIGH (ref 0.0–13.0)
NEUT%: 71.4 % (ref 40.0–80.0)
Platelets: 177 10*3/uL (ref 145–400)
RDW: 16.1 % — ABNORMAL HIGH (ref 11.1–15.7)

## 2011-10-07 MED ORDER — DEXTROSE 5 % IV SOLN
60.0000 mg | Freq: Once | INTRAVENOUS | Status: AC
  Administered 2011-10-07: 60 mg via INTRAVENOUS
  Filled 2011-10-07: qty 30

## 2011-10-07 MED ORDER — SODIUM CHLORIDE 0.9 % IJ SOLN
10.0000 mL | INTRAMUSCULAR | Status: DC | PRN
  Administered 2011-10-07: 10 mL
  Filled 2011-10-07: qty 10

## 2011-10-07 MED ORDER — DEXAMETHASONE SODIUM PHOSPHATE 10 MG/ML IJ SOLN
40.0000 mg | Freq: Once | INTRAMUSCULAR | Status: AC
  Administered 2011-10-07: 40 mg via INTRAVENOUS

## 2011-10-07 MED ORDER — SODIUM CHLORIDE 0.9 % IV SOLN: Freq: Once | INTRAVENOUS | Status: DC

## 2011-10-07 MED ORDER — HEPARIN SOD (PORK) LOCK FLUSH 100 UNIT/ML IV SOLN
500.0000 [IU] | Freq: Once | INTRAVENOUS | Status: AC | PRN
  Administered 2011-10-07: 500 [IU]
  Filled 2011-10-07: qty 5

## 2011-10-07 MED ORDER — ONDANSETRON 8 MG/50ML IVPB (CHCC)
8.0000 mg | Freq: Once | INTRAVENOUS | Status: AC
  Administered 2011-10-07: 8 mg via INTRAVENOUS
  Filled 2011-10-07: qty 8

## 2011-10-08 ENCOUNTER — Ambulatory Visit (HOSPITAL_BASED_OUTPATIENT_CLINIC_OR_DEPARTMENT_OTHER): Payer: Medicare PPO

## 2011-10-08 DIAGNOSIS — Z5112 Encounter for antineoplastic immunotherapy: Secondary | ICD-10-CM

## 2011-10-08 DIAGNOSIS — C9002 Multiple myeloma in relapse: Secondary | ICD-10-CM

## 2011-10-08 MED ORDER — DEXTROSE 5 % IV SOLN
60.0000 mg | Freq: Once | INTRAVENOUS | Status: AC
  Administered 2011-10-08: 60 mg via INTRAVENOUS
  Filled 2011-10-08: qty 30

## 2011-10-08 MED ORDER — SODIUM CHLORIDE 0.9 % IV SOLN
Freq: Once | INTRAVENOUS | Status: AC
  Administered 2011-10-08: 12:00:00 via INTRAVENOUS

## 2011-10-08 MED ORDER — ONDANSETRON 8 MG/50ML IVPB (CHCC)
8.0000 mg | Freq: Once | INTRAVENOUS | Status: AC
  Administered 2011-10-08: 8 mg via INTRAVENOUS
  Filled 2011-10-08: qty 8

## 2011-10-08 MED ORDER — DEXAMETHASONE SODIUM PHOSPHATE 10 MG/ML IJ SOLN
10.0000 mg | Freq: Once | INTRAMUSCULAR | Status: AC
  Administered 2011-10-08: 10 mg via INTRAVENOUS

## 2011-10-08 MED ORDER — HEPARIN SOD (PORK) LOCK FLUSH 100 UNIT/ML IV SOLN
500.0000 [IU] | Freq: Once | INTRAVENOUS | Status: AC | PRN
  Administered 2011-10-08: 500 [IU]
  Filled 2011-10-08: qty 5

## 2011-10-08 MED ORDER — SODIUM CHLORIDE 0.9 % IJ SOLN
10.0000 mL | INTRAMUSCULAR | Status: DC | PRN
  Administered 2011-10-08: 10 mL
  Filled 2011-10-08: qty 10

## 2011-10-08 NOTE — Patient Instructions (Signed)
 [facility] Discharge Inst[facility] for Patients Receiving Chemotherapy  Today you received the following chemotherapy agents kyoprolis To help prevent nausea and vomiting after your treatment, we encourage you to take your nausea medication   If you develop nausea and vomiting that is not controlled by your nausea medication, call the clinic. If it is after clinic hours your family physician or the after hours number for the clinic or go to the Emergency Department.   BELOW ARE SYMPTOMS THAT SHOULD BE REPORTED IMMEDIATELY:  *FEVER GREATER THAN 100.5 F  *CHILLS WITH OR WITHOUT FEVER  NAUSEA AND VOMITING THAT IS NOT CONTROLLED WITH YOUR NAUSEA MEDICATION  *UNUSUAL SHORTNESS OF BREATH  *UNUSUAL BRUISING OR BLEEDING  TENDERNESS IN MOUTH AND THROAT WITH OR WITHOUT PRESENCE OF ULCERS  *URINARY PROBLEMS  *BOWEL PROBLEMS  UNUSUAL RASH Items with * indicate a potential emergency and should be followed up as soon as possible.  One of the nurses will contact you 24 hours after your treatment. Please let the nurse know about any problems that you may have experienced. Feel free to call the clinic you have any questions or concerns. The clinic phone number is [phone].   I have been informed and understand all the instructions given to me. I know to contact the clinic, my physician, or go to the Emergency Department if any problems should occur. I do not have any questions at this time, but understand that I may call the clinic during office hours   should I have any questions or need assistance in obtaining follow up care.    __________________________________________  _____________  __________ Signature of Patient or Authorized Representative            Date                   Time    __________________________________________ Nurse's Signature

## 2011-10-12 LAB — COMPREHENSIVE METABOLIC PANEL
ALT: 11 U/L (ref 0–53)
Alkaline Phosphatase: 67 U/L (ref 39–117)
Creatinine, Ser: 1.37 mg/dL — ABNORMAL HIGH (ref 0.50–1.35)
Glucose, Bld: 236 mg/dL — ABNORMAL HIGH (ref 70–99)
Sodium: 140 mEq/L (ref 135–145)
Total Bilirubin: 0.6 mg/dL (ref 0.3–1.2)
Total Protein: 6.2 g/dL (ref 6.0–8.3)

## 2011-10-12 LAB — PROTEIN ELECTROPHORESIS, SERUM, WITH REFLEX
Alpha-1-Globulin: 6.8 % — ABNORMAL HIGH (ref 2.9–4.9)
Beta 2: 4.8 % (ref 3.2–6.5)
Gamma Globulin: 9.1 % — ABNORMAL LOW (ref 11.1–18.8)

## 2011-10-12 LAB — KAPPA/LAMBDA LIGHT CHAINS: Lambda Free Lght Chn: 158 mg/dL — ABNORMAL HIGH (ref 0.57–2.63)

## 2011-10-12 LAB — IGG, IGA, IGM
IgA: 76 mg/dL (ref 68–379)
IgG (Immunoglobin G), Serum: 633 mg/dL — ABNORMAL LOW (ref 650–1600)
IgM, Serum: 32 mg/dL — ABNORMAL LOW (ref 41–251)

## 2011-10-12 LAB — LACTATE DEHYDROGENASE: LDH: 164 U/L (ref 94–250)

## 2011-10-14 ENCOUNTER — Other Ambulatory Visit: Payer: Medicare PPO | Admitting: Lab

## 2011-10-14 ENCOUNTER — Ambulatory Visit (HOSPITAL_BASED_OUTPATIENT_CLINIC_OR_DEPARTMENT_OTHER): Payer: Medicare PPO

## 2011-10-14 ENCOUNTER — Other Ambulatory Visit: Payer: Self-pay | Admitting: Hematology & Oncology

## 2011-10-14 VITALS — BP 138/80 | HR 104 | Temp 97.2°F

## 2011-10-14 DIAGNOSIS — Z5112 Encounter for antineoplastic immunotherapy: Secondary | ICD-10-CM

## 2011-10-14 DIAGNOSIS — C9002 Multiple myeloma in relapse: Secondary | ICD-10-CM

## 2011-10-14 DIAGNOSIS — C9000 Multiple myeloma not having achieved remission: Secondary | ICD-10-CM

## 2011-10-14 MED ORDER — DEXAMETHASONE SODIUM PHOSPHATE 10 MG/ML IJ SOLN: 40.0000 mg | Freq: Once | INTRAMUSCULAR | Status: DC

## 2011-10-14 MED ORDER — DEXAMETHASONE SODIUM PHOSPHATE 10 MG/ML IJ SOLN
10.0000 mg | Freq: Once | INTRAMUSCULAR | Status: AC
  Administered 2011-10-14: 10 mg via INTRAVENOUS

## 2011-10-14 MED ORDER — HEPARIN SOD (PORK) LOCK FLUSH 100 UNIT/ML IV SOLN
500.0000 [IU] | Freq: Once | INTRAVENOUS | Status: AC | PRN
  Administered 2011-10-14: 500 [IU]
  Filled 2011-10-14: qty 5

## 2011-10-14 MED ORDER — DEXTROSE 5 % IV SOLN
60.0000 mg | Freq: Once | INTRAVENOUS | Status: AC
  Administered 2011-10-14: 60 mg via INTRAVENOUS
  Filled 2011-10-14: qty 30

## 2011-10-14 MED ORDER — SODIUM CHLORIDE 0.9 % IV SOLN
Freq: Once | INTRAVENOUS | Status: AC
  Administered 2011-10-14: 12:00:00 via INTRAVENOUS

## 2011-10-14 MED ORDER — SODIUM CHLORIDE 0.9 % IJ SOLN
10.0000 mL | INTRAMUSCULAR | Status: DC | PRN
  Administered 2011-10-14: 10 mL
  Filled 2011-10-14: qty 10

## 2011-10-14 MED ORDER — ONDANSETRON 8 MG/50ML IVPB (CHCC)
8.0000 mg | Freq: Once | INTRAVENOUS | Status: AC
  Administered 2011-10-14: 8 mg via INTRAVENOUS
  Filled 2011-10-14: qty 8

## 2011-10-14 MED ORDER — ZOLEDRONIC ACID 4 MG/5ML IV CONC
3.5000 mg | Freq: Once | INTRAVENOUS | Status: AC
  Administered 2011-10-14: 3.5 mg via INTRAVENOUS
  Filled 2011-10-14: qty 4.38

## 2011-10-14 NOTE — Patient Instructions (Signed)
[facility] Discharge Instructions for Patients Receiving Chemotherapy  Today you received the following chemotherapy agents Kyprolis.  To help prevent nausea and vomiting after your treatment, we encourage you to take your nausea medication.   If you develop nausea and vomiting that is not controlled by your nausea medication, call the clinic. If it is after clinic hours your family physician or the after hours number for the clinic or go to the Emergency Department.   BELOW ARE SYMPTOMS THAT SHOULD BE REPORTED IMMEDIATELY:  *FEVER GREATER THAN 100.5 F  *CHILLS WITH OR WITHOUT FEVER  NAUSEA AND VOMITING THAT IS NOT CONTROLLED WITH YOUR NAUSEA MEDICATION  *UNUSUAL SHORTNESS OF BREATH  *UNUSUAL BRUISING OR BLEEDING  TENDERNESS IN MOUTH AND THROAT WITH OR WITHOUT PRESENCE OF ULCERS  *URINARY PROBLEMS  *BOWEL PROBLEMS  UNUSUAL RASH Items with * indicate a potential emergency and should be followed up as soon as possible.  One of the nurses will contact you 24 hours after your treatment. Please let the nurse know about any problems that you may have experienced. Feel free to call the clinic you have any questions or concerns. The clinic phone number is [phone].   I have been informed and understand all the instructions given to me. I know to contact the clinic, my physician, or go to the Emergency Department if any problems should occur. I do not have any questions at this time, but understand that I may call the clinic during office hours   should I have any questions or need assistance in obtaining follow up care.    __________________________________________  _____________  __________ Signature of Patient or Authorized Representative            Date                   Time    __________________________________________ Nurse's Signature    

## 2011-10-15 ENCOUNTER — Ambulatory Visit (HOSPITAL_BASED_OUTPATIENT_CLINIC_OR_DEPARTMENT_OTHER): Payer: Medicare PPO

## 2011-10-15 VITALS — BP 153/92 | HR 93 | Temp 97.1°F

## 2011-10-15 DIAGNOSIS — C9002 Multiple myeloma in relapse: Secondary | ICD-10-CM

## 2011-10-15 DIAGNOSIS — Z5112 Encounter for antineoplastic immunotherapy: Secondary | ICD-10-CM

## 2011-10-15 MED ORDER — CARFILZOMIB CHEMO INJECTION 60 MG
60.0000 mg | Freq: Once | INTRAVENOUS | Status: AC
  Administered 2011-10-15: 60 mg via INTRAVENOUS
  Filled 2011-10-15: qty 30

## 2011-10-15 MED ORDER — DEXAMETHASONE SODIUM PHOSPHATE 10 MG/ML IJ SOLN
10.0000 mg | Freq: Once | INTRAMUSCULAR | Status: AC
  Administered 2011-10-15: 10 mg via INTRAVENOUS

## 2011-10-15 MED ORDER — ONDANSETRON 8 MG/50ML IVPB (CHCC)
8.0000 mg | Freq: Once | INTRAVENOUS | Status: AC
  Administered 2011-10-15: 8 mg via INTRAVENOUS
  Filled 2011-10-15: qty 8

## 2011-10-15 MED ORDER — SODIUM CHLORIDE 0.9 % IV SOLN
Freq: Once | INTRAVENOUS | Status: AC
  Administered 2011-10-15: 10:00:00 via INTRAVENOUS

## 2011-10-15 MED ORDER — HEPARIN SOD (PORK) LOCK FLUSH 100 UNIT/ML IV SOLN
500.0000 [IU] | Freq: Once | INTRAVENOUS | Status: AC | PRN
  Administered 2011-10-15: 500 [IU]
  Filled 2011-10-15: qty 5

## 2011-10-15 MED ORDER — SODIUM CHLORIDE 0.9 % IJ SOLN
3.0000 mL | INTRAMUSCULAR | Status: DC | PRN
  Administered 2011-10-15: 3 mL via INTRAVENOUS
  Filled 2011-10-15: qty 10

## 2011-10-21 ENCOUNTER — Ambulatory Visit (HOSPITAL_BASED_OUTPATIENT_CLINIC_OR_DEPARTMENT_OTHER): Payer: Medicare PPO

## 2011-10-21 ENCOUNTER — Other Ambulatory Visit (HOSPITAL_BASED_OUTPATIENT_CLINIC_OR_DEPARTMENT_OTHER): Payer: Medicare PPO | Admitting: Lab

## 2011-10-21 ENCOUNTER — Other Ambulatory Visit: Payer: Self-pay | Admitting: *Deleted

## 2011-10-21 ENCOUNTER — Other Ambulatory Visit: Payer: Self-pay | Admitting: Hematology & Oncology

## 2011-10-21 ENCOUNTER — Telehealth: Payer: Self-pay | Admitting: Hematology & Oncology

## 2011-10-21 VITALS — BP 146/90 | HR 95 | Temp 96.8°F

## 2011-10-21 DIAGNOSIS — Z5112 Encounter for antineoplastic immunotherapy: Secondary | ICD-10-CM

## 2011-10-21 DIAGNOSIS — C9002 Multiple myeloma in relapse: Secondary | ICD-10-CM

## 2011-10-21 LAB — CMP (CANCER CENTER ONLY)
ALT(SGPT): 15 U/L (ref 10–47)
AST: 17 U/L (ref 11–38)
Albumin: 3.1 g/dL — ABNORMAL LOW (ref 3.3–5.5)
CO2: 28 mEq/L (ref 18–33)
Calcium: 8.8 mg/dL (ref 8.0–10.3)
Chloride: 105 mEq/L (ref 98–108)
Potassium: 3.6 mEq/L (ref 3.3–4.7)

## 2011-10-21 LAB — CBC WITH DIFFERENTIAL (CANCER CENTER ONLY)
BASO#: 0 10*3/uL (ref 0.0–0.2)
Eosinophils Absolute: 0.1 10*3/uL (ref 0.0–0.5)
HGB: 9.8 g/dL — ABNORMAL LOW (ref 13.0–17.1)
LYMPH%: 13 % — ABNORMAL LOW (ref 14.0–48.0)
MCH: 31.4 pg (ref 28.0–33.4)
MCV: 91 fL (ref 82–98)
MONO#: 0.7 10*3/uL (ref 0.1–0.9)
MONO%: 10.7 % (ref 0.0–13.0)
NEUT#: 4.6 10*3/uL (ref 1.5–6.5)
RBC: 3.12 10*6/uL — ABNORMAL LOW (ref 4.20–5.70)
WBC: 6.1 10*3/uL (ref 4.0–10.0)

## 2011-10-21 MED ORDER — DEXTROSE 5 % IV SOLN
60.0000 mg | Freq: Once | INTRAVENOUS | Status: AC
  Administered 2011-10-21: 60 mg via INTRAVENOUS
  Filled 2011-10-21: qty 30

## 2011-10-21 MED ORDER — SODIUM CHLORIDE 0.9 % IV SOLN: Freq: Once | INTRAVENOUS | Status: DC

## 2011-10-21 MED ORDER — ONDANSETRON 8 MG/50ML IVPB (CHCC)
8.0000 mg | Freq: Once | INTRAVENOUS | Status: AC
  Administered 2011-10-21: 8 mg via INTRAVENOUS

## 2011-10-21 MED ORDER — DEXAMETHASONE SODIUM PHOSPHATE 10 MG/ML IJ SOLN
10.0000 mg | Freq: Once | INTRAMUSCULAR | Status: AC
  Administered 2011-10-21: 10 mg via INTRAVENOUS

## 2011-10-21 NOTE — Patient Instructions (Addendum)
[facility] Discharge Instructions for Patients Receiving Chemotherapy  Today you received the following chemotherapy agents:  Kyprolis  To help prevent nausea and vomiting after your treatment, we encourage you to take your nausea medication as prescribed.  If you develop nausea and vomiting that is not controlled by your nausea medication, call the clinic. If it is after clinic hours your family physician or the after hours number for the clinic or go to the Emergency Department.   BELOW ARE SYMPTOMS THAT SHOULD BE REPORTED IMMEDIATELY:  *FEVER GREATER THAN 101.0 F  *CHILLS WITH OR WITHOUT FEVER  NAUSEA AND VOMITING THAT IS NOT CONTROLLED WITH YOUR NAUSEA MEDICATION  *UNUSUAL SHORTNESS OF BREATH  *UNUSUAL BRUISING OR BLEEDING  TENDERNESS IN MOUTH AND THROAT WITH OR WITHOUT PRESENCE OF ULCERS  *URINARY PROBLEMS  *BOWEL PROBLEMS  UNUSUAL RASH Items with * indicate a potential emergency and should be followed up as soon as possible.    I have been informed and understand all the instructions given to me. I know to contact the clinic, my physician, or go to the Emergency Department if any problems should occur. I do not have any questions at this time, but understand that I may call the clinic during office hours or the Patient Navigator at [phone] should I have any questions or need assistance in obtaining follow up care.    __________________________________________  _____________  __________ Signature of Patient or Authorized Representative            Date                   Time    __________________________________________ Nurse's Signature   

## 2011-10-21 NOTE — Telephone Encounter (Signed)
Faxed completed application (with patient signature) to [employer].  They called asking for his signature on the application.

## 2011-10-21 NOTE — Progress Notes (Signed)
When accessing right porta-cath, no blood return noted after flushing. Site swollen, needle removed.  Re-cannulated with no blood return, area continues to swell.  Dr Myna Hidalgo notified. Dye Study scheduled for 10-22-11.  4:27 PMWas reaccessed per [facility] with same results. Needle removed and transparent dressing applied. Teola Bradley, Makensey Rego [employer]

## 2011-10-22 ENCOUNTER — Ambulatory Visit (HOSPITAL_COMMUNITY)
Admission: RE | Admit: 2011-10-22 | Discharge: 2011-10-22 | Disposition: A | Discharge status: Home / Self Care | Payer: Medicare PPO | Source: Ambulatory Visit | Attending: Hematology & Oncology | Admitting: Hematology & Oncology

## 2011-10-22 ENCOUNTER — Other Ambulatory Visit: Payer: Self-pay | Admitting: *Deleted

## 2011-10-22 ENCOUNTER — Other Ambulatory Visit: Payer: Self-pay | Admitting: Hematology & Oncology

## 2011-10-22 ENCOUNTER — Ambulatory Visit (HOSPITAL_BASED_OUTPATIENT_CLINIC_OR_DEPARTMENT_OTHER): Payer: Medicare PPO

## 2011-10-22 VITALS — BP 144/90 | HR 93 | Temp 97.4°F

## 2011-10-22 DIAGNOSIS — C9002 Multiple myeloma in relapse: Secondary | ICD-10-CM

## 2011-10-22 DIAGNOSIS — Z5112 Encounter for antineoplastic immunotherapy: Secondary | ICD-10-CM

## 2011-10-22 DIAGNOSIS — Y838 Other surgical procedures as the cause of abnormal reaction of the patient, or of later complication, without mention of misadventure at the time of the procedure: Secondary | ICD-10-CM | POA: Insufficient documentation

## 2011-10-22 DIAGNOSIS — T82598A Other mechanical complication of other cardiac and vascular devices and implants, initial encounter: Secondary | ICD-10-CM | POA: Insufficient documentation

## 2011-10-22 MED ORDER — DEXTROSE 5 % IV SOLN
60.0000 mg | Freq: Once | INTRAVENOUS | Status: AC
  Administered 2011-10-22: 60 mg via INTRAVENOUS
  Filled 2011-10-22: qty 30

## 2011-10-22 MED ORDER — IOHEXOL 300 MG/ML  SOLN: 2.0000 mL | Freq: Once | INTRAMUSCULAR | Status: AC | PRN

## 2011-10-22 MED ORDER — DEXAMETHASONE SODIUM PHOSPHATE 10 MG/ML IJ SOLN
10.0000 mg | Freq: Once | INTRAMUSCULAR | Status: AC
  Administered 2011-10-22: 10 mg via INTRAVENOUS

## 2011-10-22 MED ORDER — SODIUM CHLORIDE 0.9 % IV SOLN: Freq: Once | INTRAVENOUS | Status: DC

## 2011-10-22 MED ORDER — ONDANSETRON 8 MG/50ML IVPB (CHCC)
8.0000 mg | Freq: Once | INTRAVENOUS | Status: AC
  Administered 2011-10-22: 8 mg via INTRAVENOUS

## 2011-10-22 NOTE — Patient Instructions (Signed)
[facility] Discharge Instructions for Patients Receiving Chemotherapy  Today you received the following chemotherapy agents:  Kyprolis  To help prevent nausea and vomiting after your treatment, we encourage you to take your nausea medication as prescribed.  If you develop nausea and vomiting that is not controlled by your nausea medication, call the clinic. If it is after clinic hours your family physician or the after hours number for the clinic or go to the Emergency Department.   BELOW ARE SYMPTOMS THAT SHOULD BE REPORTED IMMEDIATELY:  *FEVER GREATER THAN 101.0 F  *CHILLS WITH OR WITHOUT FEVER  NAUSEA AND VOMITING THAT IS NOT CONTROLLED WITH YOUR NAUSEA MEDICATION  *UNUSUAL SHORTNESS OF BREATH  *UNUSUAL BRUISING OR BLEEDING  TENDERNESS IN MOUTH AND THROAT WITH OR WITHOUT PRESENCE OF ULCERS  *URINARY PROBLEMS  *BOWEL PROBLEMS  UNUSUAL RASH Items with * indicate a potential emergency and should be followed up as soon as possible.    I have been informed and understand all the instructions given to me. I know to contact the clinic, my physician, or go to the Emergency Department if any problems should occur. I do not have any questions at this time, but understand that I may call the clinic during office hours or the Patient Navigator at [phone] should I have any questions or need assistance in obtaining follow up care.    __________________________________________  _____________  __________ Signature of Patient or Authorized Representative            Date                   Time    __________________________________________ Nurse's Signature

## 2011-10-26 ENCOUNTER — Telehealth: Payer: Self-pay | Admitting: *Deleted

## 2011-10-26 NOTE — Telephone Encounter (Signed)
Message copied by Anselm Jungling on Mon Oct 26, 2011  9:43 AM ------      Message from: Arlan Organ R      Created: Thu Oct 22, 2011  5:38 PM       Call.  Myeloma is getting a little better!!!  Kidneys are doing better.  Cindee Lame

## 2011-10-26 NOTE — Telephone Encounter (Signed)
Called patient to let him know that his myeloma was better and kidneys were better according to bloodwork.

## 2011-11-04 ENCOUNTER — Ambulatory Visit: Payer: Medicare PPO

## 2011-11-04 ENCOUNTER — Other Ambulatory Visit: Payer: Medicare PPO | Admitting: Lab

## 2011-11-04 ENCOUNTER — Ambulatory Visit (HOSPITAL_BASED_OUTPATIENT_CLINIC_OR_DEPARTMENT_OTHER): Payer: Medicare PPO

## 2011-11-04 ENCOUNTER — Other Ambulatory Visit (HOSPITAL_BASED_OUTPATIENT_CLINIC_OR_DEPARTMENT_OTHER): Payer: Medicare PPO | Admitting: Lab

## 2011-11-04 ENCOUNTER — Ambulatory Visit (HOSPITAL_BASED_OUTPATIENT_CLINIC_OR_DEPARTMENT_OTHER): Payer: Medicare PPO | Admitting: Hematology & Oncology

## 2011-11-04 VITALS — BP 120/80 | HR 98 | Temp 97.5°F | Ht 71.0 in | Wt 228.0 lb

## 2011-11-04 DIAGNOSIS — C9000 Multiple myeloma not having achieved remission: Secondary | ICD-10-CM

## 2011-11-04 DIAGNOSIS — Z5112 Encounter for antineoplastic immunotherapy: Secondary | ICD-10-CM

## 2011-11-04 DIAGNOSIS — E119 Type 2 diabetes mellitus without complications: Secondary | ICD-10-CM

## 2011-11-04 DIAGNOSIS — C9002 Multiple myeloma in relapse: Secondary | ICD-10-CM

## 2011-11-04 LAB — CMP (CANCER CENTER ONLY)
Alkaline Phosphatase: 74 U/L (ref 26–84)
BUN, Bld: 18 mg/dL (ref 7–22)
CO2: 27 mEq/L (ref 18–33)
Creat: 1.2 mg/dl (ref 0.6–1.2)
Glucose, Bld: 436 mg/dL — ABNORMAL HIGH (ref 73–118)
Total Bilirubin: 0.8 mg/dl (ref 0.20–1.60)
Total Protein: 7.2 g/dL (ref 6.4–8.1)

## 2011-11-04 LAB — CBC WITH DIFFERENTIAL (CANCER CENTER ONLY)
BASO#: 0 10*3/uL (ref 0.0–0.2)
EOS%: 1.3 % (ref 0.0–7.0)
Eosinophils Absolute: 0.1 10*3/uL (ref 0.0–0.5)
HGB: 10.2 g/dL — ABNORMAL LOW (ref 13.0–17.1)
LYMPH%: 16.9 % (ref 14.0–48.0)
MCH: 31.1 pg (ref 28.0–33.4)
MCHC: 34.2 g/dL (ref 32.0–35.9)
MCV: 91 fL (ref 82–98)
MONO%: 9.2 % (ref 0.0–13.0)
NEUT#: 3.4 10*3/uL (ref 1.5–6.5)
NEUT%: 72.4 % (ref 40.0–80.0)
RBC: 3.28 10*6/uL — ABNORMAL LOW (ref 4.20–5.70)

## 2011-11-04 MED ORDER — DEXAMETHASONE SODIUM PHOSPHATE 10 MG/ML IJ SOLN
10.0000 mg | Freq: Once | INTRAMUSCULAR | Status: DC
  Administered 2011-11-04: 10 mg via INTRAVENOUS

## 2011-11-04 MED ORDER — INSULIN REGULAR HUMAN 100 UNIT/ML IJ SOLN
15.0000 [IU] | Freq: Once | INTRAMUSCULAR | Status: AC
  Administered 2011-11-04: 15 [IU] via SUBCUTANEOUS

## 2011-11-04 MED ORDER — SODIUM CHLORIDE 0.9 % IJ SOLN
10.0000 mL | INTRAMUSCULAR | Status: DC | PRN
  Filled 2011-11-04: qty 10

## 2011-11-04 MED ORDER — SODIUM CHLORIDE 0.9 % IV SOLN
Freq: Once | INTRAVENOUS | Status: AC
  Administered 2011-11-04: 13:00:00 via INTRAVENOUS

## 2011-11-04 MED ORDER — HEPARIN SOD (PORK) LOCK FLUSH 100 UNIT/ML IV SOLN
500.0000 [IU] | Freq: Once | INTRAVENOUS | Status: DC | PRN
  Filled 2011-11-04: qty 5

## 2011-11-04 MED ORDER — DEXTROSE 5 % IV SOLN
60.0000 mg | Freq: Once | INTRAVENOUS | Status: AC
  Administered 2011-11-04: 60 mg via INTRAVENOUS
  Filled 2011-11-04: qty 30

## 2011-11-04 MED ORDER — ONDANSETRON 8 MG/50ML IVPB (CHCC)
8.0000 mg | Freq: Once | INTRAVENOUS | Status: AC
  Administered 2011-11-04: 8 mg via INTRAVENOUS

## 2011-11-04 NOTE — Patient Instructions (Signed)
[facility] Discharge Instructions for Patients Receiving Chemotherapy  Today you received the following chemotherapy agents Kyprolis.  To help prevent nausea and vomiting after your treatment, we encourage you to take your nausea medication.   If you develop nausea and vomiting that is not controlled by your nausea medication, call the clinic. If it is after clinic hours your family physician or the after hours number for the clinic or go to the Emergency Department.   BELOW ARE SYMPTOMS THAT SHOULD BE REPORTED IMMEDIATELY:  *FEVER GREATER THAN 100.5 F  *CHILLS WITH OR WITHOUT FEVER  NAUSEA AND VOMITING THAT IS NOT CONTROLLED WITH YOUR NAUSEA MEDICATION  *UNUSUAL SHORTNESS OF BREATH  *UNUSUAL BRUISING OR BLEEDING  TENDERNESS IN MOUTH AND THROAT WITH OR WITHOUT PRESENCE OF ULCERS  *URINARY PROBLEMS  *BOWEL PROBLEMS  UNUSUAL RASH Items with * indicate a potential emergency and should be followed up as soon as possible.  One of the nurses will contact you 24 hours after your treatment. Please let the nurse know about any problems that you may have experienced. Feel free to call the clinic you have any questions or concerns. The clinic phone number is [phone].   I have been informed and understand all the instructions given to me. I know to contact the clinic, my physician, or go to the Emergency Department if any problems should occur. I do not have any questions at this time, but understand that I may call the clinic during office hours   should I have any questions or need assistance in obtaining follow up care.    __________________________________________  _____________  __________ Signature of Patient or Authorized Representative            Date                   Time    __________________________________________ Nurse's Signature    

## 2011-11-04 NOTE — Progress Notes (Signed)
This office note has been dictated.

## 2011-11-04 NOTE — Progress Notes (Signed)
CC:   [name], M.D. Raye Sorrow, MD  DIAGNOSES: 1. Recurrent lambda light chain myeloma. 2. Steroid-induced hyperglycemia/diabetes.  CURRENT THERAPY:  The patient is status post 2 cycles of Kyprolis.  INTERIM HISTORY:  Mr. Worth comes in for followup.  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  His blood sugar today was about 430.  He says he has been drinking a lot of water.  He has lost a little weight.  Apparently, he had been on Januvia in the past from Dr. Perrin Maltese. I will go ahead and give him some insulin today.  He said he will speak with Dr. Perrin Maltese about getting back on Januvia.  His myeloma studies have gotten a little better.  His last lambda free light chain was 147 mg/dL back in early March.  He does not have a monoclonal spike in his serum.  His renal function has actually improved.  He is not complaining of pain.  He is having no cough.  He has had no fever.  He has had no swelling in his legs.  PHYSICAL EXAM:  General: This is a well-developed, well-nourished black gentleman in no obvious distress.  Vital signs: Temperature 97.5, pulse 90, respiratory 16, blood pressure 120/80, and weight is 228.  Head and neck exam shows a normocephalic, atraumatic skull.  There are no ocular or oral lesions.  There are no palpable cervical or supraclavicular lymph nodes.  Lungs are clear to percussion and auscultation bilaterally.  Cardiac examination:  Regular rate and rhythm with a normal S1 and S2.  There are no murmurs, rubs, or bruits.  Abdominal exam: Soft with good bowel sounds.  There is no palpable abdominal mass. There is no palpable hepatosplenomegaly.  Extremities: Shows no clubbing, cyanosis or edema.  Neurological exam:  No focal neurological deficits.  LABORATORY STUDIES:  White cell count 4.7, hemoglobin 10.2, hematocrit 29.8, and platelet count is 167.  IMPRESSION:  Mr. Hanners is a 78 year old gentleman with recurrent lambda light chain  myeloma.  He is on Kyprolis.  He is doing okay with this. We will see how his monoclonal studies go with the start of his 4th cycle of treatment in a month.  I will plan to see him back in another month for followup.  Again, we will see how his myeloma studies look.  We will be checking his blood sugars with each treatment.   ______________________________ [name], M.D. PRE/MEDQ  D:  11/04/2011  T:  11/04/2011  Job:  [id number]

## 2011-11-05 ENCOUNTER — Ambulatory Visit (INDEPENDENT_AMBULATORY_CARE_PROVIDER_SITE_OTHER): Payer: Medicare PPO | Admitting: Family Medicine

## 2011-11-05 ENCOUNTER — Ambulatory Visit (HOSPITAL_BASED_OUTPATIENT_CLINIC_OR_DEPARTMENT_OTHER): Payer: Medicare PPO

## 2011-11-05 ENCOUNTER — Other Ambulatory Visit (HOSPITAL_BASED_OUTPATIENT_CLINIC_OR_DEPARTMENT_OTHER): Payer: Medicare PPO | Admitting: Lab

## 2011-11-05 VITALS — BP 125/78 | HR 85 | Temp 97.5°F

## 2011-11-05 VITALS — BP 151/80 | HR 108 | Temp 97.9°F | Resp 20 | Ht 70.0 in | Wt 225.0 lb

## 2011-11-05 DIAGNOSIS — C9000 Multiple myeloma not having achieved remission: Secondary | ICD-10-CM

## 2011-11-05 DIAGNOSIS — E119 Type 2 diabetes mellitus without complications: Secondary | ICD-10-CM

## 2011-11-05 DIAGNOSIS — E039 Hypothyroidism, unspecified: Secondary | ICD-10-CM

## 2011-11-05 DIAGNOSIS — Z5112 Encounter for antineoplastic immunotherapy: Secondary | ICD-10-CM

## 2011-11-05 DIAGNOSIS — C9002 Multiple myeloma in relapse: Secondary | ICD-10-CM

## 2011-11-05 LAB — BASIC METABOLIC PANEL - CANCER CENTER ONLY
CO2: 23 mEq/L (ref 18–33)
Chloride: 96 mEq/L — ABNORMAL LOW (ref 98–108)
Sodium: 131 mEq/L (ref 128–145)

## 2011-11-05 MED ORDER — INSULIN REGULAR HUMAN 100 UNIT/ML IJ SOLN
20.0000 [IU] | Freq: Once | INTRAMUSCULAR | Status: AC
  Administered 2011-11-05: 20 [IU] via SUBCUTANEOUS

## 2011-11-05 MED ORDER — DEXAMETHASONE SODIUM PHOSPHATE 10 MG/ML IJ SOLN: 10.0000 mg | Freq: Once | INTRAMUSCULAR | Status: DC

## 2011-11-05 MED ORDER — LEVOTHYROXINE SODIUM 50 MCG PO TABS: 50.0000 ug | ORAL_TABLET | Freq: Every day | ORAL | Status: DC

## 2011-11-05 MED ORDER — SODIUM CHLORIDE 0.9 % IV SOLN
Freq: Once | INTRAVENOUS | Status: AC
  Administered 2011-11-05: 12:00:00 via INTRAVENOUS

## 2011-11-05 MED ORDER — ONDANSETRON 8 MG/50ML IVPB (CHCC)
8.0000 mg | Freq: Once | INTRAVENOUS | Status: AC
  Administered 2011-11-05: 8 mg via INTRAVENOUS

## 2011-11-05 MED ORDER — DEXTROSE 5 % IV SOLN
60.0000 mg | Freq: Once | INTRAVENOUS | Status: AC
  Administered 2011-11-05: 60 mg via INTRAVENOUS
  Filled 2011-11-05: qty 30

## 2011-11-05 NOTE — Progress Notes (Signed)
  Subjective:    Patient ID: Arthur Valdez, male    DOB: [birth year], 78 y.o.   MRN: [id number]  HPI 78 yo male with multiple myeloma and h/o glucose intolerance/diet controlled diabetes here because sugar was greatly elevated today at chemotherapy.  Was apparently on steroids and has now developed steroid induced diabetes.  Sugar over 500 at oncology office today.  Was given 20 units regular insulin there, abbout 1pm he thinks.  He was at one time prescribed januvia and in follow-up his sugar was great, so he stopped taking it.  Endorses polydipsia, polyuria, and fatigue over last 2-3 weeks.  A CMET drawn in early March did show a glucose of 236.  Also, had colonoscopy about 3 months ago.  Was told to stop his synthroid and GERD medicine at that time.  Didn't realize he was supposed to resume them after the procedure, so has also been off those meds for 3 months.    Review of Systems Negative except as per HPI     Objective:   Physical Exam  Constitutional: He appears well-developed and well-nourished.  Cardiovascular: Normal rate, regular rhythm, normal heart sounds and intact distal pulses.   No murmur heard. Pulmonary/Chest: Effort normal and breath sounds normal.  Neurological: He is alert.  Skin: Skin is warm and dry.   Results for orders placed in visit on 11/05/11  GLUCOSE, POCT (MANUAL RESULT ENTRY)      Component Value Range   POC Glucose 399    POCT GLYCOSYLATED HEMOGLOBIN (HGB A1C)      Component Value Range   Hemoglobin A1C 10.6            Assessment & Plan:  Diabetes, steroid induced.  Restart januvia.  No further insulin given here as already received 20 units before arrival.  Recheck with me on Monday.  Next infusion is Wed.  Discussed he may have to go on insulin if his sugar is still particularly elevated on Monday, but hopeful that he would not have to be on it long-term as long as sugars improve the longer he is off of steroids.  DUe to creatinine, no eligible for  metformin and also would avoid sulfonylureas. Keep dose of Januvia at 50 due to creatinine clearance.   Hypothyroid.  Resume synthroid 50 daily.  TSH and free T4 pending.

## 2011-11-05 NOTE — Patient Instructions (Signed)
[facility] Discharge Instructions for Patients Receiving Chemotherapy  Today you received the following chemotherapy agents Kyprolis.  To help prevent nausea and vomiting after your treatment, we encourage you to take your nausea medication.   If you develop nausea and vomiting that is not controlled by your nausea medication, call the clinic. If it is after clinic hours your family physician or the after hours number for the clinic or go to the Emergency Department.   BELOW ARE SYMPTOMS THAT SHOULD BE REPORTED IMMEDIATELY:  *FEVER GREATER THAN 100.5 F  *CHILLS WITH OR WITHOUT FEVER  NAUSEA AND VOMITING THAT IS NOT CONTROLLED WITH YOUR NAUSEA MEDICATION  *UNUSUAL SHORTNESS OF BREATH  *UNUSUAL BRUISING OR BLEEDING  TENDERNESS IN MOUTH AND THROAT WITH OR WITHOUT PRESENCE OF ULCERS  *URINARY PROBLEMS  *BOWEL PROBLEMS  UNUSUAL RASH Items with * indicate a potential emergency and should be followed up as soon as possible.  One of the nurses will contact you 24 hours after your treatment. Please let the nurse know about any problems that you may have experienced. Feel free to call the clinic you have any questions or concerns. The clinic phone number is [phone].   I have been informed and understand all the instructions given to me. I know to contact the clinic, my physician, or go to the Emergency Department if any problems should occur. I do not have any questions at this time, but understand that I may call the clinic during office hours   should I have any questions or need assistance in obtaining follow up care.    __________________________________________  _____________  __________ Signature of Patient or Authorized Representative            Date                   Time    __________________________________________ Nurse's Signature    

## 2011-11-06 LAB — TSH: TSH: 0.453 u[IU]/mL (ref 0.350–4.500)

## 2011-11-06 LAB — T4, FREE: Free T4: 1.31 ng/dL (ref 0.80–1.80)

## 2011-11-09 ENCOUNTER — Ambulatory Visit (INDEPENDENT_AMBULATORY_CARE_PROVIDER_SITE_OTHER): Payer: Medicare PPO | Admitting: Family Medicine

## 2011-11-09 VITALS — BP 137/75 | HR 89 | Temp 98.1°F | Resp 16 | Ht 70.0 in | Wt 226.2 lb

## 2011-11-09 DIAGNOSIS — IMO0001 Reserved for inherently not codable concepts without codable children: Secondary | ICD-10-CM

## 2011-11-09 LAB — GLUCOSE, POCT (MANUAL RESULT ENTRY): POC Glucose: 192

## 2011-11-09 MED ORDER — INSULIN PEN NEEDLE 31G X 5 MM MISC: 5.0000 [IU] | Freq: Every evening | Status: DC

## 2011-11-09 MED ORDER — INSULIN GLARGINE 100 UNIT/ML ~~LOC~~ SOLN: 5.0000 [IU] | Freq: Every day | SUBCUTANEOUS | Status: DC

## 2011-11-09 NOTE — Progress Notes (Signed)
  Subjective:    Patient ID: Arthur Valdez, male    DOB: Feb 04, 1933, 78 y.o.   MRN: [id number]  HPI 78 yo male with multiple myeloma, recently found to have steroid induced diabetes (or steroid worsened diabetes, really).  Had been on januvia in the past but had stopped it.  Restarted again last visit.  Not good candidate for metformin or sulfonylureas due to renal insufficiency.   Checked his sugar this morning and was over 300  Trying to watch diet  Has family members and neighbor down the street who is RN to help him with insulin if needed.  Review of Systems Negative except as per HPI     Objective:   Physical Exam  Constitutional: He appears well-developed and well-nourished.  Cardiovascular: Normal rate, regular rhythm, normal heart sounds and intact distal pulses.   No murmur heard. Pulmonary/Chest: Effort normal and breath sounds normal.  Neurological: He is alert.  Skin: Skin is warm and dry.      Results for orders placed in visit on 11/09/11  GLUCOSE, POCT (MANUAL RESULT ENTRY)      Component Value Range   POC Glucose 192         Assessment & Plan:  DM, uncontrolled.  Start lantus solostar.  Check sugars at night.  If between 125 and 250, take 5 units.  If over 250, take 8.  Follow-up in 1 week.  Want to keep sugars down but not drop him too low.  Hopeful that will be able to come off insulin in future, after off steroids for awhile.

## 2011-11-09 NOTE — Progress Notes (Signed)
Addended by: Lamar Laundry on: 11/09/2011 06:43 PM   Modules accepted: Orders

## 2011-11-11 ENCOUNTER — Ambulatory Visit (HOSPITAL_BASED_OUTPATIENT_CLINIC_OR_DEPARTMENT_OTHER): Payer: Medicare PPO

## 2011-11-11 ENCOUNTER — Other Ambulatory Visit (HOSPITAL_BASED_OUTPATIENT_CLINIC_OR_DEPARTMENT_OTHER): Payer: Medicare PPO | Admitting: Lab

## 2011-11-11 DIAGNOSIS — C9002 Multiple myeloma in relapse: Secondary | ICD-10-CM

## 2011-11-11 DIAGNOSIS — C9000 Multiple myeloma not having achieved remission: Secondary | ICD-10-CM

## 2011-11-11 DIAGNOSIS — Z5112 Encounter for antineoplastic immunotherapy: Secondary | ICD-10-CM

## 2011-11-11 LAB — BASIC METABOLIC PANEL
BUN: 17 mg/dL (ref 6–23)
Calcium: 8.7 mg/dL (ref 8.4–10.5)
Creatinine, Ser: 1.34 mg/dL (ref 0.50–1.35)

## 2011-11-11 LAB — CBC WITH DIFFERENTIAL (CANCER CENTER ONLY)
BASO#: 0 10*3/uL (ref 0.0–0.2)
EOS%: 1.5 % (ref 0.0–7.0)
LYMPH%: 17.2 % (ref 14.0–48.0)
MONO%: 13 % (ref 0.0–13.0)
NEUT%: 68.1 % (ref 40.0–80.0)
Platelets: 161 10*3/uL (ref 145–400)
RBC: 3.23 10*6/uL — ABNORMAL LOW (ref 4.20–5.70)
RDW: 15.8 % — ABNORMAL HIGH (ref 11.1–15.7)
WBC: 5.2 10*3/uL (ref 4.0–10.0)

## 2011-11-11 MED ORDER — SODIUM CHLORIDE 0.9 % IV SOLN: Freq: Once | INTRAVENOUS | Status: DC

## 2011-11-11 MED ORDER — SODIUM CHLORIDE 0.9 % IV SOLN
Freq: Once | INTRAVENOUS | Status: AC
  Administered 2011-11-11: 13:00:00 via INTRAVENOUS

## 2011-11-11 MED ORDER — DEXTROSE 5 % IV SOLN
60.0000 mg | Freq: Once | INTRAVENOUS | Status: AC
  Administered 2011-11-11: 60 mg via INTRAVENOUS
  Filled 2011-11-11: qty 30

## 2011-11-11 MED ORDER — ONDANSETRON 8 MG/50ML IVPB (CHCC)
8.0000 mg | Freq: Once | INTRAVENOUS | Status: AC
  Administered 2011-11-11: 8 mg via INTRAVENOUS

## 2011-11-11 NOTE — Patient Instructions (Signed)
[facility] Discharge Instructions for Patients Receiving Chemotherapy  Today you received the following chemotherapy agents Kyprolis.  To help prevent nausea and vomiting after your treatment, we encourage you to take your nausea medication.   If you develop nausea and vomiting that is not controlled by your nausea medication, call the clinic. If it is after clinic hours your family physician or the after hours number for the clinic or go to the Emergency Department.   BELOW ARE SYMPTOMS THAT SHOULD BE REPORTED IMMEDIATELY:  *FEVER GREATER THAN 100.5 F  *CHILLS WITH OR WITHOUT FEVER  NAUSEA AND VOMITING THAT IS NOT CONTROLLED WITH YOUR NAUSEA MEDICATION  *UNUSUAL SHORTNESS OF BREATH  *UNUSUAL BRUISING OR BLEEDING  TENDERNESS IN MOUTH AND THROAT WITH OR WITHOUT PRESENCE OF ULCERS  *URINARY PROBLEMS  *BOWEL PROBLEMS  UNUSUAL RASH Items with * indicate a potential emergency and should be followed up as soon as possible.  One of the nurses will contact you 24 hours after your treatment. Please let the nurse know about any problems that you may have experienced. Feel free to call the clinic you have any questions or concerns. The clinic phone number is [phone].   I have been informed and understand all the instructions given to me. I know to contact the clinic, my physician, or go to the Emergency Department if any problems should occur. I do not have any questions at this time, but understand that I may call the clinic during office hours   should I have any questions or need assistance in obtaining follow up care.    __________________________________________  _____________  __________ Signature of Patient or Authorized Representative            Date                   Time    __________________________________________ Nurse's Signature    

## 2011-11-12 ENCOUNTER — Telehealth: Payer: Self-pay

## 2011-11-12 ENCOUNTER — Ambulatory Visit (HOSPITAL_BASED_OUTPATIENT_CLINIC_OR_DEPARTMENT_OTHER): Payer: Medicare PPO

## 2011-11-12 ENCOUNTER — Ambulatory Visit: Payer: Medicare PPO

## 2011-11-12 ENCOUNTER — Other Ambulatory Visit (HOSPITAL_BASED_OUTPATIENT_CLINIC_OR_DEPARTMENT_OTHER): Payer: Medicare PPO | Admitting: Lab

## 2011-11-12 VITALS — BP 124/70 | HR 89 | Temp 97.5°F

## 2011-11-12 DIAGNOSIS — Z5112 Encounter for antineoplastic immunotherapy: Secondary | ICD-10-CM

## 2011-11-12 DIAGNOSIS — C9002 Multiple myeloma in relapse: Secondary | ICD-10-CM

## 2011-11-12 DIAGNOSIS — C9000 Multiple myeloma not having achieved remission: Secondary | ICD-10-CM

## 2011-11-12 LAB — BASIC METABOLIC PANEL - CANCER CENTER ONLY
CO2: 27 mEq/L (ref 18–33)
Calcium: 8.5 mg/dL (ref 8.0–10.3)
Chloride: 103 mEq/L (ref 98–108)
Sodium: 143 mEq/L (ref 128–145)

## 2011-11-12 MED ORDER — ZOLEDRONIC ACID 4 MG/5ML IV CONC
3.5000 mg | Freq: Once | INTRAVENOUS | Status: AC
  Administered 2011-11-12: 3.5 mg via INTRAVENOUS
  Filled 2011-11-12: qty 4.38

## 2011-11-12 MED ORDER — ONDANSETRON 8 MG/50ML IVPB (CHCC)
8.0000 mg | Freq: Once | INTRAVENOUS | Status: AC
  Administered 2011-11-12: 8 mg via INTRAVENOUS

## 2011-11-12 MED ORDER — SODIUM CHLORIDE 0.9 % IV SOLN
Freq: Once | INTRAVENOUS | Status: AC
  Administered 2011-11-12: 12:00:00 via INTRAVENOUS

## 2011-11-12 MED ORDER — DEXTROSE 5 % IV SOLN
60.0000 mg | Freq: Once | INTRAVENOUS | Status: AC
  Administered 2011-11-12: 60 mg via INTRAVENOUS
  Filled 2011-11-12: qty 30

## 2011-11-12 NOTE — Telephone Encounter (Signed)
.  [facility] Patient's wife called regarding husband's insulin pen.  Patient's wife states that he used his last lancet this morning and needs Rx for more lancets that fit the pen.  Patient's wife states that she would like this Rx called in to the [facility] on [address].

## 2011-11-12 NOTE — Patient Instructions (Signed)
Carfilzomib injection What is this medicine? CARFILZOMIB is a chemotherapy drug that works by slowing or stopping cancer cell growth. This medicine is used to treat multiple myeloma. This medicine may be used for other purposes; ask your health care provider or pharmacist if you have questions. What should I tell my health care provider before I take this medicine? They need to know if you have any of these conditions: -heart disease -irregular heartbeat -liver disease -lung or breathing disease -an unusual or allergic reaction to carfilzomib, or other medicines, foods, dyes, or preservatives -pregnant or trying to get pregnant -breast-feeding How should I use this medicine? This medicine is for injection or infusion into a vein. It is given by a health care professional in a hospital or clinic setting. Talk to your pediatrician regarding the use of this medicine in children. Special care may be needed. Overdosage: If you think you've taken too much of this medicine contact a poison control center or emergency room at once. Overdosage: If you think you have taken too much of this medicine contact a poison control center or emergency room at once. NOTE: This medicine is only for you. Do not share this medicine with others. What if I miss a dose? It is important not to miss your dose. Call your doctor or health care professional if you are unable to keep an appointment. What may interact with this medicine? Interactions are not expected. Give your health care provider a list of all the medicines, herbs, non-prescription drugs, or dietary supplements you use. Also tell them if you smoke, drink alcohol, or use illegal drugs. Some items may interact with your medicine. This list may not describe all possible interactions. Give your health care provider a list of all the medicines, herbs, non-prescription drugs, or dietary supplements you use. Also tell them if you smoke, drink alcohol, or use  illegal drugs. Some items may interact with your medicine. What should I watch for while using this medicine? Your condition will be monitored carefully while you are receiving this medicine. Report any side effects. Continue your course of treatment even though you feel ill unless your doctor tells you to stop. Call your doctor or health care professional for advice if you get a fever, chills or sore throat, or other symptoms of a cold or flu. Do not treat yourself. Try to avoid being around people who are sick. Do not become pregnant while taking this medicine. Women should inform their doctor if they wish to become pregnant or think they might be pregnant. There is a potential for serious side effects to an unborn child. Talk to your health care professional or pharmacist for more information. Do not breast-feed an infant while taking this medicine. Check with your doctor or health care professional if you get an attack of severe diarrhea, nausea and vomiting, or if you sweat a lot. The loss of too much body fluid can make it dangerous for you to take this medicine. You may get dizzy. Do not drive, use machinery, or do anything that needs mental alertness until you know how this medicine affects you. Do not stand or sit up quickly, especially if you are an older patient. This reduces the risk of dizzy or fainting spells. What side effects may I notice from receiving this medicine? Side effects that you should report to your doctor or health care professional as soon as possible: -allergic reactions like skin rash, itching or hives, swelling of the face, lips, or tongue -breathing  problems -chest pain or palpitationschest tightness -cough -dark urine -dizziness -feeling faint or lightheaded -fever or chills -general ill feeling or flu-like symptoms -light-colored stools -palpitations -right upper belly pain -swelling of the legs or ankles -unusual bleeding or bruising -unusually weak or  tired -yellowing of the eyes or skin  Side effects that usually do not require medical attention (Report these to your doctor or health care professional if they continue or are bothersome.): -diarrhea -headache -nausea, vomiting -tiredness This list may not describe all possible side effects. Call your doctor for medical advice about side effects. You may report side effects to FDA at 1-800-FDA-1088. Where should I keep my medicine? This drug is given in a hospital or clinic and will not be stored at home. NOTE: This sheet is a summary. It may not cover all possible information. If you have questions about this medicine, talk to your doctor, pharmacist, or health care provider.  2012, Elsevier/Gold Standard. (03/12/2011 4:35:08 PM)Zoledronic Acid injection (Hypercalcemia, Oncology) What is this medicine? ZOLEDRONIC ACID (ZOE le dron ik AS id) lowers the amount of calcium loss from bone. It is used to treat too much calcium in your blood from cancer. It is also used to prevent complications of cancer that has spread to the bone. This medicine may be used for other purposes; ask your health care provider or pharmacist if you have questions. What should I tell my health care provider before I take this medicine? They need to know if you have any of these conditions: -aspirin-sensitive asthma -dental disease -kidney disease -an unusual or allergic reaction to zoledronic acid, other medicines, foods, dyes, or preservatives -pregnant or trying to get pregnant -breast-feeding How should I use this medicine? This medicine is for infusion into a vein. It is given by a health care professional in a hospital or clinic setting. Talk to your pediatrician regarding the use of this medicine in children. Special care may be needed. Overdosage: If you think you have taken too much of this medicine contact a poison control center or emergency room at once. NOTE: This medicine is only for you. Do not share  this medicine with others. What if I miss a dose? It is important not to miss your dose. Call your doctor or health care professional if you are unable to keep an appointment. What may interact with this medicine? -certain antibiotics given by injection -NSAIDs, medicines for pain and inflammation, like ibuprofen or naproxen -some diuretics like bumetanide, furosemide -teriparatide -thalidomide This list may not describe all possible interactions. Give your health care provider a list of all the medicines, herbs, non-prescription drugs, or dietary supplements you use. Also tell them if you smoke, drink alcohol, or use illegal drugs. Some items may interact with your medicine. What should I watch for while using this medicine? Visit your doctor or health care professional for regular checkups. It may be some time before you see the benefit from this medicine. Do not stop taking your medicine unless your doctor tells you to. Your doctor may order blood tests or other tests to see how you are doing. Women should inform their doctor if they wish to become pregnant or think they might be pregnant. There is a potential for serious side effects to an unborn child. Talk to your health care professional or pharmacist for more information. You should make sure that you get enough calcium and vitamin D while you are taking this medicine. Discuss the foods you eat and the vitamins you take  with your health care professional. Some people who take this medicine have severe bone, joint, and/or muscle pain. This medicine may also increase your risk for a broken thigh bone. Tell your doctor right away if you have pain in your upper leg or groin. Tell your doctor if you have any pain that does not go away or that gets worse. What side effects may I notice from receiving this medicine? Side effects that you should report to your doctor or health care professional as soon as possible: -allergic reactions like skin  rash, itching or hives, swelling of the face, lips, or tongue -anxiety, confusion, or depression -breathing problems -changes in vision -feeling faint or lightheaded, falls -jaw burning, cramping, pain -muscle cramps, stiffness, or weakness -trouble passing urine or change in the amount of urine Side effects that usually do not require medical attention (report to your doctor or health care professional if they continue or are bothersome): -bone, joint, or muscle pain -fever -hair loss -irritation at site where injected -loss of appetite -nausea, vomiting -stomach upset -tired This list may not describe all possible side effects. Call your doctor for medical advice about side effects. You may report side effects to FDA at 1-800-FDA-1088. Where should I keep my medicine? This drug is given in a hospital or clinic and will not be stored at home. NOTE: This sheet is a summary. It may not cover all possible information. If you have questions about this medicine, talk to your doctor, pharmacist, or health care provider.  2012, Elsevier/Gold Standard. (01/30/2011 9:06:58 AM)

## 2011-11-12 NOTE — Telephone Encounter (Signed)
Can we call in rx for more lancets?

## 2011-11-12 NOTE — Telephone Encounter (Signed)
Does patient need more lancets or insulin pen needles?  Arthur Valdez

## 2011-11-13 NOTE — Telephone Encounter (Signed)
Spoke with pt and he already received what he needed

## 2011-11-16 ENCOUNTER — Other Ambulatory Visit: Payer: Self-pay | Admitting: *Deleted

## 2011-11-16 DIAGNOSIS — F419 Anxiety disorder, unspecified: Secondary | ICD-10-CM

## 2011-11-16 MED ORDER — LORAZEPAM 2 MG PO TABS: 2.0000 mg | ORAL_TABLET | Freq: Two times a day (BID) | ORAL | Status: DC | PRN

## 2011-11-16 NOTE — Telephone Encounter (Signed)
Received a refill request from [facility] for Ativan 2 mg. This is a chronic med for the pt. Will have Dr Myna Hidalgo sign a rx then fax back to [phone].

## 2011-11-18 ENCOUNTER — Ambulatory Visit (HOSPITAL_BASED_OUTPATIENT_CLINIC_OR_DEPARTMENT_OTHER): Payer: Medicare PPO

## 2011-11-18 ENCOUNTER — Other Ambulatory Visit (HOSPITAL_BASED_OUTPATIENT_CLINIC_OR_DEPARTMENT_OTHER): Payer: Medicare PPO | Admitting: Lab

## 2011-11-18 ENCOUNTER — Other Ambulatory Visit: Payer: Medicare PPO | Admitting: Lab

## 2011-11-18 ENCOUNTER — Ambulatory Visit: Payer: Medicare PPO

## 2011-11-18 VITALS — BP 121/72 | HR 93 | Temp 97.4°F

## 2011-11-18 DIAGNOSIS — C9000 Multiple myeloma not having achieved remission: Secondary | ICD-10-CM

## 2011-11-18 DIAGNOSIS — Z5112 Encounter for antineoplastic immunotherapy: Secondary | ICD-10-CM

## 2011-11-18 DIAGNOSIS — C9002 Multiple myeloma in relapse: Secondary | ICD-10-CM

## 2011-11-18 LAB — CBC WITH DIFFERENTIAL (CANCER CENTER ONLY)
BASO#: 0 10*3/uL (ref 0.0–0.2)
EOS%: 1.1 % (ref 0.0–7.0)
HCT: 27.6 % — ABNORMAL LOW (ref 38.7–49.9)
HGB: 9.1 g/dL — ABNORMAL LOW (ref 13.0–17.1)
LYMPH#: 0.7 10*3/uL — ABNORMAL LOW (ref 0.9–3.3)
MCHC: 33 g/dL (ref 32.0–35.9)
NEUT%: 74.4 % (ref 40.0–80.0)

## 2011-11-18 LAB — BASIC METABOLIC PANEL
BUN: 18 mg/dL (ref 6–23)
CO2: 25 mEq/L (ref 19–32)
Chloride: 106 mEq/L (ref 96–112)
Creatinine, Ser: 1.54 mg/dL — ABNORMAL HIGH (ref 0.50–1.35)
Glucose, Bld: 132 mg/dL — ABNORMAL HIGH (ref 70–99)

## 2011-11-18 MED ORDER — DEXTROSE 5 % IV SOLN
60.0000 mg | Freq: Once | INTRAVENOUS | Status: AC
  Administered 2011-11-18: 60 mg via INTRAVENOUS
  Filled 2011-11-18: qty 30

## 2011-11-18 MED ORDER — SODIUM CHLORIDE 0.9 % IV SOLN: Freq: Once | INTRAVENOUS | Status: DC

## 2011-11-18 MED ORDER — ONDANSETRON 8 MG/50ML IVPB (CHCC)
8.0000 mg | Freq: Once | INTRAVENOUS | Status: AC
  Administered 2011-11-18: 8 mg via INTRAVENOUS

## 2011-11-18 MED ORDER — SODIUM CHLORIDE 0.9 % IV SOLN
Freq: Once | INTRAVENOUS | Status: AC
  Administered 2011-11-18: 14:00:00 via INTRAVENOUS

## 2011-11-18 MED ORDER — SODIUM CHLORIDE 0.9 % IJ SOLN
10.0000 mL | INTRAMUSCULAR | Status: DC | PRN
  Filled 2011-11-18: qty 10

## 2011-11-19 ENCOUNTER — Ambulatory Visit (HOSPITAL_BASED_OUTPATIENT_CLINIC_OR_DEPARTMENT_OTHER): Payer: Medicare PPO

## 2011-11-19 VITALS — BP 132/77 | HR 88 | Temp 97.0°F

## 2011-11-19 DIAGNOSIS — Z5112 Encounter for antineoplastic immunotherapy: Secondary | ICD-10-CM

## 2011-11-19 DIAGNOSIS — C9000 Multiple myeloma not having achieved remission: Secondary | ICD-10-CM

## 2011-11-19 DIAGNOSIS — C9002 Multiple myeloma in relapse: Secondary | ICD-10-CM

## 2011-11-19 MED ORDER — ONDANSETRON 8 MG/50ML IVPB (CHCC)
8.0000 mg | Freq: Once | INTRAVENOUS | Status: AC
  Administered 2011-11-19: 8 mg via INTRAVENOUS

## 2011-11-19 MED ORDER — DEXTROSE 5 % IV SOLN
60.0000 mg | Freq: Once | INTRAVENOUS | Status: AC
  Administered 2011-11-19: 60 mg via INTRAVENOUS
  Filled 2011-11-19: qty 30

## 2011-11-19 MED ORDER — SODIUM CHLORIDE 0.9 % IV SOLN
Freq: Once | INTRAVENOUS | Status: AC
  Administered 2011-11-19: 11:00:00 via INTRAVENOUS

## 2011-11-19 MED ORDER — SODIUM CHLORIDE 0.9 % IV SOLN: Freq: Once | INTRAVENOUS | Status: DC

## 2011-11-19 NOTE — Patient Instructions (Signed)
Carfilzomib injection What is this medicine? CARFILZOMIB is a chemotherapy drug that works by slowing or stopping cancer cell growth. This medicine is used to treat multiple myeloma. This medicine may be used for other purposes; ask your health care provider or pharmacist if you have questions. What should I tell my health care provider before I take this medicine? They need to know if you have any of these conditions: -heart disease -irregular heartbeat -liver disease -lung or breathing disease -an unusual or allergic reaction to carfilzomib, or other medicines, foods, dyes, or preservatives -pregnant or trying to get pregnant -breast-feeding How should I use this medicine? This medicine is for injection or infusion into a vein. It is given by a health care professional in a hospital or clinic setting. Talk to your pediatrician regarding the use of this medicine in children. Special care may be needed. Overdosage: If you think you've taken too much of this medicine contact a poison control center or emergency room at once. Overdosage: If you think you have taken too much of this medicine contact a poison control center or emergency room at once. NOTE: This medicine is only for you. Do not share this medicine with others. What if I miss a dose? It is important not to miss your dose. Call your doctor or health care professional if you are unable to keep an appointment. What may interact with this medicine? Interactions are not expected. Give your health care provider a list of all the medicines, herbs, non-prescription drugs, or dietary supplements you use. Also tell them if you smoke, drink alcohol, or use illegal drugs. Some items may interact with your medicine. This list may not describe all possible interactions. Give your health care provider a list of all the medicines, herbs, non-prescription drugs, or dietary supplements you use. Also tell them if you smoke, drink alcohol, or use  illegal drugs. Some items may interact with your medicine. What should I watch for while using this medicine? Your condition will be monitored carefully while you are receiving this medicine. Report any side effects. Continue your course of treatment even though you feel ill unless your doctor tells you to stop. Call your doctor or health care professional for advice if you get a fever, chills or sore throat, or other symptoms of a cold or flu. Do not treat yourself. Try to avoid being around people who are sick. Do not become pregnant while taking this medicine. Women should inform their doctor if they wish to become pregnant or think they might be pregnant. There is a potential for serious side effects to an unborn child. Talk to your health care professional or pharmacist for more information. Do not breast-feed an infant while taking this medicine. Check with your doctor or health care professional if you get an attack of severe diarrhea, nausea and vomiting, or if you sweat a lot. The loss of too much body fluid can make it dangerous for you to take this medicine. You may get dizzy. Do not drive, use machinery, or do anything that needs mental alertness until you know how this medicine affects you. Do not stand or sit up quickly, especially if you are an older patient. This reduces the risk of dizzy or fainting spells. What side effects may I notice from receiving this medicine? Side effects that you should report to your doctor or health care professional as soon as possible: -allergic reactions like skin rash, itching or hives, swelling of the face, lips, or tongue -breathing   problems -chest pain or palpitationschest tightness -cough -dark urine -dizziness -feeling faint or lightheaded -fever or chills -general ill feeling or flu-like symptoms -light-colored stools -palpitations -right upper belly pain -swelling of the legs or ankles -unusual bleeding or bruising -unusually weak or  tired -yellowing of the eyes or skin  Side effects that usually do not require medical attention (Report these to your doctor or health care professional if they continue or are bothersome.): -diarrhea -headache -nausea, vomiting -tiredness This list may not describe all possible side effects. Call your doctor for medical advice about side effects. You may report side effects to FDA at 1-800-FDA-1088. Where should I keep my medicine? This drug is given in a hospital or clinic and will not be stored at home. NOTE: This sheet is a summary. It may not cover all possible information. If you have questions about this medicine, talk to your doctor, pharmacist, or health care provider.  2012, Elsevier/Gold Standard. (03/12/2011 4:35:08 PM) 

## 2011-11-28 ENCOUNTER — Other Ambulatory Visit: Payer: Self-pay | Admitting: Family Medicine

## 2011-12-02 ENCOUNTER — Telehealth: Payer: Self-pay | Admitting: Hematology & Oncology

## 2011-12-02 ENCOUNTER — Other Ambulatory Visit (HOSPITAL_BASED_OUTPATIENT_CLINIC_OR_DEPARTMENT_OTHER): Payer: Medicare PPO | Admitting: Lab

## 2011-12-02 ENCOUNTER — Other Ambulatory Visit: Payer: Medicare PPO | Admitting: Lab

## 2011-12-02 ENCOUNTER — Ambulatory Visit: Payer: Medicare PPO

## 2011-12-02 ENCOUNTER — Ambulatory Visit (HOSPITAL_BASED_OUTPATIENT_CLINIC_OR_DEPARTMENT_OTHER): Payer: Medicare PPO | Admitting: Hematology & Oncology

## 2011-12-02 ENCOUNTER — Ambulatory Visit (HOSPITAL_BASED_OUTPATIENT_CLINIC_OR_DEPARTMENT_OTHER): Payer: Medicare PPO

## 2011-12-02 ENCOUNTER — Other Ambulatory Visit: Payer: Self-pay | Admitting: *Deleted

## 2011-12-02 VITALS — BP 124/76 | HR 97 | Temp 97.6°F | Ht 71.0 in | Wt 229.0 lb

## 2011-12-02 DIAGNOSIS — C9002 Multiple myeloma in relapse: Secondary | ICD-10-CM

## 2011-12-02 DIAGNOSIS — E119 Type 2 diabetes mellitus without complications: Secondary | ICD-10-CM

## 2011-12-02 DIAGNOSIS — J40 Bronchitis, not specified as acute or chronic: Secondary | ICD-10-CM

## 2011-12-02 DIAGNOSIS — C9000 Multiple myeloma not having achieved remission: Secondary | ICD-10-CM

## 2011-12-02 DIAGNOSIS — Z5112 Encounter for antineoplastic immunotherapy: Secondary | ICD-10-CM

## 2011-12-02 DIAGNOSIS — D631 Anemia in chronic kidney disease: Secondary | ICD-10-CM

## 2011-12-02 LAB — CBC WITH DIFFERENTIAL (CANCER CENTER ONLY)
BASO%: 0.2 % (ref 0.0–2.0)
EOS%: 2.1 % (ref 0.0–7.0)
HCT: 28.1 % — ABNORMAL LOW (ref 38.7–49.9)
LYMPH#: 0.9 10*3/uL (ref 0.9–3.3)
MCHC: 33.1 g/dL (ref 32.0–35.9)
MONO#: 0.7 10*3/uL (ref 0.1–0.9)
NEUT#: 3.6 10*3/uL (ref 1.5–6.5)
NEUT%: 68.6 % (ref 40.0–80.0)
Platelets: 238 10*3/uL (ref 145–400)
RDW: 16 % — ABNORMAL HIGH (ref 11.1–15.7)
WBC: 5.2 10*3/uL (ref 4.0–10.0)

## 2011-12-02 MED ORDER — ONDANSETRON 8 MG/50ML IVPB (CHCC)
8.0000 mg | Freq: Once | INTRAVENOUS | Status: AC
  Administered 2011-12-02: 8 mg via INTRAVENOUS

## 2011-12-02 MED ORDER — SODIUM CHLORIDE 0.9 % IV SOLN
Freq: Once | INTRAVENOUS | Status: AC
  Administered 2011-12-02: 14:00:00 via INTRAVENOUS

## 2011-12-02 MED ORDER — DEXTROSE 5 % IV SOLN
60.0000 mg | Freq: Once | INTRAVENOUS | Status: AC
  Administered 2011-12-02: 60 mg via INTRAVENOUS
  Filled 2011-12-02: qty 30

## 2011-12-02 NOTE — Telephone Encounter (Signed)
Pt aware of 12-09-11 [place] at 830 am at [facility]. He is aware to be NPO after midnight and not to take insulin in the morning. He is also aware he will need a driver and to come to his treatment appointment the same afternoon, they will leave the needle in.

## 2011-12-02 NOTE — Patient Instructions (Signed)
[facility] Discharge Instructions for Patients Receiving Chemotherapy  Today you received the following chemotherapy agents Kyprolis.  To help prevent nausea and vomiting after your treatment, we encourage you to take your nausea medication.   If you develop nausea and vomiting that is not controlled by your nausea medication, call the clinic. If it is after clinic hours your family physician or the after hours number for the clinic or go to the Emergency Department.   BELOW ARE SYMPTOMS THAT SHOULD BE REPORTED IMMEDIATELY:  *FEVER GREATER THAN 100.5 F  *CHILLS WITH OR WITHOUT FEVER  NAUSEA AND VOMITING THAT IS NOT CONTROLLED WITH YOUR NAUSEA MEDICATION  *UNUSUAL SHORTNESS OF BREATH  *UNUSUAL BRUISING OR BLEEDING  TENDERNESS IN MOUTH AND THROAT WITH OR WITHOUT PRESENCE OF ULCERS  *URINARY PROBLEMS  *BOWEL PROBLEMS  UNUSUAL RASH Items with * indicate a potential emergency and should be followed up as soon as possible.  One of the nurses will contact you 24 hours after your treatment. Please let the nurse know about any problems that you may have experienced. Feel free to call the clinic you have any questions or concerns. The clinic phone number is [phone].   I have been informed and understand all the instructions given to me. I know to contact the clinic, my physician, or go to the Emergency Department if any problems should occur. I do not have any questions at this time, but understand that I may call the clinic during office hours   should I have any questions or need assistance in obtaining follow up care.    __________________________________________  _____________  __________ Signature of Patient or Authorized Representative            Date                   Time    __________________________________________ Nurse's Signature    

## 2011-12-02 NOTE — Telephone Encounter (Signed)
Talked with [facility] is tied up on a case and Inetta Fermo is not in. They took down information and will call patient for appointment. I will follow up in the am. Patient aware.

## 2011-12-02 NOTE — Progress Notes (Signed)
This office note has been dictated.

## 2011-12-03 ENCOUNTER — Ambulatory Visit (HOSPITAL_BASED_OUTPATIENT_CLINIC_OR_DEPARTMENT_OTHER): Payer: Medicare PPO

## 2011-12-03 ENCOUNTER — Other Ambulatory Visit: Payer: Self-pay | Admitting: Radiology

## 2011-12-03 VITALS — BP 127/74 | HR 87 | Temp 97.2°F

## 2011-12-03 DIAGNOSIS — Z5112 Encounter for antineoplastic immunotherapy: Secondary | ICD-10-CM

## 2011-12-03 DIAGNOSIS — C9002 Multiple myeloma in relapse: Secondary | ICD-10-CM

## 2011-12-03 DIAGNOSIS — C9000 Multiple myeloma not having achieved remission: Secondary | ICD-10-CM

## 2011-12-03 MED ORDER — DEXTROSE 5 % IV SOLN
60.0000 mg | Freq: Once | INTRAVENOUS | Status: AC
  Administered 2011-12-03: 60 mg via INTRAVENOUS
  Filled 2011-12-03: qty 30

## 2011-12-03 MED ORDER — SODIUM CHLORIDE 0.9 % IV SOLN: Freq: Once | INTRAVENOUS | Status: DC

## 2011-12-03 MED ORDER — ONDANSETRON 8 MG/50ML IVPB (CHCC)
8.0000 mg | Freq: Once | INTRAVENOUS | Status: AC
  Administered 2011-12-03: 8 mg via INTRAVENOUS

## 2011-12-03 MED ORDER — SODIUM CHLORIDE 0.9 % IV SOLN
Freq: Once | INTRAVENOUS | Status: AC
  Administered 2011-12-03: 12:00:00 via INTRAVENOUS

## 2011-12-03 NOTE — Progress Notes (Signed)
CC:   Arthur Valdez, M.D.  DIAGNOSES: 1. Recurrent lambda light chain myeloma. 2. Steroid-induced hyperglycemia/diabetes.  CURRENT THERAPY:  The patient is status post, I think 3 cycles of Kyprolis.  INTERIM HISTORY:  Arthur Valdez comes in for followup.  His blood sugars are doing better.  He is now on a new medication for this.  His last blood sugar about 3 weeks ago was 176.  His light chains are improving.  His lambda light chain back in early March was 147.  He has had no issues with his kidneys.  His last BUN and creatinine 2 weeks ago was 18 and 1.54.  His blood sugar back on 11/18/2011 was 132.  He is having no urinary frequency.  He is having no leg swelling.  There have been no rashes.  PHYSICAL EXAMINATION:  General:  This is a well-developed, well- nourished black gentleman in no obvious distress.  Vital Signs:  Show a temperature of 97.6, pulse 97, respiratory rate 18, blood pressure 124/76, weight is 229.  Head and Neck Exam:  Shows a normocephalic, atraumatic skull.  There are no ocular or oral lesions.  There are no palpable cervical or supraclavicular lymph nodes.  Lungs:  Clear bilaterally.  Cardiac Exam:  Regular rate and rhythm with a normal S1 and S2.  There are no murmurs, rubs, or bruits.  Abdominal Exam:  Soft with good bowel sounds.  There is no palpable abdominal mass.  There is no fluid wave.  No palpable hepatosplenomegaly.  Back Exam:  No tenderness over the spine, ribs, or hips.  Extremities:  Show no clubbing, cyanosis, or edema.  Neurological Exam:  Shows no focal neurological deficits.  LABORATORY STUDIES:  White cell count 5.2, hemoglobin 9.3, hematocrit 28.1, platelet count 238.  IMPRESSION:  Arthur Valdez is a 78 year old African American gentleman with recurrent lambda light chain myeloma.  He is responding, slowly but surely.  Hopefully, we will continue to see a response to treatment.  I am glad that his blood sugars at least are doing  better.  I think this clearly is improving his quality of life.  We will go ahead and plan to treat him with his 4th cycle this month.  I will go ahead and plan to see him back in 1 month.    ______________________________ [name], M.D. PRE/MEDQ  D:  12/02/2011  T:  12/03/2011  Job:  [id number]

## 2011-12-03 NOTE — Patient Instructions (Signed)
Carfilzomib injection What is this medicine? CARFILZOMIB is a chemotherapy drug that works by slowing or stopping cancer cell growth. This medicine is used to treat multiple myeloma. This medicine may be used for other purposes; ask your health care provider or pharmacist if you have questions. What should I tell my health care provider before I take this medicine? They need to know if you have any of these conditions: -heart disease -irregular heartbeat -liver disease -lung or breathing disease -an unusual or allergic reaction to carfilzomib, or other medicines, foods, dyes, or preservatives -pregnant or trying to get pregnant -breast-feeding How should I use this medicine? This medicine is for injection or infusion into a vein. It is given by a health care professional in a hospital or clinic setting. Talk to your pediatrician regarding the use of this medicine in children. Special care may be needed. Overdosage: If you think you've taken too much of this medicine contact a poison control center or emergency room at once. Overdosage: If you think you have taken too much of this medicine contact a poison control center or emergency room at once. NOTE: This medicine is only for you. Do not share this medicine with others. What if I miss a dose? It is important not to miss your dose. Call your doctor or health care professional if you are unable to keep an appointment. What may interact with this medicine? Interactions are not expected. Give your health care provider a list of all the medicines, herbs, non-prescription drugs, or dietary supplements you use. Also tell them if you smoke, drink alcohol, or use illegal drugs. Some items may interact with your medicine. This list may not describe all possible interactions. Give your health care provider a list of all the medicines, herbs, non-prescription drugs, or dietary supplements you use. Also tell them if you smoke, drink alcohol, or use  illegal drugs. Some items may interact with your medicine. What should I watch for while using this medicine? Your condition will be monitored carefully while you are receiving this medicine. Report any side effects. Continue your course of treatment even though you feel ill unless your doctor tells you to stop. Call your doctor or health care professional for advice if you get a fever, chills or sore throat, or other symptoms of a cold or flu. Do not treat yourself. Try to avoid being around people who are sick. Do not become pregnant while taking this medicine. Women should inform their doctor if they wish to become pregnant or think they might be pregnant. There is a potential for serious side effects to an unborn child. Talk to your health care professional or pharmacist for more information. Do not breast-feed an infant while taking this medicine. Check with your doctor or health care professional if you get an attack of severe diarrhea, nausea and vomiting, or if you sweat a lot. The loss of too much body fluid can make it dangerous for you to take this medicine. You may get dizzy. Do not drive, use machinery, or do anything that needs mental alertness until you know how this medicine affects you. Do not stand or sit up quickly, especially if you are an older patient. This reduces the risk of dizzy or fainting spells. What side effects may I notice from receiving this medicine? Side effects that you should report to your doctor or health care professional as soon as possible: -allergic reactions like skin rash, itching or hives, swelling of the face, lips, or tongue -breathing   problems -chest pain or palpitationschest tightness -cough -dark urine -dizziness -feeling faint or lightheaded -fever or chills -general ill feeling or flu-like symptoms -light-colored stools -palpitations -right upper belly pain -swelling of the legs or ankles -unusual bleeding or bruising -unusually weak or  tired -yellowing of the eyes or skin  Side effects that usually do not require medical attention (Report these to your doctor or health care professional if they continue or are bothersome.): -diarrhea -headache -nausea, vomiting -tiredness This list may not describe all possible side effects. Call your doctor for medical advice about side effects. You may report side effects to FDA at 1-800-FDA-1088. Where should I keep my medicine? This drug is given in a hospital or clinic and will not be stored at home. NOTE: This sheet is a summary. It may not cover all possible information. If you have questions about this medicine, talk to your doctor, pharmacist, or health care provider.  2012, Elsevier/Gold Standard. (03/12/2011 4:35:08 PM) 

## 2011-12-04 LAB — PROTEIN ELECTROPHORESIS, SERUM, WITH REFLEX
Albumin ELP: 62.2 % (ref 55.8–66.1)
Alpha-1-Globulin: 6.9 % — ABNORMAL HIGH (ref 2.9–4.9)
Gamma Globulin: 6.9 % — ABNORMAL LOW (ref 11.1–18.8)

## 2011-12-04 LAB — IGG, IGA, IGM
IgA: 58 mg/dL — ABNORMAL LOW (ref 68–379)
IgG (Immunoglobin G), Serum: 479 mg/dL — ABNORMAL LOW (ref 650–1600)
IgM, Serum: 23 mg/dL — ABNORMAL LOW (ref 41–251)

## 2011-12-04 LAB — KAPPA/LAMBDA LIGHT CHAINS
Kappa free light chain: 0.69 mg/dL (ref 0.33–1.94)
Kappa:Lambda Ratio: 0 — ABNORMAL LOW (ref 0.26–1.65)

## 2011-12-08 ENCOUNTER — Telehealth (HOSPITAL_COMMUNITY): Payer: Self-pay | Admitting: Hematology & Oncology

## 2011-12-08 ENCOUNTER — Encounter (HOSPITAL_COMMUNITY): Payer: Self-pay | Admitting: Pharmacy Technician

## 2011-12-09 ENCOUNTER — Ambulatory Visit: Payer: Medicare PPO

## 2011-12-09 ENCOUNTER — Other Ambulatory Visit: Payer: Medicare PPO | Admitting: Lab

## 2011-12-09 ENCOUNTER — Other Ambulatory Visit: Payer: Self-pay | Admitting: *Deleted

## 2011-12-09 ENCOUNTER — Encounter (HOSPITAL_COMMUNITY): Payer: Self-pay

## 2011-12-09 ENCOUNTER — Ambulatory Visit (HOSPITAL_COMMUNITY)
Admission: RE | Admit: 2011-12-09 | Discharge: 2011-12-09 | Disposition: A | Discharge status: Home / Self Care | Payer: Medicare PPO | Source: Ambulatory Visit | Attending: Hematology & Oncology | Admitting: Hematology & Oncology

## 2011-12-09 ENCOUNTER — Ambulatory Visit (HOSPITAL_BASED_OUTPATIENT_CLINIC_OR_DEPARTMENT_OTHER): Payer: Medicare PPO

## 2011-12-09 ENCOUNTER — Other Ambulatory Visit: Payer: Self-pay | Admitting: Hematology & Oncology

## 2011-12-09 ENCOUNTER — Other Ambulatory Visit (HOSPITAL_BASED_OUTPATIENT_CLINIC_OR_DEPARTMENT_OTHER): Payer: Medicare PPO | Admitting: Lab

## 2011-12-09 ENCOUNTER — Ambulatory Visit (HOSPITAL_BASED_OUTPATIENT_CLINIC_OR_DEPARTMENT_OTHER): Payer: Medicare PPO | Admitting: Hematology & Oncology

## 2011-12-09 VITALS — BP 145/89 | HR 64 | Temp 98.1°F | Resp 14

## 2011-12-09 DIAGNOSIS — C9002 Multiple myeloma in relapse: Secondary | ICD-10-CM | POA: Insufficient documentation

## 2011-12-09 DIAGNOSIS — Z87891 Personal history of nicotine dependence: Secondary | ICD-10-CM | POA: Insufficient documentation

## 2011-12-09 DIAGNOSIS — C9000 Multiple myeloma not having achieved remission: Secondary | ICD-10-CM

## 2011-12-09 DIAGNOSIS — Y849 Medical procedure, unspecified as the cause of abnormal reaction of the patient, or of later complication, without mention of misadventure at the time of the procedure: Secondary | ICD-10-CM | POA: Insufficient documentation

## 2011-12-09 DIAGNOSIS — E119 Type 2 diabetes mellitus without complications: Secondary | ICD-10-CM

## 2011-12-09 DIAGNOSIS — T82598A Other mechanical complication of other cardiac and vascular devices and implants, initial encounter: Secondary | ICD-10-CM | POA: Insufficient documentation

## 2011-12-09 DIAGNOSIS — I10 Essential (primary) hypertension: Secondary | ICD-10-CM | POA: Insufficient documentation

## 2011-12-09 DIAGNOSIS — Z79899 Other long term (current) drug therapy: Secondary | ICD-10-CM | POA: Insufficient documentation

## 2011-12-09 HISTORY — DX: Essential (primary) hypertension: I10

## 2011-12-09 LAB — CBC WITH DIFFERENTIAL (CANCER CENTER ONLY)
BASO#: 0 10e3/uL (ref 0.0–0.2)
BASO%: 0.2 % (ref 0.0–2.0)
EOS%: 1.4 % (ref 0.0–7.0)
Eosinophils Absolute: 0.1 10e3/uL (ref 0.0–0.5)
HCT: 27.9 % — ABNORMAL LOW (ref 38.7–49.9)
HGB: 9.2 g/dL — ABNORMAL LOW (ref 13.0–17.1)
LYMPH#: 1 10e3/uL (ref 0.9–3.3)
LYMPH%: 17.5 % (ref 14.0–48.0)
MCH: 30.9 pg (ref 28.0–33.4)
MCHC: 33 g/dL (ref 32.0–35.9)
MCV: 94 fL (ref 82–98)
MONO#: 0.7 10e3/uL (ref 0.1–0.9)
MONO%: 11.8 % (ref 0.0–13.0)
NEUT#: 3.9 10e3/uL (ref 1.5–6.5)
NEUT%: 69.1 % (ref 40.0–80.0)
Platelets: 172 10e3/uL (ref 145–400)
RBC: 2.98 10e6/uL — ABNORMAL LOW (ref 4.20–5.70)
RDW: 16 % — ABNORMAL HIGH (ref 11.1–15.7)
WBC: 5.7 10e3/uL (ref 4.0–10.0)

## 2011-12-09 LAB — CBC
HCT: 29 % — ABNORMAL LOW (ref 39.0–52.0)
Hemoglobin: 9.4 g/dL — ABNORMAL LOW (ref 13.0–17.0)
MCH: 30 pg (ref 26.0–34.0)
MCHC: 32.4 g/dL (ref 30.0–36.0)
MCV: 92.7 fL (ref 78.0–100.0)
Platelets: 198 K/uL (ref 150–400)
RBC: 3.13 MIL/uL — ABNORMAL LOW (ref 4.22–5.81)
RDW: 16.2 % — ABNORMAL HIGH (ref 11.5–15.5)
WBC: 6.3 K/uL (ref 4.0–10.5)

## 2011-12-09 LAB — BASIC METABOLIC PANEL
CO2: 21 mEq/L (ref 19–32)
Calcium: 9 mg/dL (ref 8.4–10.5)
Chloride: 106 mEq/L (ref 96–112)
Creatinine, Ser: 1.45 mg/dL — ABNORMAL HIGH (ref 0.50–1.35)
GFR calc Af Amer: 52 mL/min — ABNORMAL LOW (ref >90)
Sodium: 139 mEq/L (ref 135–145)

## 2011-12-09 LAB — PROTIME-INR
INR: 0.99 (ref 0.00–1.49)
Prothrombin Time: 13.3 s (ref 11.6–15.2)

## 2011-12-09 LAB — BASIC METABOLIC PANEL WITH GFR
BUN: 19 mg/dL (ref 6–23)
CO2: 22 meq/L (ref 19–32)
Calcium: 8.3 mg/dL — ABNORMAL LOW (ref 8.4–10.5)
Chloride: 111 meq/L (ref 96–112)
Creatinine, Ser: 1.49 mg/dL — ABNORMAL HIGH (ref 0.50–1.35)
Glucose, Bld: 152 mg/dL — ABNORMAL HIGH (ref 70–99)
Potassium: 3.9 meq/L (ref 3.5–5.3)
Sodium: 142 meq/L (ref 135–145)

## 2011-12-09 LAB — APTT: aPTT: 34 seconds (ref 24–37)

## 2011-12-09 MED ORDER — CLARITHROMYCIN 500 MG PO TABS: 500.0000 mg | ORAL_TABLET | Freq: Two times a day (BID) | ORAL | Status: DC

## 2011-12-09 MED ORDER — MIDAZOLAM HCL 5 MG/5ML IJ SOLN
INTRAMUSCULAR | Status: AC | PRN
  Administered 2011-12-09: 1 mg via INTRAVENOUS
  Administered 2011-12-09: 2 mg via INTRAVENOUS

## 2011-12-09 MED ORDER — DEXAMETHASONE 4 MG PO TABS: ORAL_TABLET | ORAL | Status: DC

## 2011-12-09 MED ORDER — SODIUM CHLORIDE 0.9 % IV SOLN
Freq: Once | INTRAVENOUS | Status: AC
  Administered 2011-12-09: 10:00:00 via INTRAVENOUS

## 2011-12-09 MED ORDER — HEPARIN SOD (PORK) LOCK FLUSH 100 UNIT/ML IV SOLN
500.0000 [IU] | Freq: Once | INTRAVENOUS | Status: AC | PRN
  Administered 2011-12-09: 500 [IU] via INTRAVENOUS
  Filled 2011-12-09: qty 5

## 2011-12-09 MED ORDER — FENTANYL CITRATE 0.05 MG/ML IJ SOLN
INTRAMUSCULAR | Status: AC | PRN
  Administered 2011-12-09: 50 ug via INTRAVENOUS
  Administered 2011-12-09: 100 ug via INTRAVENOUS

## 2011-12-09 MED ORDER — SODIUM CHLORIDE 0.9 % IJ SOLN
10.0000 mL | INTRAMUSCULAR | Status: DC | PRN
  Administered 2011-12-09: 10 mL via INTRAVENOUS
  Filled 2011-12-09: qty 10

## 2011-12-09 MED ORDER — ZOLEDRONIC ACID 4 MG/5ML IV CONC
3.5000 mg | Freq: Once | INTRAVENOUS | Status: AC
  Administered 2011-12-09: 3.5 mg via INTRAVENOUS
  Filled 2011-12-09: qty 4.38

## 2011-12-09 MED ORDER — CEFAZOLIN SODIUM-DEXTROSE 2-3 GM-% IV SOLR
2.0000 g | Freq: Once | INTRAVENOUS | Status: AC
  Administered 2011-12-09: 2 g via INTRAVENOUS

## 2011-12-09 NOTE — Discharge Instructions (Signed)
Implanted Port Instructions  An implanted port is a central line that has a round shape and is placed under the skin. It is used for long-term IV (intravenous) access for:   Medicine.   Fluids.   Liquid nutrition, such as TPN (total parenteral nutrition).   Blood samples.  Ports can be placed:   In the chest area just below the collarbone (this is the most common place.)   In the arms.   In the belly (abdomen) area.   In the legs.  PARTS OF THE PORT  A port has 2 main parts:   The reservoir. The reservoir is round, disc-shaped, and will be a small, raised area under your skin.   The reservoir is the part where a needle is inserted (accessed) to either give medicines or to draw blood.   The catheter. The catheter is a long, slender tube that extends from the reservoir. The catheter is placed into a large vein.   Medicine that is inserted into the reservoir goes into the catheter and then into the vein.  INSERTION OF THE PORT   The port is surgically placed in either an operating room or in a procedural area (interventional radiology).   Medicine may be given to help you relax during the procedure.   The skin where the port will be inserted is numbed (local anesthetic).   1 or 2 small cuts (incisions) will be made in the skin to insert the port.   The port can be used after it has been inserted.  INCISION SITE CARE   The incision site may have small adhesive strips on it. This helps keep the incision site closed. Sometimes, no adhesive strips are placed. Instead of adhesive strips, a special kind of surgical glue is used to keep the incision closed.   If adhesive strips were placed on the incision sites, do not take them off. They will fall off on their own.   The incision site may be sore for 1 to 2 days. Pain medicine can help.   Do not get the incision site wet. Bathe or shower as directed by your caregiver.   The incision site should heal in 5 to 7 days. A small scar may form after the  incision has healed.  ACCESSING THE PORT  Special steps must be taken to access the port:   Before the port is accessed, a numbing cream can be placed on the skin. This helps numb the skin over the port site.   A sterile technique is used to access the port.   The port is accessed with a needle. Only "non-coring" port needles should be used to access the port. Once the port is accessed, a blood return should be checked. This helps ensure the port is in the vein and is not clogged (clotted).   If your caregiver believes your port should remain accessed, a clear (transparent) bandage will be placed over the needle site. The bandage and needle will need to be changed every week or as directed by your caregiver.   Keep the bandage covering the needle clean and dry. Do not get it wet. Follow your caregiver's instructions on how to take a shower or bath when the port is accessed.   If your port does not need to stay accessed, no bandage is needed over the port.  FLUSHING THE PORT  Flushing the port keeps it from getting clogged. How often the port is flushed depends on:   If a   constant infusion is running. If a constant infusion is running, the port may not need to be flushed.   If intermittent medicines are given.   If the port is not being used.  For intermittent medicines:   The port will need to be flushed:   After medicines have been given.   After blood has been drawn.   As part of routine maintenance.   A port is normally flushed with:   Normal saline.   Heparin.   Follow your caregiver's advice on how often, how much, and the type of flush to use on your port.  IMPORTANT PORT INFORMATION   Tell your caregiver if you are allergic to heparin.   After your port is placed, you will get a manufacturer's information card. The card has information about your port. Keep this card with you at all times.   There are many types of ports available. Know what kind of port you have.   In case of an  emergency, it may be helpful to wear a medical alert bracelet. This can help alert health care workers that you have a port.   The port can stay in for as long as your caregiver believes it is necessary.   When it is time for the port to come out, surgery will be done to remove it. The surgery will be similar to how the port was put in.   If you are in the hospital or clinic:   Your port will be taken care of and flushed by a nurse.   If you are at home:   A home health care nurse may give medicines and take care of the port.   You or a family member can get special training and directions for giving medicine and taking care of the port at home.  SEEK IMMEDIATE MEDICAL CARE IF:    Your port does not flush or you are unable to get a blood return.   New drainage or pus is coming from the incision.   A bad smell is coming from the incision site.   You develop swelling or increased redness at the incision site.   You develop increased swelling or pain at the port site.   You develop swelling or pain in the surrounding skin near the port.   You have an oral temperature above 102 F (38.9 C), not controlled by medicine.  MAKE SURE YOU:    Understand these instructions.   Will watch your condition.   Will get help right away if you are not doing well or get worse.  Document Released: 08/03/2005 Document Revised: 07/23/2011 Document Reviewed: 10/25/2008  [employer] Patient Information 2012 [employer].          Moderate Sedation, Adult  Moderate sedation is given to help you relax or even sleep through a procedure. You may remain sleepy, be clumsy, or have poor balance for several hours following this procedure. Arrange for a responsible adult, family member, or friend to take you home. A responsible adult should stay with you for at least 24 hours or until the medicines have worn off.   Do not participate in any activities where you could become injured for the next 24 hours, or until you feel normal  again. Do not:   Drive.   Swim.   Ride a bicycle.   Operate heavy machinery.   Cook.   Use power tools.   Climb ladders.   Work at heights.   Do not   make important decisions or sign legal documents until you are improved.   Vomiting may occur if you eat too soon. When you can drink without vomiting, try water, juice, or soup. Try solid foods if you feel little or no nausea.   Only take over-the-counter or prescription medications for pain, discomfort, or fever as directed by your caregiver.If pain medications have been prescribed for you, ask your caregiver how soon it is safe to take them.   Make sure you and your family fully understands everything about the medication given to you. Make sure you understand what side effects may occur.   You should not drink alcohol, take sleeping pills, or medications that cause drowsiness for at least 24 hours.   If you smoke, do not smoke alone.   If you are feeling better, you may resume normal activities 24 hours after receiving sedation.   Keep all appointments as scheduled. Follow all instructions.   Ask questions if you do not understand.  SEEK MEDICAL CARE IF:    Your skin is pale or bluish in color.   You continue to feel sick to your stomach (nauseous) or throw up (vomit).   Your pain is getting worse and not helped by medication.   You have bleeding or swelling.   You are still sleepy or feeling clumsy after 24 hours.  SEEK IMMEDIATE MEDICAL CARE IF:    You develop a rash.   You have difficulty breathing.   You develop any type of allergic problem.   You have a fever.  Document Released: 04/28/2001 Document Revised: 07/23/2011 Document Reviewed: 09/19/2007  [employer] Patient Information 2012 [employer].

## 2011-12-09 NOTE — Patient Instructions (Addendum)
[facility] Discharge Instructions for Patients to Receive Oral Chemotherapy  You will be starting Pomalyst (pomalidomide).   This should come in the mail through a mail order pharmacy. The process for obtaining the medication will be similar to Revlimid. You will need to complete an initial Patient Survey with [employer] over the phone [phone] or via the Internet [email or web address].    Please call the office when you receive the medication. We will need to set you up with weekly blood counts for 8 weeks then   monthly thereafter.   You will also take Decadron 20 mg once a week (5 - 4 mg tabs) with food and Clarithromycin 500 mg twice daily.   The most common side effects of Pomalyst are:  *tiredness *weakness *constipation *shortness of breath *diarrhea *fever *back pain *back pain *nausea       Please let the nurse know about any problems that you may have experienced. Feel free to call the clinic you have any questions or concerns. The clinic phone number is [phone].   I have been informed and understand all the instructions given to me. I know to contact the clinic, my physician, or go to the Emergency Department if any problems should occur. I do not have any questions at this time, but understand that I may call the clinic during office hours   should I have any questions or need assistance in obtaining follow up care.    __________________________________________  _____________  __________ Signature of Patient or Authorized Representative            Date                   Time    __________________________________________ Nurse's Signature      Zoledronic Acid injection (Hypercalcemia, Oncology) What is this medicine? ZOLEDRONIC ACID (ZOE le dron ik AS id) lowers the amount of calcium loss from bone. It is used to treat too much calcium in your blood from cancer. It is also used to prevent complications of cancer that has  spread to the bone. This medicine may be used for other purposes; ask your health care provider or pharmacist if you have questions. What should I tell my health care provider before I take this medicine? They need to know if you have any of these conditions: -aspirin-sensitive asthma -dental disease -kidney disease -an unusual or allergic reaction to zoledronic acid, other medicines, foods, dyes, or preservatives -pregnant or trying to get pregnant -breast-feeding How should I use this medicine? This medicine is for infusion into a vein. It is given by a health care professional in a hospital or clinic setting. Talk to your pediatrician regarding the use of this medicine in children. Special care may be needed. Overdosage: If you think you have taken too much of this medicine contact a poison control center or emergency room at once. NOTE: This medicine is only for you. Do not share this medicine with others. What if I miss a dose? It is important not to miss your dose. Call your doctor or health care professional if you are unable to keep an appointment. What may interact with this medicine? -certain antibiotics given by injection -NSAIDs, medicines for pain and inflammation, like ibuprofen or naproxen -some diuretics like bumetanide, furosemide -teriparatide -thalidomide This list may not describe all possible interactions. Give your health care provider a list of all the medicines, herbs, non-prescription drugs, or dietary supplements you use. Also tell them if you smoke,  drink alcohol, or use illegal drugs. Some items may interact with your medicine. What should I watch for while using this medicine? Visit your doctor or health care professional for regular checkups. It may be some time before you see the benefit from this medicine. Do not stop taking your medicine unless your doctor tells you to. Your doctor may order blood tests or other tests to see how you are doing. Women should  inform their doctor if they wish to become pregnant or think they might be pregnant. There is a potential for serious side effects to an unborn child. Talk to your health care professional or pharmacist for more information. You should make sure that you get enough calcium and vitamin D while you are taking this medicine. Discuss the foods you eat and the vitamins you take with your health care professional. Some people who take this medicine have severe bone, joint, and/or muscle pain. This medicine may also increase your risk for a broken thigh bone. Tell your doctor right away if you have pain in your upper leg or groin. Tell your doctor if you have any pain that does not go away or that gets worse. What side effects may I notice from receiving this medicine? Side effects that you should report to your doctor or health care professional as soon as possible: -allergic reactions like skin rash, itching or hives, swelling of the face, lips, or tongue -anxiety, confusion, or depression -breathing problems -changes in vision -feeling faint or lightheaded, falls -jaw burning, cramping, pain -muscle cramps, stiffness, or weakness -trouble passing urine or change in the amount of urine Side effects that usually do not require medical attention (report to your doctor or health care professional if they continue or are bothersome): -bone, joint, or muscle pain -fever -hair loss -irritation at site where injected -loss of appetite -nausea, vomiting -stomach upset -tired This list may not describe all possible side effects. Call your doctor for medical advice about side effects. You may report side effects to FDA at 1-800-FDA-1088. Where should I keep my medicine? This drug is given in a hospital or clinic and will not be stored at home. NOTE: This sheet is a summary. It may not cover all possible information. If you have questions about this medicine, talk to your doctor, pharmacist, or health care  provider.  2012, [employer]vier/[employer] Standard. (01/30/2011 9:06:58 AM)

## 2011-12-09 NOTE — Procedures (Signed)
R IJ port revision No complication No blood loss. See complete dictation in [facility].

## 2011-12-09 NOTE — Progress Notes (Signed)
This office note has been dictated.

## 2011-12-09 NOTE — H&P (Signed)
Referring physician: Dr. Myna Hidalgo Chief Complaint: Myeloma HPI: Arthur Valdez is an 78 y.o. male with a history of myeloma who has had prior port placement for treatment. However, he is now starting a new treatment and his existing port is not functioning. He is referred to IR for access of current port and/or placement/exchange for a new port.  Past Medical History:  Past Medical History  Diagnosis Date  . Multiple myeloma in relapse 07/16/2011  . Anemia of renal disease 07/16/2011  . Diabetes mellitus   . Hypertension     Past Surgical History:  Past Surgical History  Procedure Date  . Joint replacement     Family History: History reviewed. No pertinent family history.  Social History:  reports that he quit smoking about 10 years ago. He does not have any smokeless tobacco history on file. His alcohol and drug histories not on file.  Allergies: No Known Allergies  Medications: Zovirax, Norvasc, MVI, Kyprolis, Lortab, Motrin, Lantus, Januvia, Synthroid, Ativan, Prilosec, Compazine  Please HPI for pertinent positives, otherwise complete 10 system ROS negative.  Blood pressure 156/84, pulse 75, temperature 98.1 F (36.7 C), temperature source Oral, resp. rate 14, SpO2 96.00%. There is no height or weight on file to calculate BMI.   General Appearance:  Alert, cooperative, no distress, appears stated age  Head:  Normocephalic, without obvious abnormality, atraumatic  ENT: Unremarkable  Neck: Supple, symmetrical, trachea midline, no adenopathy, thyroid: not enlarged, symmetric, no tenderness/mass/nodules  Lungs:   Clear to auscultation bilaterally, no w/r/r, respirations unlabored without use of accessory muscles.  Chest Wall:  No tenderness or deformity. Small palpable port ant (R)chest, Nontender.  Heart:  Regular rate and rhythm, S1, S2 normal, no murmur, rub or gallop. Carotids 2+ without bruit.  Abdomen:   Soft, non-tender, non distended. Bowel sounds active all four  quadrants,  no masses, no organomegaly.  Neurologic: Normal affect, no gross deficits.   Results for orders placed during the hospital encounter of 12/09/11 (from the past 48 hour(s))  APTT     Status: Normal   Collection Time   12/09/11  7:52 AM      Component Value Range Comment   aPTT 34  24 - 37 (seconds)   BASIC METABOLIC PANEL     Status: Abnormal   Collection Time   12/09/11  7:52 AM      Component Value Range Comment   Sodium 139  135 - 145 (mEq/L)    Potassium 3.8  3.5 - 5.1 (mEq/L)    Chloride 106  96 - 112 (mEq/L)    CO2 21  19 - 32 (mEq/L)    Glucose, Bld 133 (*) 70 - 99 (mg/dL)    BUN 16  6 - 23 (mg/dL)    Creatinine, Ser 4.09 (*) 0.50 - 1.35 (mg/dL)    Calcium 9.0  8.4 - 10.5 (mg/dL)    GFR calc non Af Amer 45 (*) >90 (mL/min)    GFR calc Af Amer 52 (*) >90 (mL/min)   CBC     Status: Abnormal   Collection Time   12/09/11  7:52 AM      Component Value Range Comment   WBC 6.3  4.0 - 10.5 (K/uL)    RBC 3.13 (*) 4.22 - 5.81 (MIL/uL)    Hemoglobin 9.4 (*) 13.0 - 17.0 (g/dL)    HCT 81.1 (*) 91.4 - 52.0 (%)    MCV 92.7  78.0 - 100.0 (fL)    MCH 30.0  26.0 -  34.0 (pg)    MCHC 32.4  30.0 - 36.0 (g/dL)    RDW 69.6 (*) 29.5 - 15.5 (%)    Platelets 198  150 - 400 (K/uL)   PROTIME-INR     Status: Normal   Collection Time   12/09/11  7:52 AM      Component Value Range Comment   Prothrombin Time 13.3  11.6 - 15.2 (seconds)    INR 0.99  0.00 - 1.49     No results found.  Assessment/Plan Myeloma Nonfunctional port For port revision/exchange today Discussed procedure including risks with pt. Consent signed in chart.  Brayton El PA-C 12/09/2011, 8:47 AM

## 2011-12-10 ENCOUNTER — Encounter: Payer: Self-pay | Admitting: Hematology & Oncology

## 2011-12-10 ENCOUNTER — Ambulatory Visit: Payer: Medicare PPO

## 2011-12-10 NOTE — Progress Notes (Signed)
CC:   Arthur Valdez, M.D.  DIAGNOSES: 1. Recurrent lambda light chain myeloma. 2. Steroid-induced diabetes.  CURRENT THERAPY: 1. The patient is status post 3 cycles of Kyprolis. 2. Zometa 4 mg IV q. 3-4 weeks.  INTERIM HISTORY:  Arthur Valdez was in for another dose of Kyprolis. However, I wanted to see him because his myeloma panel has not looked good.  His last lambda free light chain was up to 189 mg/dL.  I think this is an indicator that the Kyprolis is not working.  As such, I really believe that we are going to have to change therapy on him.  The rest of his myeloma panel did not show any obvious monoclonal spike. His heavy chains were all on the low side.  His renal function has been holding relatively steady.  His blood sugars are a lot better.  Dr. Perrin Maltese has done a great job in managing his hyperglycemia.  I talked to Arthur Valdez at length about the situation.  I told him that we are going to have to change therapy on him.  I believe putting him on pomalidomide is a good idea.  This is an oral regimen.  Some recent studies have shown that adding Biaxin to the pomalidomide In addition to low-dose dexamethasone does improve response rates.  I think this would not be a bad idea for Arthur Valdez.  All of his vital signs look good.  his temperature is 98.1.  his blood pressure is 145/89.  Pulse is 64. His physical exam really was not changed from when I saw him last week. There is no swelling in his legs.  His lungs were clear.  Cardiac: Regular rate and rhythm with no murmurs, rubs or bruits.  I talked to Arthur Valdez at length about the change to pomalidomide.  He is in favor of this.  We will arrange for him to take the pomalidomide at 4 mg a day for 21 days on and 7 days off.  He will take clarithromycin 500 mg p.o. b.i.d.  He will take the Decadron 20 mg weekly for 3 weeks and on 1 week off.  He is going to need some thromboembolic prophylaxis.  I told him to take 2 baby  aspirin a day.  He is scheduled to come back to see me in 1 month.   ______________________________ [name], M.D. PRE/MEDQ  D:  12/09/2011  T:  12/10/2011  Job:  [id number]

## 2011-12-11 ENCOUNTER — Telehealth: Payer: Self-pay | Admitting: Hematology & Oncology

## 2011-12-11 ENCOUNTER — Other Ambulatory Visit: Payer: Self-pay | Admitting: *Deleted

## 2011-12-11 MED ORDER — POMALIDOMIDE 4 MG PO CAPS: 1.0000 | ORAL_CAPSULE | Freq: Every day | ORAL | Status: DC

## 2011-12-11 NOTE — Telephone Encounter (Signed)
Per RN cancelled 5-1 and 2 appointments

## 2011-12-16 ENCOUNTER — Ambulatory Visit: Payer: Medicare PPO

## 2011-12-16 ENCOUNTER — Other Ambulatory Visit: Payer: Medicare PPO | Admitting: Lab

## 2011-12-17 ENCOUNTER — Ambulatory Visit: Payer: Medicare PPO

## 2011-12-18 ENCOUNTER — Encounter: Payer: Self-pay | Admitting: Hematology & Oncology

## 2011-12-18 ENCOUNTER — Encounter: Payer: Self-pay | Admitting: *Deleted

## 2011-12-18 NOTE — Progress Notes (Signed)
Consent form signed by patient for Pomalidomide.

## 2011-12-29 ENCOUNTER — Telehealth: Payer: Self-pay | Admitting: Hematology & Oncology

## 2011-12-29 NOTE — Telephone Encounter (Signed)
Spoke to [employer] letting him know I received a faxed request from Patient Arthur Valdez asking for him to submit financial information. Gave him the number provided on the request form and he said he will call and follow up with the foundation.

## 2011-12-30 ENCOUNTER — Other Ambulatory Visit (HOSPITAL_BASED_OUTPATIENT_CLINIC_OR_DEPARTMENT_OTHER): Payer: Medicare PPO | Admitting: Lab

## 2011-12-30 ENCOUNTER — Ambulatory Visit (HOSPITAL_BASED_OUTPATIENT_CLINIC_OR_DEPARTMENT_OTHER): Payer: Medicare PPO

## 2011-12-30 ENCOUNTER — Ambulatory Visit (HOSPITAL_BASED_OUTPATIENT_CLINIC_OR_DEPARTMENT_OTHER): Payer: Medicare PPO | Admitting: Hematology & Oncology

## 2011-12-30 VITALS — BP 129/72 | HR 79 | Temp 97.9°F | Ht 71.0 in | Wt 226.0 lb

## 2011-12-30 DIAGNOSIS — C9002 Multiple myeloma in relapse: Secondary | ICD-10-CM

## 2011-12-30 DIAGNOSIS — C9000 Multiple myeloma not having achieved remission: Secondary | ICD-10-CM

## 2011-12-30 LAB — TECHNOLOGIST REVIEW CHCC SATELLITE

## 2011-12-30 LAB — CBC WITH DIFFERENTIAL (CANCER CENTER ONLY)
BASO#: 0 10*3/uL (ref 0.0–0.2)
Eosinophils Absolute: 0 10*3/uL (ref 0.0–0.5)
HGB: 8.9 g/dL — ABNORMAL LOW (ref 13.0–17.1)
LYMPH%: 7.6 % — ABNORMAL LOW (ref 14.0–48.0)
MCH: 30.7 pg (ref 28.0–33.4)
MCV: 92 fL (ref 82–98)
MONO%: 16.5 % — ABNORMAL HIGH (ref 0.0–13.0)
RBC: 2.9 10*6/uL — ABNORMAL LOW (ref 4.20–5.70)

## 2011-12-30 LAB — BASIC METABOLIC PANEL - CANCER CENTER ONLY
BUN, Bld: 33 mg/dL — ABNORMAL HIGH (ref 7–22)
Calcium: 8.4 mg/dL (ref 8.0–10.3)
Creat: 1.7 mg/dl — ABNORMAL HIGH (ref 0.6–1.2)
Glucose, Bld: 176 mg/dL — ABNORMAL HIGH (ref 73–118)

## 2011-12-30 MED ORDER — ZOLEDRONIC ACID 4 MG/5ML IV CONC
3.5000 mg | Freq: Once | INTRAVENOUS | Status: AC
  Administered 2011-12-30: 3.5 mg via INTRAVENOUS
  Filled 2011-12-30: qty 4.38

## 2011-12-30 MED ORDER — DEXTROSE 5 % IV SOLN: 60.0000 mg | Freq: Once | INTRAVENOUS | Status: DC

## 2011-12-30 MED ORDER — SODIUM CHLORIDE 0.9 % IV SOLN: Freq: Once | INTRAVENOUS | Status: DC

## 2011-12-30 MED ORDER — SODIUM CHLORIDE 0.9 % IJ SOLN
10.0000 mL | INTRAMUSCULAR | Status: DC | PRN
  Administered 2011-12-30: 10 mL via INTRAVENOUS
  Filled 2011-12-30: qty 10

## 2011-12-30 MED ORDER — ONDANSETRON 8 MG/50ML IVPB (CHCC): 8.0000 mg | Freq: Once | INTRAVENOUS | Status: DC

## 2011-12-30 MED ORDER — HEPARIN SOD (PORK) LOCK FLUSH 100 UNIT/ML IV SOLN
500.0000 [IU] | Freq: Once | INTRAVENOUS | Status: AC | PRN
  Administered 2011-12-30: 500 [IU] via INTRAVENOUS
  Filled 2011-12-30: qty 5

## 2011-12-30 NOTE — Patient Instructions (Signed)
Zoledronic Acid injection (Hypercalcemia, Oncology) What is this medicine? ZOLEDRONIC ACID (ZOE le dron ik AS id) lowers the amount of calcium loss from bone. It is used to treat too much calcium in your blood from cancer. It is also used to prevent complications of cancer that has spread to the bone. This medicine may be used for other purposes; ask your health care provider or pharmacist if you have questions. What should I tell my health care provider before I take this medicine? They need to know if you have any of these conditions: -aspirin-sensitive asthma -dental disease -kidney disease -an unusual or allergic reaction to zoledronic acid, other medicines, foods, dyes, or preservatives -pregnant or trying to get pregnant -breast-feeding How should I use this medicine? This medicine is for infusion into a vein. It is given by a health care professional in a hospital or clinic setting. Talk to your pediatrician regarding the use of this medicine in children. Special care may be needed. Overdosage: If you think you have taken too much of this medicine contact a poison control center or emergency room at once. NOTE: This medicine is only for you. Do not share this medicine with others. What if I miss a dose? It is important not to miss your dose. Call your doctor or health care professional if you are unable to keep an appointment. What may interact with this medicine? -certain antibiotics given by injection -NSAIDs, medicines for pain and inflammation, like ibuprofen or naproxen -some diuretics like bumetanide, furosemide -teriparatide -thalidomide This list may not describe all possible interactions. Give your health care provider a list of all the medicines, herbs, non-prescription drugs, or dietary supplements you use. Also tell them if you smoke, drink alcohol, or use illegal drugs. Some items may interact with your medicine. What should I watch for while using this medicine? Visit  your doctor or health care professional for regular checkups. It may be some time before you see the benefit from this medicine. Do not stop taking your medicine unless your doctor tells you to. Your doctor may order blood tests or other tests to see how you are doing. Women should inform their doctor if they wish to become pregnant or think they might be pregnant. There is a potential for serious side effects to an unborn child. Talk to your health care professional or pharmacist for more information. You should make sure that you get enough calcium and vitamin D while you are taking this medicine. Discuss the foods you eat and the vitamins you take with your health care professional. Some people who take this medicine have severe bone, joint, and/or muscle pain. This medicine may also increase your risk for a broken thigh bone. Tell your doctor right away if you have pain in your upper leg or groin. Tell your doctor if you have any pain that does not go away or that gets worse. What side effects may I notice from receiving this medicine? Side effects that you should report to your doctor or health care professional as soon as possible: -allergic reactions like skin rash, itching or hives, swelling of the face, lips, or tongue -anxiety, confusion, or depression -breathing problems -changes in vision -feeling faint or lightheaded, falls -jaw burning, cramping, pain -muscle cramps, stiffness, or weakness -trouble passing urine or change in the amount of urine Side effects that usually do not require medical attention (report to your doctor or health care professional if they continue or are bothersome): -bone, joint, or muscle pain -  fever -hair loss -irritation at site where injected -loss of appetite -nausea, vomiting -stomach upset -tired This list may not describe all possible side effects. Call your doctor for medical advice about side effects. You may report side effects to FDA at  1-800-FDA-1088. Where should I keep my medicine? This drug is given in a hospital or clinic and will not be stored at home. NOTE: This sheet is a summary. It may not cover all possible information. If you have questions about this medicine, talk to your doctor, pharmacist, or health care provider.  2012, [employer]. (01/30/2011 9:06:58 AM) 

## 2011-12-31 ENCOUNTER — Ambulatory Visit: Payer: Medicare PPO

## 2011-12-31 LAB — KAPPA/LAMBDA LIGHT CHAINS: Kappa free light chain: 0.81 mg/dL (ref 0.33–1.94)

## 2012-01-06 ENCOUNTER — Ambulatory Visit: Payer: Medicare PPO

## 2012-01-06 ENCOUNTER — Other Ambulatory Visit (HOSPITAL_BASED_OUTPATIENT_CLINIC_OR_DEPARTMENT_OTHER): Payer: Medicare PPO | Admitting: Lab

## 2012-01-06 DIAGNOSIS — C9000 Multiple myeloma not having achieved remission: Secondary | ICD-10-CM

## 2012-01-06 LAB — CBC WITH DIFFERENTIAL (CANCER CENTER ONLY)
BASO#: 0 10*3/uL (ref 0.0–0.2)
Eosinophils Absolute: 0.1 10*3/uL (ref 0.0–0.5)
HCT: 26.8 % — ABNORMAL LOW (ref 38.7–49.9)
HGB: 8.7 g/dL — ABNORMAL LOW (ref 13.0–17.1)
LYMPH#: 0.6 10*3/uL — ABNORMAL LOW (ref 0.9–3.3)
MCHC: 32.5 g/dL (ref 32.0–35.9)
NEUT#: 0.7 10*3/uL — ABNORMAL LOW (ref 1.5–6.5)
NEUT%: 32.6 % — ABNORMAL LOW (ref 40.0–80.0)
RBC: 2.91 10*6/uL — ABNORMAL LOW (ref 4.20–5.70)

## 2012-01-06 LAB — BASIC METABOLIC PANEL WITH GFR
BUN: 22 mg/dL (ref 6–23)
CO2: 23 meq/L (ref 19–32)
Calcium: 8.8 mg/dL (ref 8.4–10.5)
Chloride: 107 meq/L (ref 96–112)
Creatinine, Ser: 1.73 mg/dL — ABNORMAL HIGH (ref 0.50–1.35)
Glucose, Bld: 128 mg/dL — ABNORMAL HIGH (ref 70–99)
Potassium: 4.5 meq/L (ref 3.5–5.3)
Sodium: 138 meq/L (ref 135–145)

## 2012-01-07 ENCOUNTER — Ambulatory Visit: Payer: Medicare PPO

## 2012-01-08 ENCOUNTER — Other Ambulatory Visit: Payer: Self-pay | Admitting: *Deleted

## 2012-01-08 MED ORDER — POMALIDOMIDE 4 MG PO CAPS: 1.0000 | ORAL_CAPSULE | Freq: Every day | ORAL | Status: DC

## 2012-01-08 NOTE — Telephone Encounter (Signed)
Received refill request from [facility] for Pomalyst 4 mg. Obtained auth # [id number]. Will fax back to to [phone] when signed by Dr Myna Hidalgo.

## 2012-01-12 ENCOUNTER — Other Ambulatory Visit: Payer: Self-pay | Admitting: Hematology & Oncology

## 2012-01-12 DIAGNOSIS — B370 Candidal stomatitis: Secondary | ICD-10-CM

## 2012-01-12 MED ORDER — FLUCONAZOLE 100 MG PO TABS: ORAL_TABLET | ORAL | Status: DC

## 2012-01-13 ENCOUNTER — Other Ambulatory Visit (HOSPITAL_BASED_OUTPATIENT_CLINIC_OR_DEPARTMENT_OTHER): Payer: Medicare PPO | Admitting: Lab

## 2012-01-13 ENCOUNTER — Ambulatory Visit: Payer: Medicare PPO

## 2012-01-13 DIAGNOSIS — C9000 Multiple myeloma not having achieved remission: Secondary | ICD-10-CM

## 2012-01-13 LAB — BASIC METABOLIC PANEL
CO2: 23 mEq/L (ref 19–32)
Calcium: 8.4 mg/dL (ref 8.4–10.5)
Chloride: 107 mEq/L (ref 96–112)
Glucose, Bld: 152 mg/dL — ABNORMAL HIGH (ref 70–99)
Potassium: 4.4 mEq/L (ref 3.5–5.3)
Sodium: 139 mEq/L (ref 135–145)

## 2012-01-13 LAB — CBC WITH DIFFERENTIAL (CANCER CENTER ONLY)
BASO%: 1.7 % (ref 0.0–2.0)
Eosinophils Absolute: 0 10*3/uL (ref 0.0–0.5)
LYMPH#: 0.6 10*3/uL — ABNORMAL LOW (ref 0.9–3.3)
MONO#: 0.5 10*3/uL (ref 0.1–0.9)
NEUT#: 1.8 10*3/uL (ref 1.5–6.5)
Platelets: 363 10*3/uL (ref 145–400)
RBC: 3 10*6/uL — ABNORMAL LOW (ref 4.20–5.70)
RDW: 16.6 % — ABNORMAL HIGH (ref 11.1–15.7)
WBC: 3 10*3/uL — ABNORMAL LOW (ref 4.0–10.0)

## 2012-01-14 ENCOUNTER — Other Ambulatory Visit: Payer: Self-pay | Admitting: *Deleted

## 2012-01-14 ENCOUNTER — Ambulatory Visit: Payer: Medicare PPO

## 2012-01-14 ENCOUNTER — Encounter: Payer: Self-pay | Admitting: *Deleted

## 2012-01-14 MED ORDER — POMALIDOMIDE 3 MG PO CAPS: 1.0000 | ORAL_CAPSULE | Freq: Every day | ORAL | Status: DC

## 2012-01-14 NOTE — Progress Notes (Signed)
Per Dr. Myna Hidalgo decrease Pomalyst to 3mg /day.  Rx sent to [facility] pharmacy.

## 2012-01-14 NOTE — Telephone Encounter (Signed)
To decrease Pomalyst to 3 mg/day per Dr Myna Hidalgo. To fax to [facility] at [phone] after signed.

## 2012-01-29 ENCOUNTER — Other Ambulatory Visit: Payer: Self-pay | Admitting: Hematology & Oncology

## 2012-01-29 DIAGNOSIS — C9002 Multiple myeloma in relapse: Secondary | ICD-10-CM

## 2012-02-08 ENCOUNTER — Other Ambulatory Visit: Payer: Self-pay | Admitting: *Deleted

## 2012-02-08 MED ORDER — POMALIDOMIDE 3 MG PO CAPS: 3.0000 mg | ORAL_CAPSULE | Freq: Every day | ORAL | Status: DC

## 2012-02-08 NOTE — Telephone Encounter (Signed)
Received refill request for Pomalyst 3 mg from [facility]. Obtained auth # [id number] from [employer]. Faxed back to [phone].

## 2012-02-12 ENCOUNTER — Ambulatory Visit (HOSPITAL_BASED_OUTPATIENT_CLINIC_OR_DEPARTMENT_OTHER): Payer: Medicare PPO | Admitting: Hematology & Oncology

## 2012-02-12 ENCOUNTER — Other Ambulatory Visit (HOSPITAL_BASED_OUTPATIENT_CLINIC_OR_DEPARTMENT_OTHER): Payer: Medicare PPO | Admitting: Lab

## 2012-02-12 ENCOUNTER — Ambulatory Visit: Payer: Medicare PPO

## 2012-02-12 VITALS — BP 112/71 | HR 94 | Temp 96.7°F | Ht 70.0 in | Wt 221.0 lb

## 2012-02-12 DIAGNOSIS — E119 Type 2 diabetes mellitus without complications: Secondary | ICD-10-CM

## 2012-02-12 DIAGNOSIS — C9002 Multiple myeloma in relapse: Secondary | ICD-10-CM

## 2012-02-12 DIAGNOSIS — D631 Anemia in chronic kidney disease: Secondary | ICD-10-CM

## 2012-02-12 DIAGNOSIS — C9000 Multiple myeloma not having achieved remission: Secondary | ICD-10-CM

## 2012-02-12 DIAGNOSIS — J45901 Unspecified asthma with (acute) exacerbation: Secondary | ICD-10-CM

## 2012-02-12 LAB — CBC WITH DIFFERENTIAL (CANCER CENTER ONLY)
BASO#: 0.1 10*3/uL (ref 0.0–0.2)
BASO%: 1.3 % (ref 0.0–2.0)
EOS%: 11.7 % — ABNORMAL HIGH (ref 0.0–7.0)
HCT: 28.1 % — ABNORMAL LOW (ref 38.7–49.9)
HGB: 9.1 g/dL — ABNORMAL LOW (ref 13.0–17.1)
LYMPH%: 28.4 % (ref 14.0–48.0)
MCH: 29.2 pg (ref 28.0–33.4)
MCHC: 32.4 g/dL (ref 32.0–35.9)
MCV: 90 fL (ref 82–98)
NEUT%: 40.1 % (ref 40.0–80.0)
RDW: 17.5 % — ABNORMAL HIGH (ref 11.1–15.7)

## 2012-02-12 MED ORDER — SODIUM CHLORIDE 0.9 % IJ SOLN
10.0000 mL | INTRAMUSCULAR | Status: DC | PRN
  Administered 2012-02-12: 10 mL via INTRAVENOUS
  Filled 2012-02-12: qty 10

## 2012-02-12 MED ORDER — HEPARIN SOD (PORK) LOCK FLUSH 100 UNIT/ML IV SOLN
500.0000 [IU] | Freq: Once | INTRAVENOUS | Status: AC
  Administered 2012-02-12: 500 [IU] via INTRAVENOUS
  Filled 2012-02-12: qty 5

## 2012-02-12 MED ORDER — ALBUTEROL SULFATE HFA 108 (90 BASE) MCG/ACT IN AERS: 2.0000 | INHALATION_SPRAY | RESPIRATORY_TRACT | Status: DC | PRN

## 2012-02-12 NOTE — Patient Instructions (Signed)

## 2012-02-12 NOTE — Progress Notes (Signed)
This office note has been dictated.

## 2012-02-12 NOTE — Progress Notes (Signed)
CC:   [name], M.D.  DIAGNOSES: 1. Recurrent lambda light chain myeloma. 2. Steroid-induced diabetes. 3. Renal insufficiency, likely multifactorial.  CURRENT THERAPY: 1. Patient status post I think 2 cycles of on     pomalidomide/Biaxin/Decadron. 2. Zometa, on hold secondary to renal insufficiency.  INTERIM HISTORY:  Mr. Rodkey comes in for followup.  He is managing okay. He is watching his blood sugars.  His family doctor, Dr. Perrin Maltese, is helping [place] with this.  His last myeloma panel showed a lambda light chain of 183 mg/dL.  He has had no rashes.  He has had no pruritus.  He has had no mouth sores.  He has had no bony pain.  He does get short of breath with mild exertion.  PHYSICAL EXAMINATION:  This is a well-developed, well-nourished black gentleman in no obvious distress.  Vital signs:  Temperature of 96.7, pulse 94, respiratory rate 16, blood pressure 112/71.  Weight is 221. Head and neck:  No ocular or oral lesions.  He has no thrush.  Lungs: Clear.  Cardiac:  Regular rate and rhythm with no murmurs, rubs or bruits.  Abdomen:  Soft with good bowel sounds.  There is no fluid wave. There is no palpable hepatosplenomegaly.  Extremities:  No clubbing, cyanosis or edema.  Neurologic:  No focal neurological deficits.  LABORATORY STUDIES:  White cell count 3.8, hemoglobin 9.1, hematocrit 28.1, platelet count 318.  IMPRESSION:  Mr. Swayne is a 78 year old gentleman with recurrent lambda light chain myeloma.  He really did not respond to Kyprolis.  We have him on literally last-line therapy with pomalidomide/Decadron.  We will check his myeloma studies.  We will hold his Zometa for now.  Hopefully, with his hemoglobin getting better he will get less dyspneic with exertion.  I will see him back in another month.  He had his Port-A-Cath flushed today.    ______________________________ [name], M.D. PRE/MEDQ  D:  02/12/2012  T:  02/12/2012  Job:  [id number]

## 2012-02-12 NOTE — Addendum Note (Signed)
Addended by: Arlan Organ R on: 02/12/2012 09:54 AM   Modules accepted: Orders

## 2012-02-15 ENCOUNTER — Telehealth: Payer: Self-pay | Admitting: *Deleted

## 2012-02-16 ENCOUNTER — Telehealth: Payer: Self-pay | Admitting: Hematology & Oncology

## 2012-02-16 LAB — COMPREHENSIVE METABOLIC PANEL
ALT: 10 U/L (ref 0–53)
AST: 10 U/L (ref 0–37)
BUN: 21 mg/dL (ref 6–23)
CO2: 24 mEq/L (ref 19–32)
Calcium: 8.5 mg/dL (ref 8.4–10.5)
Creatinine, Ser: 1.49 mg/dL — ABNORMAL HIGH (ref 0.50–1.35)
Total Bilirubin: 0.5 mg/dL (ref 0.3–1.2)

## 2012-02-16 LAB — PROTEIN ELECTROPHORESIS, SERUM, WITH REFLEX
Alpha-1-Globulin: 8.9 % — ABNORMAL HIGH (ref 2.9–4.9)
Alpha-2-Globulin: 15.2 % — ABNORMAL HIGH (ref 7.1–11.8)
Beta 2: 4.7 % (ref 3.2–6.5)
Beta Globulin: 7.2 % (ref 4.7–7.2)
Gamma Globulin: 8.7 % — ABNORMAL LOW (ref 11.1–18.8)

## 2012-02-16 LAB — IGG, IGA, IGM
IgA: 99 mg/dL (ref 68–379)
IgG (Immunoglobin G), Serum: 543 mg/dL — ABNORMAL LOW (ref 650–1600)

## 2012-02-16 LAB — IFE INTERPRETATION

## 2012-02-16 NOTE — Telephone Encounter (Addendum)
Message copied by Cathi Roan on Tue Feb 16, 2012  9:37 AM ------      Message from: [name]      Created: Sun Feb 14, 2012  8:15 PM       Myeloma is getting better!!  Good job!!!  Cindee Lame  02-16-12  9:30 am,  Called patient on home phone and left message regarding above MD message, also left our phone # to all [place] back if any questions.  Alonna Minium LPN

## 2012-03-03 ENCOUNTER — Other Ambulatory Visit: Payer: Self-pay | Admitting: *Deleted

## 2012-03-03 MED ORDER — LORAZEPAM 2 MG PO TABS: 2.0000 mg | ORAL_TABLET | Freq: Two times a day (BID) | ORAL | Status: DC | PRN

## 2012-03-08 ENCOUNTER — Other Ambulatory Visit: Payer: Self-pay | Admitting: *Deleted

## 2012-03-08 DIAGNOSIS — C9002 Multiple myeloma in relapse: Secondary | ICD-10-CM

## 2012-03-08 MED ORDER — POMALIDOMIDE 3 MG PO CAPS
3.0000 mg | ORAL_CAPSULE | Freq: Every day | ORAL | Status: DC
Start: 2012-03-08 — End: 2012-07-11

## 2012-03-08 NOTE — Progress Notes (Signed)
This office note has been dictated.

## 2012-03-14 ENCOUNTER — Ambulatory Visit: Payer: Medicare PPO

## 2012-03-14 ENCOUNTER — Ambulatory Visit (HOSPITAL_BASED_OUTPATIENT_CLINIC_OR_DEPARTMENT_OTHER): Payer: Medicare PPO | Admitting: Hematology & Oncology

## 2012-03-14 ENCOUNTER — Other Ambulatory Visit (HOSPITAL_BASED_OUTPATIENT_CLINIC_OR_DEPARTMENT_OTHER): Payer: Medicare PPO | Admitting: Lab

## 2012-03-14 VITALS — BP 119/72 | HR 75 | Temp 97.5°F | Ht 70.0 in | Wt 222.0 lb

## 2012-03-14 DIAGNOSIS — D631 Anemia in chronic kidney disease: Secondary | ICD-10-CM

## 2012-03-14 DIAGNOSIS — C9002 Multiple myeloma in relapse: Secondary | ICD-10-CM

## 2012-03-14 DIAGNOSIS — C9000 Multiple myeloma not having achieved remission: Secondary | ICD-10-CM

## 2012-03-14 DIAGNOSIS — E119 Type 2 diabetes mellitus without complications: Secondary | ICD-10-CM

## 2012-03-14 DIAGNOSIS — D649 Anemia, unspecified: Secondary | ICD-10-CM

## 2012-03-14 LAB — CBC WITH DIFFERENTIAL (CANCER CENTER ONLY)
BASO#: 0 10*3/uL (ref 0.0–0.2)
Eosinophils Absolute: 0.3 10*3/uL (ref 0.0–0.5)
HGB: 9 g/dL — ABNORMAL LOW (ref 13.0–17.1)
LYMPH#: 0.7 10*3/uL — ABNORMAL LOW (ref 0.9–3.3)
MCH: 29 pg (ref 28.0–33.4)
MONO#: 0.6 10*3/uL (ref 0.1–0.9)
NEUT#: 1.2 10*3/uL — ABNORMAL LOW (ref 1.5–6.5)
RBC: 3.1 10*6/uL — ABNORMAL LOW (ref 4.20–5.70)
WBC: 2.8 10*3/uL — ABNORMAL LOW (ref 4.0–10.0)

## 2012-03-14 LAB — CMP (CANCER CENTER ONLY)
ALT(SGPT): 12 U/L (ref 10–47)
AST: 13 U/L (ref 11–38)
BUN, Bld: 22 mg/dL (ref 7–22)
Creat: 1.6 mg/dl — ABNORMAL HIGH (ref 0.6–1.2)
Total Bilirubin: 0.8 mg/dl (ref 0.20–1.60)

## 2012-03-14 MED ORDER — HEPARIN SOD (PORK) LOCK FLUSH 100 UNIT/ML IV SOLN
500.0000 [IU] | Freq: Once | INTRAVENOUS | Status: AC | PRN
  Administered 2012-03-14: 500 [IU] via INTRAVENOUS
  Filled 2012-03-14: qty 5

## 2012-03-14 MED ORDER — SODIUM CHLORIDE 0.9 % IJ SOLN
10.0000 mL | INTRAMUSCULAR | Status: DC | PRN
  Administered 2012-03-14: 10 mL via INTRAVENOUS
  Filled 2012-03-14: qty 10

## 2012-03-14 NOTE — Progress Notes (Signed)
Altamese Dilling presented for Portacath access and flush. Proper placement of portacath confirmed by CXR. Portacath located in the right chest wall accessed with  H 20 needle. Clean, Dry and Intact Good blood return present. Portacath flushed with 20ml NS and 500U/29ml Heparin per protocol and needle removed intact. Procedure without incident. Patient tolerated procedure well.

## 2012-03-14 NOTE — Progress Notes (Signed)
This office note has been dictated.

## 2012-03-15 LAB — KAPPA/LAMBDA LIGHT CHAINS
Kappa free light chain: 2.17 mg/dL — ABNORMAL HIGH (ref 0.33–1.94)
Kappa:Lambda Ratio: 0.01 — ABNORMAL LOW (ref 0.26–1.65)
Lambda Free Lght Chn: 162 mg/dL — ABNORMAL HIGH (ref 0.57–2.63)

## 2012-03-15 LAB — IGG, IGA, IGM
IgA: 90 mg/dL (ref 68–379)
IgG (Immunoglobin G), Serum: 505 mg/dL — ABNORMAL LOW (ref 650–1600)
IgM, Serum: 25 mg/dL — ABNORMAL LOW (ref 41–251)

## 2012-03-15 NOTE — Progress Notes (Signed)
CC:   [name], M.D.  DIAGNOSES: 1. Recurrent lambda light chain myeloma. 2. Chronic renal insufficiency. 3. Steroid-induced diabetes.  CURRENT THERAPY: 1. Patient status post 3 cycles of pomalidomide/Biaxin/Decadron. 2. Zometa, on hold secondary to renal insufficiency.  INTERIM HISTORY:  Arthur Valdez comes in for his followup.  He is doing a little bit better.  He is tired from the pomalidomide.  He just finished his 3rd cycle.  I told him to take 2 weeks off instead of 1 and see if that not did make him feel a little bit better.  His last lambda light chain did show a response.  His lambda light chain was 154 mg/dL.  Prior it was 183 mg/dL.  Hopefully, we will see a continued response.  He has not had any kind of rashes.  He has had no diarrhea.  He has had occasional headache.  He does get fatigued.  He does have some degree of anemia.  We have not yet had to give him blood or start him on any kind of ESA program.  PHYSICAL EXAMINATION:  This is a well-developed, well-nourished black gentleman in no obvious distress.  Vital signs:  Temperature of 97.5, pulse 75, respiratory rate 20, blood pressure is 119/72.  Weight is 222. Head and neck:  Normocephalic, atraumatic skull.  There are no ocular or oral lesions.  There are no palpable cervical or supraclavicular lymph nodes.  Lungs:  Clear bilaterally.  Cardiac:  Regular rate and rhythm with a normal S1 and S2.  There are no murmurs, rubs or bruits. Abdomen:  Soft with good bowel sounds.  There is no palpable abdominal mass.  There is no fluid wave.  There is no palpable hepatosplenomegaly. Extremities:  No clubbing, cyanosis or edema.  He may have some slight hypersensitivity to touch in his lower legs and feet.  Skin:  No rashes, ecchymosis or petechia.  LABORATORY STUDIES:  White cell count 2.8, hemoglobin 9, hematocrit 28.5, platelet count 195.  His sodium is 141, potassium 5, BUN 22, creatinine 1.6.  Calcium is 9.6  with an albumin of 3.2.  IMPRESSION:  Arthur Valdez is a 78 year old African gentleman with recurrent lambda light chain myeloma.  I have him on pomalidomide/Biaxin/Decadron. He is hopefully responding to this.  Again, we will give him a 2-week break on the pomalidomide at this time.  I will plan to see him back in 4 or 5 weeks.  His pomalidomide dose is 3 mg daily (21 days on and 7 days off).  Again, we will have to see how his response is.  Hopefully, we will continue to see his lambda light chain improve.  We will continue to hold this Zometa until I feel more confident with his renal function.    ______________________________ Arthur Valdez, M.D. PRE/MEDQ  D:  03/14/2012  T:  03/15/2012  Job:  [id number]

## 2012-04-07 ENCOUNTER — Other Ambulatory Visit: Payer: Self-pay | Admitting: Hematology & Oncology

## 2012-04-19 ENCOUNTER — Other Ambulatory Visit (HOSPITAL_BASED_OUTPATIENT_CLINIC_OR_DEPARTMENT_OTHER): Payer: Medicare PPO | Admitting: Lab

## 2012-04-19 ENCOUNTER — Ambulatory Visit (HOSPITAL_BASED_OUTPATIENT_CLINIC_OR_DEPARTMENT_OTHER): Payer: Medicare PPO | Admitting: Hematology & Oncology

## 2012-04-19 ENCOUNTER — Ambulatory Visit: Payer: Medicare PPO

## 2012-04-19 VITALS — BP 109/57 | HR 83 | Temp 98.4°F | Resp 20 | Ht 71.0 in | Wt 219.0 lb

## 2012-04-19 DIAGNOSIS — C9002 Multiple myeloma in relapse: Secondary | ICD-10-CM

## 2012-04-19 DIAGNOSIS — D638 Anemia in other chronic diseases classified elsewhere: Secondary | ICD-10-CM

## 2012-04-19 DIAGNOSIS — N189 Chronic kidney disease, unspecified: Secondary | ICD-10-CM

## 2012-04-19 LAB — CBC WITH DIFFERENTIAL (CANCER CENTER ONLY)
BASO%: 1.6 % (ref 0.0–2.0)
EOS%: 5.8 % (ref 0.0–7.0)
LYMPH%: 22.6 % (ref 14.0–48.0)
MCHC: 31.8 g/dL — ABNORMAL LOW (ref 32.0–35.9)
MCV: 92 fL (ref 82–98)
MONO#: 0.7 10*3/uL (ref 0.1–0.9)
Platelets: 229 10*3/uL (ref 145–400)
RDW: 17 % — ABNORMAL HIGH (ref 11.1–15.7)
WBC: 1.9 10*3/uL — ABNORMAL LOW (ref 4.0–10.0)

## 2012-04-19 MED ORDER — ALTEPLASE 2 MG IJ SOLR
2.0000 mg | Freq: Once | INTRAMUSCULAR | Status: DC | PRN
  Filled 2012-04-19: qty 2

## 2012-04-19 MED ORDER — SODIUM CHLORIDE 0.9 % IJ SOLN
10.0000 mL | INTRAMUSCULAR | Status: DC | PRN
  Administered 2012-04-19: 10 mL via INTRAVENOUS
  Filled 2012-04-19: qty 10

## 2012-04-19 MED ORDER — HEPARIN SOD (PORK) LOCK FLUSH 100 UNIT/ML IV SOLN
500.0000 [IU] | Freq: Once | INTRAVENOUS | Status: AC | PRN
  Administered 2012-04-19: 500 [IU] via INTRAVENOUS
  Filled 2012-04-19: qty 5

## 2012-04-19 NOTE — Patient Instructions (Signed)
Stop Pomalyst right now\.  I will call with results [facility] blood sugars.

## 2012-04-19 NOTE — Progress Notes (Signed)
CC:   Arthur Valdez, M.D.  DIAGNOSES: 1. Recurrent lambda light chain myeloma. 2. Chronic renal insufficiency. 3. 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 4. Steroid induced hyperglycemia.  CURRENT THERAPY: 1. The patient on status post I think 4 cycles of     pomalidomide/Biaxin/Decadron. 2. Zometa, on hold secondary to renal insufficiency.  INTERIM HISTORY:  Arthur Valdez comes in for followup.  The pomalidomide has still taken a toll on him.  He is down to 3 mg a day.  He said he just feels very tired.  He does not have a lot of energy.  When we last checked his myeloma studies, in late July, his lambda light chain was stable at 162 mg/dL.  There was no obvious monoclonal spike in his serum.  His heavy chains were all low. His last renal function showed a BUN of 22 and a creatinine of 1.6.  His Zometa has been on hold.  Again, he just does not have a lot of energy.  He wonders if he should continue on the pomalidomide.  He has had no bleeding.  He has had no diarrhea.  He has had no mouth sores.  PHYSICAL EXAMINATION:  General:  This is a well-developed, well- nourished black gentleman in no obvious distress.  Vital signs:  98.4, pulse 83, respiratory rate 20, blood pressure 109/57.  Weight is 219. Head and neck:  Normocephalic, atraumatic skull.  There are no ocular or oral lesions.  There are no palpable cervical or supraclavicular lymph nodes.  Lungs:  Clear bilaterally.  Cardiac:  Regular rate and rhythm with a normal S1, S2.  There are no murmurs, rubs or bruits.  Abdomen: Soft with good bowel sounds.  There is no palpable abdominal mass. There is no fluid wave.  There is no palpable hepatosplenomegaly.  Back: No tenderness over the spine, ribs or hips.  Extremities:  Shows no clubbing, cyanosis or edema.  LABORATORIES STUDIES:  White cell count is 1.9, hemoglobin 9.7, hematocrit 30.5, platelet count 229.  IMPRESSION:  Arthur Valdez is a 78 year old gentleman with  recurrent lambda light chain myeloma.  He is on pomalidomide now.  He is not tolerating it too well.  We will have to see what the light chains show now.  I think if we find an increase in his light chains I would just stop the pomalidomide.  If we find out that his light chains are improving, maybe we could cut the pomalidomide dose down to 2 mg.  Arthur Valdez, is all about quality of life.  He wants to pursue a good quality of life.  I can understand this very well.  Again, we will hold on the Zometa.  I will plan to get him back in another month.  Hopefully, his renal function will be improving.  It is certainly possible that his renal function is being compromised by the light chains.    ______________________________ [name], M.D. PRE/MEDQ  D:  04/19/2012  T:  04/19/2012  Job:  [id number]

## 2012-04-19 NOTE — Patient Instructions (Signed)

## 2012-04-19 NOTE — Progress Notes (Signed)
This office note has been dictated.

## 2012-04-20 ENCOUNTER — Other Ambulatory Visit: Payer: Self-pay | Admitting: *Deleted

## 2012-04-20 DIAGNOSIS — C9002 Multiple myeloma in relapse: Secondary | ICD-10-CM

## 2012-04-20 LAB — COMPREHENSIVE METABOLIC PANEL
ALT: 8 U/L (ref 0–53)
AST: 10 U/L (ref 0–37)
Alkaline Phosphatase: 47 U/L (ref 39–117)
CO2: 25 mEq/L (ref 19–32)
Sodium: 138 mEq/L (ref 135–145)
Total Bilirubin: 0.5 mg/dL (ref 0.3–1.2)
Total Protein: 6 g/dL (ref 6.0–8.3)

## 2012-04-20 LAB — IGG, IGA, IGM
IgA: 131 mg/dL (ref 68–379)
IgG (Immunoglobin G), Serum: 669 mg/dL (ref 650–1600)
IgM, Serum: 28 mg/dL — ABNORMAL LOW (ref 41–251)

## 2012-04-20 LAB — KAPPA/LAMBDA LIGHT CHAINS: Kappa free light chain: 1.89 mg/dL (ref 0.33–1.94)

## 2012-04-20 MED ORDER — CLOTRIMAZOLE 10 MG MT TROC: 10.0000 mg | Freq: Three times a day (TID) | OROMUCOSAL | Status: DC | PRN

## 2012-05-13 ENCOUNTER — Other Ambulatory Visit: Payer: Self-pay | Admitting: Hematology & Oncology

## 2012-05-13 ENCOUNTER — Telehealth: Payer: Self-pay | Admitting: Hematology & Oncology

## 2012-05-13 ENCOUNTER — Encounter: Payer: Self-pay | Admitting: Oncology

## 2012-05-13 DIAGNOSIS — C9002 Multiple myeloma in relapse: Secondary | ICD-10-CM

## 2012-05-13 NOTE — Progress Notes (Signed)
Victorino Dike from [facility] calls requesting whether we received faxes regarding Arthur Valdez.  She asks that we get Mr. Hoon scheduled for Zometa d/t Calcium of 11.  Dr. Myna Hidalgo notified, patient has been scheduled for Monday 05/16/12.  To my knowledge we have not received the faxes. Mr. Salo will also start chemo regime here on Monday. Teola Bradley, Decklan Mau [employer]

## 2012-05-13 NOTE — Telephone Encounter (Signed)
Left messages on both numbers with Monday 1245 pm appointment and to call for details

## 2012-05-13 NOTE — Progress Notes (Signed)
DIAGNOSIS:  Recurrent lambda light chain myeloma.  CURRENT THERAPY: 1. The patient is status post 3 cycles of Kyprolis. 2. Zometa 4 mg IV every 3-4 weeks.  INTERIM HISTORY:  Arthur Valdez comes in for followup.  I think we have held his Kyprolis.  He was not responding to this.  As such, I felt that we needed to make a change.  I think that going to pomalidomide may not be a bad idea.  This, I think would be reasonable.  I think he could tolerate pomalidomide.  His renal function has been holding steady.  We have been, I think, holding the Zometa because of his renal insufficiency.  He has had no bony pain.  He has had no cough.  He has had no change in bowel or bladder habits.  PHYSICAL EXAMINATION:  General:  This is a well-developed, well- nourished black gentleman in no obvious distress.  Vital Signs:  Show a temperature of 97.9, pulse 79, respiratory rate 18, blood pressure 129/72, weight is 226.  Head and Neck Exam:  Shows no ocular or oral lesions.  There are no palpable cervical or supraclavicular lymph nodes. Lungs:  Clear bilaterally.  Cardiac Exam:  Regular rate and rhythm with no murmurs, rubs, or bruits.  Abdominal Exam:  Soft with good bowel sounds.  There is no fluid wave.  No palpable hepatosplenomegaly is noted.  Extremities:  Show no clubbing, cyanosis, or edema.  Skin Exam: No rashes.  LABORATORY STUDIES:  White cell count is 7.2, hemoglobin 8.9, hematocrit 26.7, platelet count 199.  Lambda light chain is 183 mg/dL.  BUN is 22, creatinine 1.7.  IMPRESSION:  Arthur Valdez is a 78 year old black gentleman with recurrent lambda light chain myeloma.  He has a good performance status.  He was on Kyprolis.  He had a transient response to this, but now is progressing.  We will go ahead and make a switch over to pomalidomide.  I think he could tolerate pomalidomide.  He had a bad time with carfilzomib because of a rash.  We will give some Biaxin with the pomalidomide.   We will also add Decadron.  We will continue to follow his labs monthly.  We will see him back in 1 month.    ______________________________ Arthur Valdez, M.D. PRE/MEDQ  D:  05/13/2012  T:  05/13/2012  Job:  [id number]

## 2012-05-16 ENCOUNTER — Other Ambulatory Visit (HOSPITAL_BASED_OUTPATIENT_CLINIC_OR_DEPARTMENT_OTHER): Payer: Medicare PPO | Admitting: Lab

## 2012-05-16 ENCOUNTER — Other Ambulatory Visit: Payer: Self-pay | Admitting: *Deleted

## 2012-05-16 ENCOUNTER — Ambulatory Visit: Payer: Medicare PPO

## 2012-05-16 ENCOUNTER — Encounter: Payer: Self-pay | Admitting: *Deleted

## 2012-05-16 ENCOUNTER — Other Ambulatory Visit: Payer: Medicare PPO

## 2012-05-16 ENCOUNTER — Ambulatory Visit (HOSPITAL_BASED_OUTPATIENT_CLINIC_OR_DEPARTMENT_OTHER): Payer: Medicare PPO | Admitting: Hematology & Oncology

## 2012-05-16 ENCOUNTER — Ambulatory Visit (HOSPITAL_BASED_OUTPATIENT_CLINIC_OR_DEPARTMENT_OTHER): Payer: Medicare PPO

## 2012-05-16 VITALS — BP 122/71 | HR 83 | Temp 97.7°F | Resp 20 | Ht 72.0 in | Wt 228.0 lb

## 2012-05-16 DIAGNOSIS — D630 Anemia in neoplastic disease: Secondary | ICD-10-CM

## 2012-05-16 DIAGNOSIS — C9002 Multiple myeloma in relapse: Secondary | ICD-10-CM

## 2012-05-16 DIAGNOSIS — Z5112 Encounter for antineoplastic immunotherapy: Secondary | ICD-10-CM

## 2012-05-16 DIAGNOSIS — N289 Disorder of kidney and ureter, unspecified: Secondary | ICD-10-CM

## 2012-05-16 DIAGNOSIS — Z5111 Encounter for antineoplastic chemotherapy: Secondary | ICD-10-CM

## 2012-05-16 LAB — CBC WITH DIFFERENTIAL (CANCER CENTER ONLY)
BASO#: 0 10*3/uL (ref 0.0–0.2)
BASO%: 0.2 % (ref 0.0–2.0)
EOS%: 3.2 % (ref 0.0–7.0)
LYMPH#: 1.1 10*3/uL (ref 0.9–3.3)
MCH: 29.9 pg (ref 28.0–33.4)
MCHC: 32.7 g/dL (ref 32.0–35.9)
MONO%: 11.6 % (ref 0.0–13.0)
NEUT#: 3.4 10*3/uL (ref 1.5–6.5)
Platelets: 256 10*3/uL (ref 145–400)
RDW: 16.8 % — ABNORMAL HIGH (ref 11.1–15.7)

## 2012-05-16 LAB — CMP (CANCER CENTER ONLY)
AST: 10 U/L — ABNORMAL LOW (ref 11–38)
BUN, Bld: 15 mg/dL (ref 7–22)
Calcium: 8.9 mg/dL (ref 8.0–10.3)
Chloride: 104 mEq/L (ref 98–108)
Creat: 1.5 mg/dl — ABNORMAL HIGH (ref 0.6–1.2)
Total Bilirubin: 0.6 mg/dl (ref 0.20–1.60)

## 2012-05-16 MED ORDER — SODIUM CHLORIDE 0.9 % IV SOLN
300.0000 mg/m2 | Freq: Once | INTRAVENOUS | Status: AC
  Administered 2012-05-16: 660 mg via INTRAVENOUS
  Filled 2012-05-16: qty 33

## 2012-05-16 MED ORDER — SODIUM CHLORIDE 0.9 % IJ SOLN
10.0000 mL | INTRAMUSCULAR | Status: DC | PRN
  Administered 2012-05-16: 10 mL
  Filled 2012-05-16: qty 10

## 2012-05-16 MED ORDER — DEXAMETHASONE SODIUM PHOSPHATE 10 MG/ML IJ SOLN
10.0000 mg | Freq: Once | INTRAMUSCULAR | Status: AC
  Administered 2012-05-16: 10 mg via INTRAVENOUS

## 2012-05-16 MED ORDER — SODIUM CHLORIDE 0.9 % IV SOLN: Freq: Once | INTRAVENOUS | Status: DC

## 2012-05-16 MED ORDER — ZOLEDRONIC ACID 4 MG/5ML IV CONC
3.5000 mg | Freq: Once | INTRAVENOUS | Status: AC
  Administered 2012-05-16: 3.5 mg via INTRAVENOUS
  Filled 2012-05-16: qty 4.38

## 2012-05-16 MED ORDER — ONDANSETRON 8 MG/50ML IVPB (CHCC)
8.0000 mg | Freq: Once | INTRAVENOUS | Status: AC
  Administered 2012-05-16: 8 mg via INTRAVENOUS

## 2012-05-16 MED ORDER — CARFILZOMIB CHEMO INJECTION 60 MG
20.0000 mg/m2 | Freq: Once | INTRAVENOUS | Status: AC
  Administered 2012-05-16: 44 mg via INTRAVENOUS
  Filled 2012-05-16: qty 22

## 2012-05-16 MED ORDER — ACYCLOVIR 400 MG PO TABS: 400.0000 mg | ORAL_TABLET | Freq: Every day | ORAL | Status: DC

## 2012-05-16 MED ORDER — HEPARIN SOD (PORK) LOCK FLUSH 100 UNIT/ML IV SOLN
500.0000 [IU] | Freq: Once | INTRAVENOUS | Status: AC | PRN
  Administered 2012-05-16: 500 [IU]
  Filled 2012-05-16: qty 5

## 2012-05-16 MED ORDER — PROMETHAZINE HCL 25 MG PO TABS: 12.5000 mg | ORAL_TABLET | Freq: Four times a day (QID) | ORAL | Status: DC | PRN

## 2012-05-16 MED ORDER — ONDANSETRON HCL 8 MG PO TABS: 8.0000 mg | ORAL_TABLET | Freq: Two times a day (BID) | ORAL | Status: DC

## 2012-05-16 MED ORDER — SODIUM CHLORIDE 0.9 % IV SOLN
Freq: Once | INTRAVENOUS | Status: AC
  Administered 2012-05-16: 11:00:00 via INTRAVENOUS

## 2012-05-16 NOTE — Progress Notes (Signed)
This office note has been dictated.

## 2012-05-16 NOTE — Patient Instructions (Addendum)
Take home medications: For Nausea: Take Zofran 8 mg once in the am and once in the pm beginning Wednesday 05/18/12 for 3 days total to help prevent nausea.  You will be getting Zofran through the IV on Tuesday 05/17/12.  But you can still take Phenergan for nausea if you need it.   If you are still experiencing nausea take Phenergan 12.5 mg every 6 hours as needed for nausea.   Also sent to your pharmacy is Acyclovir 400 mg to be taken once daily.  This is to help prevent you from getting an infection.Carfilzomib injection    What is this medicine? CARFILZOMIB is a chemotherapy drug that works by slowing or stopping cancer cell growth. This medicine is used to treat multiple myeloma. This medicine may be used for other purposes; ask your health care provider or pharmacist if you have questions. What should I tell my health care provider before I take this medicine? They need to know if you have any of these conditions: -heart disease -irregular heartbeat -liver disease -lung or breathing disease -an unusual or allergic reaction to carfilzomib, or other medicines, foods, dyes, or preservatives -pregnant or trying to get pregnant -breast-feeding How should I use this medicine? This medicine is for injection or infusion into a vein. It is given by a health care professional in a hospital or clinic setting. Talk to your pediatrician regarding the use of this medicine in children. Special care may be needed. Overdosage: If you think you've taken too much of this medicine contact a poison control center or emergency room at once. Overdosage: If you think you have taken too much of this medicine contact a poison control center or emergency room at once. NOTE: This medicine is only for you. Do not share this medicine with others. What if I miss a dose? It is important not to miss your dose. Call your doctor or health care professional if you are unable to keep an appointment. What may interact with  this medicine? Interactions are not expected. Give your health care provider a list of all the medicines, herbs, non-prescription drugs, or dietary supplements you use. Also tell them if you smoke, drink alcohol, or use illegal drugs. Some items may interact with your medicine. This list may not describe all possible interactions. Give your health care provider a list of all the medicines, herbs, non-prescription drugs, or dietary supplements you use. Also tell them if you smoke, drink alcohol, or use illegal drugs. Some items may interact with your medicine. What should I watch for while using this medicine? Your condition will be monitored carefully while you are receiving this medicine. Report any side effects. Continue your course of treatment even though you feel ill unless your doctor tells you to stop. Call your doctor or health care professional for advice if you get a fever, chills or sore throat, or other symptoms of a cold or flu. Do not treat yourself. Try to avoid being around people who are sick. Do not become pregnant while taking this medicine. Women should inform their doctor if they wish to become pregnant or think they might be pregnant. There is a potential for serious side effects to an unborn child. Talk to your health care professional or pharmacist for more information. Do not breast-feed an infant while taking this medicine. Check with your doctor or health care professional if you get an attack of severe diarrhea, nausea and vomiting, or if you sweat a lot. The loss of too much  body fluid can make it dangerous for you to take this medicine. You may get dizzy. Do not drive, use machinery, or do anything that needs mental alertness until you know how this medicine affects you. Do not stand or sit up quickly, especially if you are an older patient. This reduces the risk of dizzy or fainting spells. What side effects may I notice from receiving this medicine? Side effects that you  should report to your doctor or health care professional as soon as possible: -allergic reactions like skin rash, itching or hives, swelling of the face, lips, or tongue -breathing problems -chest pain or palpitationschest tightness -cough -dark urine -dizziness -feeling faint or lightheaded -fever or chills -general ill feeling or flu-like symptoms -light-colored stools -palpitations -right upper belly pain -swelling of the legs or ankles -unusual bleeding or bruising -unusually weak or tired -yellowing of the eyes or skin  Side effects that usually do not require medical attention (Report these to your doctor or health care professional if they continue or are bothersome.): -diarrhea -headache -nausea, vomiting -tiredness This list may not describe all possible side effects. Call your doctor for medical advice about side effects. You may report side effects to FDA at 1-800-FDA-1088. Where should I keep my medicine? This drug is given in a hospital or clinic and will not be stored at home. NOTE: This sheet is a summary. It may not cover all possible information. If you have questions about this medicine, talk to your doctor, pharmacist, or health care provider.  2012, Elsevier/Gold Standard. (03/12/2011 4:35:08 PM)Cyclophosphamide injection What is this medicine? CYCLOPHOSPHAMIDE (sye kloe FOSS fa mide) is a chemotherapy drug. It slows the growth of cancer cells. This medicine is used to treat many types of cancer like lymphoma, myeloma, leukemia, breast cancer, and ovarian cancer, to name a few. It is also used to treat nephrotic syndrome in children. This medicine may be used for other purposes; ask your health care provider or pharmacist if you have questions. What should I tell my health care provider before I take this medicine? They need to know if you have any of these conditions: -blood disorders -history of other chemotherapy -history of radiation  therapy -infection -kidney disease -liver disease -tumors in the bone marrow -an unusual or allergic reaction to cyclophosphamide, other chemotherapy, other medicines, foods, dyes, or preservatives -pregnant or trying to get pregnant -breast-feeding How should I use this medicine? This drug is usually given as an injection into a vein or muscle or by infusion into a vein. It is administered in a hospital or clinic by a specially trained health care professional. Talk to your pediatrician regarding the use of this medicine in children. While this drug may be prescribed for selected conditions, precautions do apply. Overdosage: If you think you have taken too much of this medicine contact a poison control center or emergency room at once. NOTE: This medicine is only for you. Do not share this medicine with others. What if I miss a dose? It is important not to miss your dose. Call your doctor or health care professional if you are unable to keep an appointment. What may interact with this medicine? Do not take this medicine with any of the following medications: -mibefradil -nalidixic acid This medicine may also interact with the following medications: -doxorubicin -etanercept -medicines to increase blood counts like filgrastim, pegfilgrastim, sargramostim -medicines that block muscle or nerve pain -St. John's Wort -phenobarbital -succinylcholine chloride -trastuzumab -vaccines Talk to your doctor or health care  professional before taking any of these medicines: -acetaminophen -aspirin -ibuprofen -ketoprofen -naproxen This list may not describe all possible interactions. Give your health care provider a list of all the medicines, herbs, non-prescription drugs, or dietary supplements you use. Also tell them if you smoke, drink alcohol, or use illegal drugs. Some items may interact with your medicine. What should I watch for while using this medicine? Visit your doctor for checks on  your progress. This drug may make you feel generally unwell. This is not uncommon, as chemotherapy can affect healthy cells as well as cancer cells. Report any side effects. Continue your course of treatment even though you feel ill unless your doctor tells you to stop. Drink water or other fluids as directed. Urinate often, even at night. In some cases, you may be given additional medicines to help with side effects. Follow all directions for their use. Call your doctor or health care professional for advice if you get a fever, chills or sore throat, or other symptoms of a cold or flu. Do not treat yourself. This drug decreases your body's ability to fight infections. Try to avoid being around people who are sick. This medicine may increase your risk to bruise or bleed. Call your doctor or health care professional if you notice any unusual bleeding. Be careful brushing and flossing your teeth or using a toothpick because you may get an infection or bleed more easily. If you have any dental work done, tell your dentist you are receiving this medicine. Avoid taking products that contain aspirin, acetaminophen, ibuprofen, naproxen, or ketoprofen unless instructed by your doctor. These medicines may hide a fever. Do not become pregnant while taking this medicine. Women should inform their doctor if they wish to become pregnant or think they might be pregnant. There is a potential for serious side effects to an unborn child. Talk to your health care professional or pharmacist for more information. Do not breast-feed an infant while taking this medicine. Men should inform their doctor if they wish to father a child. This medicine may lower sperm counts. If you are going to have surgery, tell your doctor or health care professional that you have taken this medicine. What side effects may I notice from receiving this medicine? Side effects that you should report to your doctor or health care professional as soon  as possible: -allergic reactions like skin rash, itching or hives, swelling of the face, lips, or tongue -low blood counts - this medicine may decrease the number of white blood cells, red blood cells and platelets. You may be at increased risk for infections and bleeding. -signs of infection - fever or chills, cough, sore throat, pain or difficulty passing urine -signs of decreased platelets or bleeding - bruising, pinpoint red spots on the skin, black, tarry stools, blood in the urine -signs of decreased red blood cells - unusually weak or tired, fainting spells, lightheadedness -breathing problems -dark urine -mouth sores -pain, swelling, redness at site where injected -swelling of the ankles, feet, hands -trouble passing urine or change in the amount of urine -weight gain -yellowing of the eyes or skin Side effects that usually do not require medical attention (report to your doctor or health care professional if they continue or are bothersome): -changes in nail or skin color -diarrhea -hair loss -loss of appetite -missed menstrual periods -nausea, vomiting -stomach pain This list may not describe all possible side effects. Call your doctor for medical advice about side effects. You may report side effects  to FDA at 1-800-FDA-1088. Where should I keep my medicine? This drug is given in a hospital or clinic and will not be stored at home. NOTE: This sheet is a summary. It may not cover all possible information. If you have questions about this medicine, talk to your doctor, pharmacist, or health care provider.  2012, Elsevier/Gold Standard. (11/08/2007 2:32:25 PM)

## 2012-05-17 ENCOUNTER — Ambulatory Visit: Payer: Medicare PPO | Admitting: Hematology & Oncology

## 2012-05-17 ENCOUNTER — Other Ambulatory Visit: Payer: Medicare PPO | Admitting: Lab

## 2012-05-17 ENCOUNTER — Ambulatory Visit (HOSPITAL_BASED_OUTPATIENT_CLINIC_OR_DEPARTMENT_OTHER): Payer: Medicare PPO

## 2012-05-17 VITALS — BP 122/69 | HR 91 | Temp 97.0°F | Resp 18

## 2012-05-17 DIAGNOSIS — C9002 Multiple myeloma in relapse: Secondary | ICD-10-CM

## 2012-05-17 DIAGNOSIS — Z5112 Encounter for antineoplastic immunotherapy: Secondary | ICD-10-CM

## 2012-05-17 DIAGNOSIS — C9000 Multiple myeloma not having achieved remission: Secondary | ICD-10-CM

## 2012-05-17 LAB — KAPPA/LAMBDA LIGHT CHAINS
Kappa:Lambda Ratio: 0 — ABNORMAL LOW (ref 0.26–1.65)
Lambda Free Lght Chn: 262 mg/dL — ABNORMAL HIGH (ref 0.57–2.63)

## 2012-05-17 MED ORDER — ONDANSETRON 8 MG/50ML IVPB (CHCC)
8.0000 mg | Freq: Once | INTRAVENOUS | Status: AC
  Administered 2012-05-17: 8 mg via INTRAVENOUS

## 2012-05-17 MED ORDER — DEXAMETHASONE SODIUM PHOSPHATE 10 MG/ML IJ SOLN
10.0000 mg | Freq: Once | INTRAMUSCULAR | Status: AC
  Administered 2012-05-17: 10 mg via INTRAVENOUS

## 2012-05-17 MED ORDER — SODIUM CHLORIDE 0.9 % IV SOLN
Freq: Once | INTRAVENOUS | Status: AC
  Administered 2012-05-17: 10:00:00 via INTRAVENOUS

## 2012-05-17 MED ORDER — HEPARIN SOD (PORK) LOCK FLUSH 100 UNIT/ML IV SOLN
500.0000 [IU] | Freq: Once | INTRAVENOUS | Status: AC | PRN
  Administered 2012-05-17: 500 [IU]
  Filled 2012-05-17: qty 5

## 2012-05-17 MED ORDER — DEXTROSE 5 % IV SOLN
20.0000 mg/m2 | Freq: Once | INTRAVENOUS | Status: AC
  Administered 2012-05-17: 44 mg via INTRAVENOUS
  Filled 2012-05-17: qty 22

## 2012-05-17 MED ORDER — SODIUM CHLORIDE 0.9 % IJ SOLN
10.0000 mL | INTRAMUSCULAR | Status: DC | PRN
  Administered 2012-05-17: 10 mL
  Filled 2012-05-17: qty 10

## 2012-05-17 NOTE — Patient Instructions (Signed)
Carfilzomib injection What is this medicine? CARFILZOMIB is a chemotherapy drug that works by slowing or stopping cancer cell growth. This medicine is used to treat multiple myeloma. This medicine may be used for other purposes; ask your health care provider or pharmacist if you have questions. What should I tell my health care provider before I take this medicine? They need to know if you have any of these conditions: -heart disease -irregular heartbeat -liver disease -lung or breathing disease -an unusual or allergic reaction to carfilzomib, or other medicines, foods, dyes, or preservatives -pregnant or trying to get pregnant -breast-feeding How should I use this medicine? This medicine is for injection or infusion into a vein. It is given by a health care professional in a hospital or clinic setting. Talk to your pediatrician regarding the use of this medicine in children. Special care may be needed. Overdosage: If you think you've taken too much of this medicine contact a poison control center or emergency room at once. Overdosage: If you think you have taken too much of this medicine contact a poison control center or emergency room at once. NOTE: This medicine is only for you. Do not share this medicine with others. What if I miss a dose? It is important not to miss your dose. Call your doctor or health care professional if you are unable to keep an appointment. What may interact with this medicine? Interactions are not expected. Give your health care provider a list of all the medicines, herbs, non-prescription drugs, or dietary supplements you use. Also tell them if you smoke, drink alcohol, or use illegal drugs. Some items may interact with your medicine. This list may not describe all possible interactions. Give your health care provider a list of all the medicines, herbs, non-prescription drugs, or dietary supplements you use. Also tell them if you smoke, drink alcohol, or use  illegal drugs. Some items may interact with your medicine. What should I watch for while using this medicine? Your condition will be monitored carefully while you are receiving this medicine. Report any side effects. Continue your course of treatment even though you feel ill unless your doctor tells you to stop. Call your doctor or health care professional for advice if you get a fever, chills or sore throat, or other symptoms of a cold or flu. Do not treat yourself. Try to avoid being around people who are sick. Do not become pregnant while taking this medicine. Women should inform their doctor if they wish to become pregnant or think they might be pregnant. There is a potential for serious side effects to an unborn child. Talk to your health care professional or pharmacist for more information. Do not breast-feed an infant while taking this medicine. Check with your doctor or health care professional if you get an attack of severe diarrhea, nausea and vomiting, or if you sweat a lot. The loss of too much body fluid can make it dangerous for you to take this medicine. You may get dizzy. Do not drive, use machinery, or do anything that needs mental alertness until you know how this medicine affects you. Do not stand or sit up quickly, especially if you are an older patient. This reduces the risk of dizzy or fainting spells. What side effects may I notice from receiving this medicine? Side effects that you should report to your doctor or health care professional as soon as possible: -allergic reactions like skin rash, itching or hives, swelling of the face, lips, or tongue -breathing   problems -chest pain or palpitationschest tightness -cough -dark urine -dizziness -feeling faint or lightheaded -fever or chills -general ill feeling or flu-like symptoms -light-colored stools -palpitations -right upper belly pain -swelling of the legs or ankles -unusual bleeding or bruising -unusually weak or  tired -yellowing of the eyes or skin  Side effects that usually do not require medical attention (Report these to your doctor or health care professional if they continue or are bothersome.): -diarrhea -headache -nausea, vomiting -tiredness This list may not describe all possible side effects. Call your doctor for medical advice about side effects. You may report side effects to FDA at 1-800-FDA-1088. Where should I keep my medicine? This drug is given in a hospital or clinic and will not be stored at home. NOTE: This sheet is a summary. It may not cover all possible information. If you have questions about this medicine, talk to your doctor, pharmacist, or health care provider.  2012, Elsevier/Gold Standard. (03/12/2011 4:35:08 PM) 

## 2012-05-17 NOTE — Progress Notes (Signed)
CC:   Arthur Valdez, M.D. Raye Sorrow, MD  DIAGNOSES: 1. Recurrent lambda light chain myeloma. 2. Renal insufficiency. 3. 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  CURRENT THERAPY: 1. Patient to receive first cycle Kyprolis/Cytoxan/Decadron. 2. Zometa, I think 3.3 mg IV q. month.  INTERIM HISTORY:  Arthur Valdez comes in for a visit.  He saw Dr. Marissa Calamity at [facility]  Dr. Marissa Calamity does have a clinical trial for Arthur Valdez. However, it is going to take a few weeks before this opens up.  When Dr. Marissa Calamity checked Arthur Valdez lab work, found that he had some hypercalcemia.  As such, he called me and we got him in here today.  Dr. Marissa Calamity feels that it would be a good idea to try to get him on some treatment until the protocol opens up.  I certainly have no problems with this.  Arthur Valdez has been feeling okay.  His last lambda light chain was 260 mg/dL.  He, unfortunately, did not respond at all to pomalidomide and had some toxicity with pomalidomide.  He has had a good appetite since he has been off pomalidomide.  He feels better since being off pomalidomide.  His weight has gone back up.  He has had no diarrhea.  He has had no rashes.  There has been no leg swelling.  PHYSICAL EXAMINATION:  This is a well-developed, well-nourished black gentleman in no obvious distress.  Vital signs:  97.7, pulse 83, respiratory rate 20, blood pressure 122/71.  Weight is 228.  Head and neck:  Normocephalic, atraumatic skull.  There are no ocular or oral lesions.  There are no palpable cervical or supraclavicular lymph nodes. Lungs:  Clear bilaterally.  Cardiac:  Regular rate and rhythm with a normal S1 and S2.  There are no murmurs, rubs or bruits.  Abdomen:  Soft with good bowel sounds.  There is no palpable abdominal mass.  There is no palpable hepatosplenomegaly.  Back:  No tenderness over the spine, ribs, or hips.  Extremities:  No clubbing, cyanosis or edema. Neurologic:  No  focal neurological deficits.  LABORATORY STUDIES:  White cell count is 5.3, hemoglobin 10.1, hematocrit 30.9, platelet count 256.  BUN is 15, creatinine 1.5.  Calcium 8.9 with an albumin of 3.4.  IMPRESSION:  Arthur Valdez is a 78 year old gentleman with recurrent lambda light chain myeloma.  He actually looks very well.  He has done incredibly nicely overall.  We will go ahead and get him on treatment now.  Hopefully, we will see a response.  I think that we will see a response.  We have to be careful with his dosages given his renal insufficiency.  We will plan to get him back to see [place] in another week or so just so that we can maintain close followup with him.    ______________________________ [name], M.D. PRE/MEDQ  D:  05/16/2012  T:  05/17/2012  Job:  [id number]

## 2012-05-23 ENCOUNTER — Other Ambulatory Visit (HOSPITAL_BASED_OUTPATIENT_CLINIC_OR_DEPARTMENT_OTHER): Payer: Medicare PPO | Admitting: Lab

## 2012-05-23 ENCOUNTER — Ambulatory Visit: Payer: Medicare PPO

## 2012-05-23 ENCOUNTER — Ambulatory Visit (HOSPITAL_BASED_OUTPATIENT_CLINIC_OR_DEPARTMENT_OTHER): Payer: Medicare PPO

## 2012-05-23 ENCOUNTER — Other Ambulatory Visit: Payer: Medicare PPO | Admitting: Lab

## 2012-05-23 ENCOUNTER — Ambulatory Visit (HOSPITAL_BASED_OUTPATIENT_CLINIC_OR_DEPARTMENT_OTHER): Payer: Medicare PPO | Admitting: Medical

## 2012-05-23 VITALS — BP 118/64 | HR 92 | Temp 97.3°F | Resp 18 | Ht 72.0 in | Wt 226.0 lb

## 2012-05-23 DIAGNOSIS — G4700 Insomnia, unspecified: Secondary | ICD-10-CM

## 2012-05-23 DIAGNOSIS — C9002 Multiple myeloma in relapse: Secondary | ICD-10-CM

## 2012-05-23 DIAGNOSIS — Z452 Encounter for adjustment and management of vascular access device: Secondary | ICD-10-CM

## 2012-05-23 DIAGNOSIS — Z5111 Encounter for antineoplastic chemotherapy: Secondary | ICD-10-CM

## 2012-05-23 DIAGNOSIS — R52 Pain, unspecified: Secondary | ICD-10-CM

## 2012-05-23 LAB — CMP (CANCER CENTER ONLY)
ALT(SGPT): 13 U/L (ref 10–47)
CO2: 28 mEq/L (ref 18–33)
Potassium: 4.3 mEq/L (ref 3.3–4.7)
Sodium: 144 mEq/L (ref 128–145)
Total Bilirubin: 0.6 mg/dl (ref 0.20–1.60)
Total Protein: 6.8 g/dL (ref 6.4–8.1)

## 2012-05-23 LAB — CBC WITH DIFFERENTIAL (CANCER CENTER ONLY)
BASO%: 0.2 % (ref 0.0–2.0)
LYMPH#: 0.9 10*3/uL (ref 0.9–3.3)
LYMPH%: 15.5 % (ref 14.0–48.0)
MCV: 91 fL (ref 82–98)
MONO#: 0.5 10*3/uL (ref 0.1–0.9)
NEUT#: 4.1 10*3/uL (ref 1.5–6.5)
Platelets: 191 10*3/uL (ref 145–400)
RDW: 16.4 % — ABNORMAL HIGH (ref 11.1–15.7)
WBC: 5.7 10*3/uL (ref 4.0–10.0)

## 2012-05-23 MED ORDER — HYDROCODONE-ACETAMINOPHEN 5-500 MG PO TABS: 1.0000 | ORAL_TABLET | Freq: Four times a day (QID) | ORAL | Status: DC | PRN

## 2012-05-23 MED ORDER — TEMAZEPAM 15 MG PO CAPS: 15.0000 mg | ORAL_CAPSULE | Freq: Every evening | ORAL | Status: DC | PRN

## 2012-05-23 MED ORDER — SODIUM CHLORIDE 0.9 % IV SOLN
Freq: Once | INTRAVENOUS | Status: AC
  Administered 2012-05-23: 12:00:00 via INTRAVENOUS

## 2012-05-23 MED ORDER — ONDANSETRON 8 MG/50ML IVPB (CHCC)
8.0000 mg | Freq: Once | INTRAVENOUS | Status: AC
  Administered 2012-05-23: 8 mg via INTRAVENOUS

## 2012-05-23 MED ORDER — DEXAMETHASONE SODIUM PHOSPHATE 10 MG/ML IJ SOLN: 10.0000 mg | Freq: Once | INTRAMUSCULAR | Status: DC

## 2012-05-23 MED ORDER — DEXTROSE 5 % IV SOLN
20.0000 mg/m2 | Freq: Once | INTRAVENOUS | Status: AC
  Administered 2012-05-23: 44 mg via INTRAVENOUS
  Filled 2012-05-23: qty 22

## 2012-05-23 MED ORDER — HEPARIN SOD (PORK) LOCK FLUSH 100 UNIT/ML IV SOLN
500.0000 [IU] | Freq: Once | INTRAVENOUS | Status: AC | PRN
  Administered 2012-05-23: 500 [IU]
  Filled 2012-05-23: qty 5

## 2012-05-23 MED ORDER — SODIUM CHLORIDE 0.9 % IJ SOLN
10.0000 mL | INTRAMUSCULAR | Status: DC | PRN
  Administered 2012-05-23: 10 mL
  Filled 2012-05-23: qty 10

## 2012-05-23 MED ORDER — SODIUM CHLORIDE 0.9 % IV SOLN
300.0000 mg/m2 | Freq: Once | INTRAVENOUS | Status: AC
  Administered 2012-05-23: 660 mg via INTRAVENOUS
  Filled 2012-05-23: qty 33

## 2012-05-23 MED ORDER — ALTEPLASE 2 MG IJ SOLR
2.0000 mg | Freq: Once | INTRAMUSCULAR | Status: AC | PRN
  Administered 2012-05-23: 2 mg
  Filled 2012-05-23: qty 2

## 2012-05-23 MED ORDER — DEXAMETHASONE SODIUM PHOSPHATE 10 MG/ML IJ SOLN
10.0000 mg | Freq: Once | INTRAMUSCULAR | Status: AC
  Administered 2012-05-23: 10 mg via INTRAVENOUS

## 2012-05-23 NOTE — Progress Notes (Signed)
Diagnoses: #1 recurrent, lambda light chain myeloma. #2 renal insufficiency. #3 anemia, secondary to myeloma./Renal insufficiency.  Current therapy: #1.   The patient is due for his cycle 1 day 8 of Kyprolis/Cytoxan/Decadron #2 Zometa 3.5 mg IV monthly.  Last given on 05/16/2012.  Interim history: Mr. Lynch presents today for an office followup visit.  His wife accompanies him.  Overall he, reports, that he is tolerating his chemotherapy relatively well.  He, also receive Zometa 3.5 mg IV monthly.  Mr. Force, reports, that he did have one episode of some extreme low back pain a few days after his last treatment.  He reports, that he did take tramadol, however, that really did not subside the pain.  He, also reports having some trouble sleeping at night.  He does have Ativan however, he, reports, that that really does not help.  He, otherwise, reports, that he has a decent appetite.  He denies any nausea, vomiting, diarrhea, constipation, any chest pain, shortness of breath, or cough.  He denies any fevers, chills, or night sweats.  He denies any dysphasia or odynophagia.  He denies any headaches, visual changes, or any rashes.  He denies any type of lower leg swelling.  His last lambda light chain.  Back on 05/16/2012 was 262.  Unfortunately, Mr. Hang, really did not respond to the pomalidomide.  He, also had toxicity with the pamalidomide as well.  Mr. Gros is supposed to followup with Dr. Marissa Calamity , down at [facility] for clinical trial.  In the next few weeks.  Review of Systems: Pt. Denies any changes in their vision, hearing, adenopathy, fevers, chills, nausea, vomiting, diarrhea, constipation, chest pain, shortness of breath, passing blood, passing out, blacking out,  any changes in skin, joints, neurologic or psychiatric except as noted.  Physical Exam: This is a pleasant, well-developed, well-nourished, 78 year old, African American, gentleman, in no obvious distress Vitals: Temperature  97.3 degrees, pulse 92, respirations 18, blood pressure 118/64.  Weight 226 pounds HEENT reveals a normocephalic, atraumatic skull, no scleral icterus, no oral lesions  Neck is supple without any cervical or supraclavicular adenopathy.  Lungs are clear to auscultation bilaterally. There are no wheezes, rales or rhonci Cardiac is regular rate and rhythm with a normal S1 and S2. There are no murmurs, rubs, or bruits.  Abdomen is soft with good bowel sounds, there is no palpable mass. There is no palpable hepatosplenomegaly. There is no palpable fluid wave.  Musculoskeletal no tenderness of the spine, ribs, or hips.  Extremities there are no clubbing, cyanosis, or edema.  Skin no petechia, purpura or ecchymosis Neurologic is nonfocal.  Laboratory Data:  White count 5.7, hemoglobin 9.9, hematocrit 30.3, platelets 191,000  Current Outpatient Prescriptions on File Prior to Visit  Medication Sig Dispense Refill  . acyclovir (ZOVIRAX) 400 MG tablet Take 1 tablet (400 mg total) by mouth daily.  30 tablet  3  . albuterol (PROVENTIL HFA) 108 (90 BASE) MCG/ACT inhaler Inhale 2 puffs into the lungs every 4 (four) hours as needed.  2 Inhaler  6  . amLODipine (NORVASC) 10 MG tablet Take 10 mg by mouth daily before breakfast.       . B Complex-C (B-COMPLEX WITH VITAMIN C) tablet Take 1 tablet by mouth daily.      . clotrimazole (MYCELEX) 10 MG troche Take 1 tablet (10 mg total) by mouth 3 (three) times daily as needed. Mouth sore  90 tablet  0  . dexamethasone (DECADRON) 4 MG tablet Take 5 tablets once  a week; 3 weeks on 1 week off; Repeat with each cycle of Pomalyst  45 tablet  3  . fidaxomicin (DIFICID) 200 MG TABS tablet Take 200 mg by mouth 2 (two) times daily. Take 200mg  twice a day for 10 days      . ibuprofen (ADVIL,MOTRIN) 200 MG tablet Take 400 mg by mouth every 8 (eight) hours as needed. Pain      . insulin glargine (LANTUS) 100 UNIT/ML injection Inject 5 Units into the skin at bedtime. Pt only  take if bs > 125      . levothyroxine (SYNTHROID, LEVOTHROID) 50 MCG tablet Take 50 mcg by mouth daily before breakfast.      . LORazepam (ATIVAN) 2 MG tablet Take 1 tablet (2 mg total) by mouth every 12 (twelve) hours as needed. Nausea  30 tablet  3  . omeprazole (PRILOSEC) 20 MG capsule Take 20 mg by mouth daily.      . ondansetron (ZOFRAN) 8 MG tablet Take 1 tablet (8 mg total) by mouth 2 (two) times daily. Take 1 tablet by mouth (8 mg ) in the am and one tablet by mouth in the pm beginning day 3 or Wednesday, 05/18/12 for 3 days total.  20 tablet  3  . pomalidomide (POMALYST) 3 MG capsule Take 1 capsule (3 mg total) by mouth daily. 21 days on 7 days off. Auth # [id number]  21 capsule  0  . promethazine (PHENERGAN) 25 MG tablet Take 0.5 tablets (12.5 mg total) by mouth every 6 (six) hours as needed for nausea.  30 tablet  0  . sitaGLIPtin (JANUVIA) 100 MG tablet Take 100 mg by mouth daily before breakfast.      . traMADol (ULTRAM) 50 MG tablet TAKE 1 TO 2 TABLETS EVERY 6 TO 8 HOURS AS NEEDED FOR PAIN  120 tablet  0  . clarithromycin (BIAXIN) 500 MG tablet Take 1 tablet (500 mg total) by mouth 2 (two) times daily.  60 tablet  5  . DISCONTD: prochlorperazine (COMPAZINE) 10 MG tablet Take 1 tablet (10 mg total) by mouth every 6 (six) hours as needed (Nausea or vomiting).  30 tablet  1   Current Facility-Administered Medications on File Prior to Visit  Medication Dose Route Frequency Provider Last Rate Last Dose  . sodium chloride 0.9 % injection 10 mL  10 mL Intravenous PRN [name], MD   10 mL at 07/16/11 1002    Assessment/Plan: this is a pleasant, 78 year old, African American, gentleman, with the following issues:  #1 recurrent, lambda light chain myeloma.  So far, he has done well on Kyprolis/Cytoxan/Decadron.  He will go ahead with his cycle 8 day 1 today.  He, also continues to receive IV Zometa 3.5 mg monthly.  His last dose of Zometa was on 05/16/2012.  Again, Mr. Pollitt should be  enrolled in a clinical trial.  Down at [facility] with Dr. Marissa Calamity here in the next few weeks.  We will continue to keep a close eye on Mr. Buresh.  #2 intermittent pain-I. did get Mr. Cheever a prescription for Vicodin 5/500 mg to take every 6 hours as needed.  #3 insomnia-I. gave.  Mr. Asche a prescription for Restoril 15 mg to take before bedtime.  #4.  Followup-we will follow back up with Mr. Rebstock in one week, but before then should there be questions or concerns.

## 2012-05-23 NOTE — Patient Instructions (Signed)
Take home medications: For Nausea: Take Zofran 8 mg once in the am and once in the pm beginning Wednesday 05/18/12 for 3 days total to help prevent nausea.  You will be getting Zofran through the IV on Tuesday 05/17/12.  But you can still take Phenergan for nausea if you need it.   If you are still experiencing nausea take Phenergan 12.5 mg every 6 hours as needed for nausea.   Also sent to your pharmacy is Acyclovir 400 mg to be taken once daily.  This is to help prevent you from getting an infection.Carfilzomib injection    What is this medicine? CARFILZOMIB is a chemotherapy drug that works by slowing or stopping cancer cell growth. This medicine is used to treat multiple myeloma. This medicine may be used for other purposes; ask your health care provider or pharmacist if you have questions. What should I tell my health care provider before I take this medicine? They need to know if you have any of these conditions: -heart disease -irregular heartbeat -liver disease -lung or breathing disease -an unusual or allergic reaction to carfilzomib, or other medicines, foods, dyes, or preservatives -pregnant or trying to get pregnant -breast-feeding How should I use this medicine? This medicine is for injection or infusion into a vein. It is given by a health care professional in a hospital or clinic setting. Talk to your pediatrician regarding the use of this medicine in children. Special care may be needed. Overdosage: If you think you've taken too much of this medicine contact a poison control center or emergency room at once. Overdosage: If you think you have taken too much of this medicine contact a poison control center or emergency room at once. NOTE: This medicine is only for you. Do not share this medicine with others. What if I miss a dose? It is important not to miss your dose. Call your doctor or health care professional if you are unable to keep an appointment. What may interact with  this medicine? Interactions are not expected. Give your health care provider a list of all the medicines, herbs, non-prescription drugs, or dietary supplements you use. Also tell them if you smoke, drink alcohol, or use illegal drugs. Some items may interact with your medicine. This list may not describe all possible interactions. Give your health care provider a list of all the medicines, herbs, non-prescription drugs, or dietary supplements you use. Also tell them if you smoke, drink alcohol, or use illegal drugs. Some items may interact with your medicine. What should I watch for while using this medicine? Your condition will be monitored carefully while you are receiving this medicine. Report any side effects. Continue your course of treatment even though you feel ill unless your doctor tells you to stop. Call your doctor or health care professional for advice if you get a fever, chills or sore throat, or other symptoms of a cold or flu. Do not treat yourself. Try to avoid being around people who are sick. Do not become pregnant while taking this medicine. Women should inform their doctor if they wish to become pregnant or think they might be pregnant. There is a potential for serious side effects to an unborn child. Talk to your health care professional or pharmacist for more information. Do not breast-feed an infant while taking this medicine. Check with your doctor or health care professional if you get an attack of severe diarrhea, nausea and vomiting, or if you sweat a lot. The loss of too much   body fluid can make it dangerous for you to take this medicine. You may get dizzy. Do not drive, use machinery, or do anything that needs mental alertness until you know how this medicine affects you. Do not stand or sit up quickly, especially if you are an older patient. This reduces the risk of dizzy or fainting spells. What side effects may I notice from receiving this medicine? Side effects that you  should report to your doctor or health care professional as soon as possible: -allergic reactions like skin rash, itching or hives, swelling of the face, lips, or tongue -breathing problems -chest pain or palpitationschest tightness -cough -dark urine -dizziness -feeling faint or lightheaded -fever or chills -general ill feeling or flu-like symptoms -light-colored stools -palpitations -right upper belly pain -swelling of the legs or ankles -unusual bleeding or bruising -unusually weak or tired -yellowing of the eyes or skin  Side effects that usually do not require medical attention (Report these to your doctor or health care professional if they continue or are bothersome.): -diarrhea -headache -nausea, vomiting -tiredness This list may not describe all possible side effects. Call your doctor for medical advice about side effects. You may report side effects to FDA at 1-800-FDA-1088. Where should I keep my medicine? This drug is given in a hospital or clinic and will not be stored at home. NOTE: This sheet is a summary. It may not cover all possible information. If you have questions about this medicine, talk to your doctor, pharmacist, or health care provider.  2012, [employer]vier/Gold Standard. (03/12/2011 4:35:08 PM)Cyclophosphamide injection What is this medicine? CYCLOPHOSPHAMIDE (sye kloe FOSS fa mide) is a chemotherapy drug. It slows the growth of cancer cells. This medicine is used to treat many types of cancer like lymphoma, myeloma, leukemia, breast cancer, and ovarian cancer, to name a few. It is also used to treat nephrotic syndrome in children. This medicine may be used for other purposes; ask your health care provider or pharmacist if you have questions. What should I tell my health care provider before I take this medicine? They need to know if you have any of these conditions: -blood disorders -history of other chemotherapy -history of radiation  therapy -infection -kidney disease -liver disease -tumors in the bone marrow -an unusual or allergic reaction to cyclophosphamide, other chemotherapy, other medicines, foods, dyes, or preservatives -pregnant or trying to get pregnant -breast-feeding How should I use this medicine? This drug is usually given as an injection into a vein or muscle or by infusion into a vein. It is administered in a hospital or clinic by a specially trained health care professional. Talk to your pediatrician regarding the use of this medicine in children. While this drug may be prescribed for selected conditions, precautions do apply. Overdosage: If you think you have taken too much of this medicine contact a poison control center or emergency room at once. NOTE: This medicine is only for you. Do not share this medicine with others. What if I miss a dose? It is important not to miss your dose. Call your doctor or health care professional if you are unable to keep an appointment. What may interact with this medicine? Do not take this medicine with any of the following medications: -mibefradil -nalidixic acid This medicine may also interact with the following medications: -doxorubicin -etanercept -medicines to increase blood counts like filgrastim, pegfilgrastim, sargramostim -medicines that block muscle or nerve pain -St. John's Wort -phenobarbital -succinylcholine chloride -trastuzumab -vaccines Talk to your doctor or health care   professional before taking any of these medicines: -acetaminophen -aspirin -ibuprofen -ketoprofen -naproxen This list may not describe all possible interactions. Give your health care provider a list of all the medicines, herbs, non-prescription drugs, or dietary supplements you use. Also tell them if you smoke, drink alcohol, or use illegal drugs. Some items may interact with your medicine. What should I watch for while using this medicine? Visit your doctor for checks on  your progress. This drug may make you feel generally unwell. This is not uncommon, as chemotherapy can affect healthy cells as well as cancer cells. Report any side effects. Continue your course of treatment even though you feel ill unless your doctor tells you to stop. Drink water or other fluids as directed. Urinate often, even at night. In some cases, you may be given additional medicines to help with side effects. Follow all directions for their use. Call your doctor or health care professional for advice if you get a fever, chills or sore throat, or other symptoms of a cold or flu. Do not treat yourself. This drug decreases your body's ability to fight infections. Try to avoid being around people who are sick. This medicine may increase your risk to bruise or bleed. Call your doctor or health care professional if you notice any unusual bleeding. Be careful brushing and flossing your teeth or using a toothpick because you may get an infection or bleed more easily. If you have any dental work done, tell your dentist you are receiving this medicine. Avoid taking products that contain aspirin, acetaminophen, ibuprofen, naproxen, or ketoprofen unless instructed by your doctor. These medicines may hide a fever. Do not become pregnant while taking this medicine. Women should inform their doctor if they wish to become pregnant or think they might be pregnant. There is a potential for serious side effects to an unborn child. Talk to your health care professional or pharmacist for more information. Do not breast-feed an infant while taking this medicine. Men should inform their doctor if they wish to father a child. This medicine may lower sperm counts. If you are going to have surgery, tell your doctor or health care professional that you have taken this medicine. What side effects may I notice from receiving this medicine? Side effects that you should report to your doctor or health care professional as soon  as possible: -allergic reactions like skin rash, itching or hives, swelling of the face, lips, or tongue -low blood counts - this medicine may decrease the number of white blood cells, red blood cells and platelets. You may be at increased risk for infections and bleeding. -signs of infection - fever or chills, cough, sore throat, pain or difficulty passing urine -signs of decreased platelets or bleeding - bruising, pinpoint red spots on the skin, black, tarry stools, blood in the urine -signs of decreased red blood cells - unusually weak or tired, fainting spells, lightheadedness -breathing problems -dark urine -mouth sores -pain, swelling, redness at site where injected -swelling of the ankles, feet, hands -trouble passing urine or change in the amount of urine -weight gain -yellowing of the eyes or skin Side effects that usually do not require medical attention (report to your doctor or health care professional if they continue or are bothersome): -changes in nail or skin color -diarrhea -hair loss -loss of appetite -missed menstrual periods -nausea, vomiting -stomach pain This list may not describe all possible side effects. Call your doctor for medical advice about side effects. You may report side effects   to FDA at 1-800-FDA-1088. Where should I keep my medicine? This drug is given in a hospital or clinic and will not be stored at home. NOTE: This sheet is a summary. It may not cover all possible information. If you have questions about this medicine, talk to your doctor, pharmacist, or health care provider.  2012, [employer]vier/Gold Standard. (11/08/2007 2:32:25 PM) 

## 2012-05-24 ENCOUNTER — Ambulatory Visit (HOSPITAL_BASED_OUTPATIENT_CLINIC_OR_DEPARTMENT_OTHER): Payer: Medicare PPO

## 2012-05-24 VITALS — BP 148/85 | HR 75 | Temp 96.5°F | Resp 18

## 2012-05-24 DIAGNOSIS — C9002 Multiple myeloma in relapse: Secondary | ICD-10-CM

## 2012-05-24 DIAGNOSIS — Z5112 Encounter for antineoplastic immunotherapy: Secondary | ICD-10-CM

## 2012-05-24 MED ORDER — SODIUM CHLORIDE 0.9 % IV SOLN
Freq: Once | INTRAVENOUS | Status: AC
  Administered 2012-05-24: 13:00:00 via INTRAVENOUS

## 2012-05-24 MED ORDER — DEXAMETHASONE SODIUM PHOSPHATE 10 MG/ML IJ SOLN
10.0000 mg | Freq: Once | INTRAMUSCULAR | Status: AC
  Administered 2012-05-24: 10 mg via INTRAVENOUS

## 2012-05-24 MED ORDER — HEPARIN SOD (PORK) LOCK FLUSH 100 UNIT/ML IV SOLN
500.0000 [IU] | Freq: Once | INTRAVENOUS | Status: AC | PRN
  Administered 2012-05-24: 500 [IU]
  Filled 2012-05-24: qty 5

## 2012-05-24 MED ORDER — SODIUM CHLORIDE 0.9 % IJ SOLN
10.0000 mL | INTRAMUSCULAR | Status: DC | PRN
  Filled 2012-05-24: qty 10

## 2012-05-24 MED ORDER — DEXTROSE 5 % IV SOLN
20.0000 mg/m2 | Freq: Once | INTRAVENOUS | Status: AC
  Administered 2012-05-24: 44 mg via INTRAVENOUS
  Filled 2012-05-24: qty 22

## 2012-05-24 MED ORDER — ONDANSETRON 8 MG/50ML IVPB (CHCC)
8.0000 mg | Freq: Once | INTRAVENOUS | Status: AC
  Administered 2012-05-24: 8 mg via INTRAVENOUS

## 2012-05-24 NOTE — Patient Instructions (Signed)
Carfilzomib injection What is this medicine? CARFILZOMIB is a chemotherapy drug that works by slowing or stopping cancer cell growth. This medicine is used to treat multiple myeloma. This medicine may be used for other purposes; ask your health care provider or pharmacist if you have questions. What should I tell my health care provider before I take this medicine? They need to know if you have any of these conditions: -heart disease -irregular heartbeat -liver disease -lung or breathing disease -an unusual or allergic reaction to carfilzomib, or other medicines, foods, dyes, or preservatives -pregnant or trying to get pregnant -breast-feeding How should I use this medicine? This medicine is for injection or infusion into a vein. It is given by a health care professional in a hospital or clinic setting. Talk to your pediatrician regarding the use of this medicine in children. Special care may be needed. Overdosage: If you think you've taken too much of this medicine contact a poison control center or emergency room at once. Overdosage: If you think you have taken too much of this medicine contact a poison control center or emergency room at once. NOTE: This medicine is only for you. Do not share this medicine with others. What if I miss a dose? It is important not to miss your dose. Call your doctor or health care professional if you are unable to keep an appointment. What may interact with this medicine? Interactions are not expected. Give your health care provider a list of all the medicines, herbs, non-prescription drugs, or dietary supplements you use. Also tell them if you smoke, drink alcohol, or use illegal drugs. Some items may interact with your medicine. This list may not describe all possible interactions. Give your health care provider a list of all the medicines, herbs, non-prescription drugs, or dietary supplements you use. Also tell them if you smoke, drink alcohol, or use  illegal drugs. Some items may interact with your medicine. What should I watch for while using this medicine? Your condition will be monitored carefully while you are receiving this medicine. Report any side effects. Continue your course of treatment even though you feel ill unless your doctor tells you to stop. Call your doctor or health care professional for advice if you get a fever, chills or sore throat, or other symptoms of a cold or flu. Do not treat yourself. Try to avoid being around people who are sick. Do not become pregnant while taking this medicine. Women should inform their doctor if they wish to become pregnant or think they might be pregnant. There is a potential for serious side effects to an unborn child. Talk to your health care professional or pharmacist for more information. Do not breast-feed an infant while taking this medicine. Check with your doctor or health care professional if you get an attack of severe diarrhea, nausea and vomiting, or if you sweat a lot. The loss of too much body fluid can make it dangerous for you to take this medicine. You may get dizzy. Do not drive, use machinery, or do anything that needs mental alertness until you know how this medicine affects you. Do not stand or sit up quickly, especially if you are an older patient. This reduces the risk of dizzy or fainting spells. What side effects may I notice from receiving this medicine? Side effects that you should report to your doctor or health care professional as soon as possible: -allergic reactions like skin rash, itching or hives, swelling of the face, lips, or tongue -breathing   problems -chest pain or palpitationschest tightness -cough -dark urine -dizziness -feeling faint or lightheaded -fever or chills -general ill feeling or flu-like symptoms -light-colored stools -palpitations -right upper belly pain -swelling of the legs or ankles -unusual bleeding or bruising -unusually weak or  tired -yellowing of the eyes or skin  Side effects that usually do not require medical attention (Report these to your doctor or health care professional if they continue or are bothersome.): -diarrhea -headache -nausea, vomiting -tiredness This list may not describe all possible side effects. Call your doctor for medical advice about side effects. You may report side effects to FDA at 1-800-FDA-1088. Where should I keep my medicine? This drug is given in a hospital or clinic and will not be stored at home. NOTE: This sheet is a summary. It may not cover all possible information. If you have questions about this medicine, talk to your doctor, pharmacist, or health care provider.  2012, Elsevier/Gold Standard. (03/12/2011 4:35:08 PM) 

## 2012-05-25 ENCOUNTER — Other Ambulatory Visit: Payer: Self-pay | Admitting: *Deleted

## 2012-05-25 NOTE — Telephone Encounter (Signed)
error 

## 2012-05-30 ENCOUNTER — Ambulatory Visit (HOSPITAL_BASED_OUTPATIENT_CLINIC_OR_DEPARTMENT_OTHER): Payer: Medicare PPO | Admitting: Hematology & Oncology

## 2012-05-30 ENCOUNTER — Ambulatory Visit: Payer: Medicare PPO

## 2012-05-30 ENCOUNTER — Ambulatory Visit (HOSPITAL_BASED_OUTPATIENT_CLINIC_OR_DEPARTMENT_OTHER): Payer: Medicare PPO | Admitting: Lab

## 2012-05-30 ENCOUNTER — Other Ambulatory Visit: Payer: Medicare PPO | Admitting: Lab

## 2012-05-30 VITALS — BP 132/62 | HR 92 | Temp 98.0°F | Resp 18 | Ht 72.0 in | Wt 228.0 lb

## 2012-05-30 DIAGNOSIS — C9002 Multiple myeloma in relapse: Secondary | ICD-10-CM

## 2012-05-30 DIAGNOSIS — N289 Disorder of kidney and ureter, unspecified: Secondary | ICD-10-CM

## 2012-05-30 DIAGNOSIS — D649 Anemia, unspecified: Secondary | ICD-10-CM

## 2012-05-30 LAB — BASIC METABOLIC PANEL - CANCER CENTER ONLY
BUN, Bld: 14 mg/dL (ref 7–22)
Glucose, Bld: 194 mg/dL — ABNORMAL HIGH (ref 73–118)
Potassium: 4 mEq/L (ref 3.3–4.7)

## 2012-05-30 LAB — CBC WITH DIFFERENTIAL (CANCER CENTER ONLY)
BASO%: 0.2 % (ref 0.0–2.0)
EOS%: 1.7 % (ref 0.0–7.0)
HGB: 9.9 g/dL — ABNORMAL LOW (ref 13.0–17.1)
LYMPH#: 0.8 10*3/uL — ABNORMAL LOW (ref 0.9–3.3)
MCHC: 33.1 g/dL (ref 32.0–35.9)
MONO#: 0.5 10*3/uL (ref 0.1–0.9)
NEUT#: 3.3 10*3/uL (ref 1.5–6.5)
RDW: 16.7 % — ABNORMAL HIGH (ref 11.1–15.7)
WBC: 4.6 10*3/uL (ref 4.0–10.0)

## 2012-05-30 MED ORDER — DEXTROSE 5 % IV SOLN
20.0000 mg/m2 | Freq: Once | INTRAVENOUS | Status: AC
  Administered 2012-05-30: 44 mg via INTRAVENOUS
  Filled 2012-05-30: qty 22

## 2012-05-30 MED ORDER — SODIUM CHLORIDE 0.9 % IV SOLN: Freq: Once | INTRAVENOUS | Status: DC

## 2012-05-30 MED ORDER — ONDANSETRON 8 MG/50ML IVPB (CHCC)
8.0000 mg | Freq: Once | INTRAVENOUS | Status: AC
  Administered 2012-05-30: 8 mg via INTRAVENOUS

## 2012-05-30 MED ORDER — HEPARIN SOD (PORK) LOCK FLUSH 100 UNIT/ML IV SOLN
500.0000 [IU] | Freq: Once | INTRAVENOUS | Status: AC | PRN
  Administered 2012-05-30: 500 [IU]
  Filled 2012-05-30: qty 5

## 2012-05-30 MED ORDER — SODIUM CHLORIDE 0.9 % IV SOLN
Freq: Once | INTRAVENOUS | Status: AC
  Administered 2012-05-30: 10:00:00 via INTRAVENOUS

## 2012-05-30 MED ORDER — SODIUM CHLORIDE 0.9 % IJ SOLN
10.0000 mL | INTRAMUSCULAR | Status: DC | PRN
  Administered 2012-05-30: 10 mL
  Filled 2012-05-30: qty 10

## 2012-05-30 NOTE — Patient Instructions (Addendum)
Carfilzomib injection What is this medicine? CARFILZOMIB is a chemotherapy drug that works by slowing or stopping cancer cell growth. This medicine is used to treat multiple myeloma. This medicine may be used for other purposes; ask your health care provider or pharmacist if you have questions. What should I tell my health care provider before I take this medicine? They need to know if you have any of these conditions: -heart disease -irregular heartbeat -liver disease -lung or breathing disease -an unusual or allergic reaction to carfilzomib, or other medicines, foods, dyes, or preservatives -pregnant or trying to get pregnant -breast-feeding How should I use this medicine? This medicine is for injection or infusion into a vein. It is given by a health care professional in a hospital or clinic setting. Talk to your pediatrician regarding the use of this medicine in children. Special care may be needed. Overdosage: If you think you've taken too much of this medicine contact a poison control center or emergency room at once. Overdosage: If you think you have taken too much of this medicine contact a poison control center or emergency room at once. NOTE: This medicine is only for you. Do not share this medicine with others. What if I miss a dose? It is important not to miss your dose. Call your doctor or health care professional if you are unable to keep an appointment. What may interact with this medicine? Interactions are not expected. Give your health care provider a list of all the medicines, herbs, non-prescription drugs, or dietary supplements you use. Also tell them if you smoke, drink alcohol, or use illegal drugs. Some items may interact with your medicine. This list may not describe all possible interactions. Give your health care provider a list of all the medicines, herbs, non-prescription drugs, or dietary supplements you use. Also tell them if you smoke, drink alcohol, or use  illegal drugs. Some items may interact with your medicine. What should I watch for while using this medicine? Your condition will be monitored carefully while you are receiving this medicine. Report any side effects. Continue your course of treatment even though you feel ill unless your doctor tells you to stop. Call your doctor or health care professional for advice if you get a fever, chills or sore throat, or other symptoms of a cold or flu. Do not treat yourself. Try to avoid being around people who are sick. Do not become pregnant while taking this medicine. Women should inform their doctor if they wish to become pregnant or think they might be pregnant. There is a potential for serious side effects to an unborn child. Talk to your health care professional or pharmacist for more information. Do not breast-feed an infant while taking this medicine. Check with your doctor or health care professional if you get an attack of severe diarrhea, nausea and vomiting, or if you sweat a lot. The loss of too much body fluid can make it dangerous for you to take this medicine. You may get dizzy. Do not drive, use machinery, or do anything that needs mental alertness until you know how this medicine affects you. Do not stand or sit up quickly, especially if you are an older patient. This reduces the risk of dizzy or fainting spells. What side effects may I notice from receiving this medicine? Side effects that you should report to your doctor or health care professional as soon as possible: -allergic reactions like skin rash, itching or hives, swelling of the face, lips, or tongue -breathing   problems -chest pain or palpitationschest tightness -cough -dark urine -dizziness -feeling faint or lightheaded -fever or chills -general ill feeling or flu-like symptoms -light-colored stools -palpitations -right upper belly pain -swelling of the legs or ankles -unusual bleeding or bruising -unusually weak or  tired -yellowing of the eyes or skin  Side effects that usually do not require medical attention (Report these to your doctor or health care professional if they continue or are bothersome.): -diarrhea -headache -nausea, vomiting -tiredness This list may not describe all possible side effects. Call your doctor for medical advice about side effects. You may report side effects to FDA at 1-800-FDA-1088. Where should I keep my medicine? This drug is given in a hospital or clinic and will not be stored at home. NOTE: This sheet is a summary. It may not cover all possible information. If you have questions about this medicine, talk to your doctor, pharmacist, or health care provider.  2012, [employer]. (03/12/2011 4:35:08 PM) 

## 2012-05-30 NOTE — Progress Notes (Signed)
This office note has been dictated.

## 2012-05-31 ENCOUNTER — Ambulatory Visit (HOSPITAL_BASED_OUTPATIENT_CLINIC_OR_DEPARTMENT_OTHER): Payer: Medicare PPO

## 2012-05-31 ENCOUNTER — Other Ambulatory Visit: Payer: Self-pay | Admitting: *Deleted

## 2012-05-31 ENCOUNTER — Other Ambulatory Visit: Payer: Self-pay | Admitting: Radiology

## 2012-05-31 VITALS — BP 138/74 | HR 91 | Temp 97.3°F | Resp 20

## 2012-05-31 DIAGNOSIS — C9002 Multiple myeloma in relapse: Secondary | ICD-10-CM

## 2012-05-31 DIAGNOSIS — Z5112 Encounter for antineoplastic immunotherapy: Secondary | ICD-10-CM

## 2012-05-31 MED ORDER — SODIUM CHLORIDE 0.9 % IV SOLN
Freq: Once | INTRAVENOUS | Status: AC
  Administered 2012-05-31: 13:00:00 via INTRAVENOUS

## 2012-05-31 MED ORDER — HEPARIN SOD (PORK) LOCK FLUSH 100 UNIT/ML IV SOLN
500.0000 [IU] | Freq: Once | INTRAVENOUS | Status: AC | PRN
  Administered 2012-05-31: 500 [IU]
  Filled 2012-05-31: qty 5

## 2012-05-31 MED ORDER — DEXTROSE 5 % IV SOLN
20.0000 mg/m2 | Freq: Once | INTRAVENOUS | Status: AC
  Administered 2012-05-31: 44 mg via INTRAVENOUS
  Filled 2012-05-31: qty 22

## 2012-05-31 MED ORDER — ONDANSETRON 8 MG/50ML IVPB (CHCC)
8.0000 mg | Freq: Once | INTRAVENOUS | Status: AC
  Administered 2012-05-31: 8 mg via INTRAVENOUS

## 2012-05-31 MED ORDER — SITAGLIPTIN PHOSPHATE 100 MG PO TABS: 100.0000 mg | ORAL_TABLET | Freq: Every day | ORAL | Status: DC

## 2012-05-31 MED ORDER — SODIUM CHLORIDE 0.9 % IJ SOLN
10.0000 mL | INTRAMUSCULAR | Status: DC | PRN
  Administered 2012-05-31: 10 mL
  Filled 2012-05-31: qty 10

## 2012-05-31 NOTE — Patient Instructions (Signed)
Carfilzomib injection What is this medicine? CARFILZOMIB is a chemotherapy drug that works by slowing or stopping cancer cell growth. This medicine is used to treat multiple myeloma. This medicine may be used for other purposes; ask your health care provider or pharmacist if you have questions. What should I tell my health care provider before I take this medicine? They need to know if you have any of these conditions: -heart disease -irregular heartbeat -liver disease -lung or breathing disease -an unusual or allergic reaction to carfilzomib, or other medicines, foods, dyes, or preservatives -pregnant or trying to get pregnant -breast-feeding How should I use this medicine? This medicine is for injection or infusion into a vein. It is given by a health care professional in a hospital or clinic setting. Talk to your pediatrician regarding the use of this medicine in children. Special care may be needed. Overdosage: If you think you've taken too much of this medicine contact a poison control center or emergency room at once. Overdosage: If you think you have taken too much of this medicine contact a poison control center or emergency room at once. NOTE: This medicine is only for you. Do not share this medicine with others. What if I miss a dose? It is important not to miss your dose. Call your doctor or health care professional if you are unable to keep an appointment. What may interact with this medicine? Interactions are not expected. Give your health care provider a list of all the medicines, herbs, non-prescription drugs, or dietary supplements you use. Also tell them if you smoke, drink alcohol, or use illegal drugs. Some items may interact with your medicine. This list may not describe all possible interactions. Give your health care provider a list of all the medicines, herbs, non-prescription drugs, or dietary supplements you use. Also tell them if you smoke, drink alcohol, or use  illegal drugs. Some items may interact with your medicine. What should I watch for while using this medicine? Your condition will be monitored carefully while you are receiving this medicine. Report any side effects. Continue your course of treatment even though you feel ill unless your doctor tells you to stop. Call your doctor or health care professional for advice if you get a fever, chills or sore throat, or other symptoms of a cold or flu. Do not treat yourself. Try to avoid being around people who are sick. Do not become pregnant while taking this medicine. Women should inform their doctor if they wish to become pregnant or think they might be pregnant. There is a potential for serious side effects to an unborn child. Talk to your health care professional or pharmacist for more information. Do not breast-feed an infant while taking this medicine. Check with your doctor or health care professional if you get an attack of severe diarrhea, nausea and vomiting, or if you sweat a lot. The loss of too much body fluid can make it dangerous for you to take this medicine. You may get dizzy. Do not drive, use machinery, or do anything that needs mental alertness until you know how this medicine affects you. Do not stand or sit up quickly, especially if you are an older patient. This reduces the risk of dizzy or fainting spells. What side effects may I notice from receiving this medicine? Side effects that you should report to your doctor or health care professional as soon as possible: -allergic reactions like skin rash, itching or hives, swelling of the face, lips, or tongue -breathing   problems -chest pain or palpitationschest tightness -cough -dark urine -dizziness -feeling faint or lightheaded -fever or chills -general ill feeling or flu-like symptoms -light-colored stools -palpitations -right upper belly pain -swelling of the legs or ankles -unusual bleeding or bruising -unusually weak or  tired -yellowing of the eyes or skin  Side effects that usually do not require medical attention (Report these to your doctor or health care professional if they continue or are bothersome.): -diarrhea -headache -nausea, vomiting -tiredness This list may not describe all possible side effects. Call your doctor for medical advice about side effects. You may report side effects to FDA at 1-800-FDA-1088. Where should I keep my medicine? This drug is given in a hospital or clinic and will not be stored at home. NOTE: This sheet is a summary. It may not cover all possible information. If you have questions about this medicine, talk to your doctor, pharmacist, or health care provider.  2012, Elsevier/Gold Standard. (03/12/2011 4:35:08 PM) 

## 2012-05-31 NOTE — Progress Notes (Signed)
CC:   Raye Sorrow, MD [name], M.D.  DIAGNOSES: 1. Recurrent lambda light chain myeloma. 2. Anemia secondary to renal insufficiency.  CURRENT THERAPY: 1. Kyprolis/Cytoxan, status post 1 cycle. 2. Zometa 3.3 mg IV q.month.  INTERIM HISTORY:  Arthur Valdez comes in for his followup.  I think he has done pretty well with treatment.  He says he is not sleeping well.  He says he does not want to do the Decadron.  I think we can hold on the Decadron for right now.  He has not heard from [facility] about enrolling into the clinical trial.  Hopefully, this will happen soon.  He has had no bleeding.  He has had no leg swelling.  He is watching his blood sugars.  There has been no change in bowel or bladder habits.  He has had no nausea or vomiting.  PHYSICAL EXAMINATION:  General:  This is a well-developed, well- nourished black gentleman in no obvious distress.  Vital signs:  98, pulse 92, respiratory rate 18, blood pressure 132/62.  Weight is 228. Head and neck:  Shows a normocephalic, atraumatic skull.  There are no ocular or oral lesions.  There are no palpable cervical or supraclavicular lymph nodes.  Lungs:  Clear bilaterally.  Cardiac: Regular rate and rhythm with a normal S1 and S2.  There are no murmurs, rubs or bruits.  Abdomen:  Soft with good bowel sounds.  There is no palpable abdominal mass.  There is no palpable hepatosplenomegaly. Extremities:  Shows no clubbing, cyanosis or edema.  Neurological:  Exam shows no focal neurological deficits.  LABORATORY STUDIES:  White cell count is 4.6, hemoglobin 9.9, hematocrit 29.9 and platelet count 187.  BUN is 14, creatinine 1.3.  IMPRESSION:  [name] is a 78 year old gentleman with recurrent lambda light chain myeloma.  He actually has done well.  His renal function seems to be improving a little bit so this hopefully will be a good sign for [place].  We will go ahead and continue his treatment.  Hopefully, he will  be hearing from [facility] within a month or so.    ______________________________ Arthur Valdez, M.D. PRE/MEDQ  D:  05/30/2012  T:  05/31/2012  Job:  [id number]

## 2012-06-01 LAB — IGG, IGA, IGM: IgG (Immunoglobin G), Serum: 592 mg/dL — ABNORMAL LOW (ref 650–1600)

## 2012-06-01 LAB — KAPPA/LAMBDA LIGHT CHAINS
Kappa:Lambda Ratio: 0.01 — ABNORMAL LOW (ref 0.26–1.65)
Lambda Free Lght Chn: 139 mg/dL — ABNORMAL HIGH (ref 0.57–2.63)

## 2012-06-01 LAB — PROTEIN ELECTROPHORESIS, SERUM, WITH REFLEX
Alpha-2-Globulin: 12.5 % — ABNORMAL HIGH (ref 7.1–11.8)
Beta 2: 5.1 % (ref 3.2–6.5)
Beta Globulin: 6.9 % (ref 4.7–7.2)
Gamma Globulin: 8.7 % — ABNORMAL LOW (ref 11.1–18.8)

## 2012-06-06 ENCOUNTER — Other Ambulatory Visit: Payer: Self-pay | Admitting: Medical

## 2012-06-13 ENCOUNTER — Ambulatory Visit (HOSPITAL_BASED_OUTPATIENT_CLINIC_OR_DEPARTMENT_OTHER): Payer: Medicare PPO

## 2012-06-13 ENCOUNTER — Ambulatory Visit (HOSPITAL_BASED_OUTPATIENT_CLINIC_OR_DEPARTMENT_OTHER): Payer: Medicare PPO | Admitting: Medical

## 2012-06-13 ENCOUNTER — Other Ambulatory Visit: Payer: Medicare PPO | Admitting: Lab

## 2012-06-13 ENCOUNTER — Ambulatory Visit (HOSPITAL_BASED_OUTPATIENT_CLINIC_OR_DEPARTMENT_OTHER): Payer: Medicare PPO | Admitting: Lab

## 2012-06-13 ENCOUNTER — Ambulatory Visit: Payer: Medicare PPO

## 2012-06-13 DIAGNOSIS — C9002 Multiple myeloma in relapse: Secondary | ICD-10-CM

## 2012-06-13 DIAGNOSIS — Z5112 Encounter for antineoplastic immunotherapy: Secondary | ICD-10-CM

## 2012-06-13 LAB — CBC WITH DIFFERENTIAL (CANCER CENTER ONLY)
Eosinophils Absolute: 0.1 10*3/uL (ref 0.0–0.5)
LYMPH%: 21.9 % (ref 14.0–48.0)
MCH: 29.9 pg (ref 28.0–33.4)
MCV: 93 fL (ref 82–98)
MONO%: 14.6 % — ABNORMAL HIGH (ref 0.0–13.0)
NEUT%: 60.9 % (ref 40.0–80.0)
Platelets: 184 10*3/uL (ref 145–400)
RBC: 3.34 10*6/uL — ABNORMAL LOW (ref 4.20–5.70)
RDW: 17.1 % — ABNORMAL HIGH (ref 11.1–15.7)

## 2012-06-13 MED ORDER — DEXTROSE 5 % IV SOLN
27.0000 mg/m2 | Freq: Once | INTRAVENOUS | Status: AC
  Administered 2012-06-13: 60 mg via INTRAVENOUS
  Filled 2012-06-13: qty 30

## 2012-06-13 MED ORDER — SODIUM CHLORIDE 0.9 % IV SOLN
Freq: Once | INTRAVENOUS | Status: AC
  Administered 2012-06-13: 10:00:00 via INTRAVENOUS

## 2012-06-13 MED ORDER — SODIUM CHLORIDE 0.9 % IJ SOLN
10.0000 mL | INTRAMUSCULAR | Status: DC | PRN
  Administered 2012-06-13: 10 mL
  Filled 2012-06-13: qty 10

## 2012-06-13 MED ORDER — SODIUM CHLORIDE 0.9 % IV SOLN
300.0000 mg/m2 | Freq: Once | INTRAVENOUS | Status: AC
  Administered 2012-06-13: 660 mg via INTRAVENOUS
  Filled 2012-06-13: qty 33

## 2012-06-13 MED ORDER — HEPARIN SOD (PORK) LOCK FLUSH 100 UNIT/ML IV SOLN
500.0000 [IU] | Freq: Once | INTRAVENOUS | Status: AC | PRN
  Administered 2012-06-13: 500 [IU]
  Filled 2012-06-13: qty 5

## 2012-06-13 MED ORDER — ONDANSETRON 8 MG/50ML IVPB (CHCC)
8.0000 mg | Freq: Once | INTRAVENOUS | Status: AC
  Administered 2012-06-13: 8 mg via INTRAVENOUS

## 2012-06-13 NOTE — Progress Notes (Signed)
Diagnoses: #1 recurrent, lambda light chain myeloma. #2 anemia, secondary to renal insufficiency.  Current therapy: #1 Kyprolis/Cytoxan, status post 1 cycle #2 Zometa 3.3 mg IV q. Monthly  Interim history: Arthur Valdez presents today for an office followup visit.  Arthur Valdez has completed his first cycle of Kyprolis/Cytoxan/Decadron.  Of note, we did hold the Decadron onn his last visit as it was interfering with his sleep.  Arthur Valdez, reports, that, since she's been off the Decadron his sleep has improved.  Arthur Valdez, reports, that he did receive a phone call from [facility] this past Sunday.  Arthur Valdez was interested in being enrolled in a clinical trial.  [facility], informed Arthur Valdez, that since the Kyprolis/Cytoxan is working well and he is having a nice response to it that they would just continue him on the same regimen.  Arthur Valdez agreed with this plan and is holding off on the clinical trial right now.  His last Or free light chain was 0.76 mg/dL.  His lambda free light chain came down from 262 to 139 mg/dL on 16/10/960.  His creatinine has improved as well and has been holding steady at 1.3.  He does take Vicodin intermittently for some back pain.  He does have some intermittent neuropathy, however, does not interfere with any fine f in.  He has a decent appetite.  He denies any nausea, vomiting, diarrhea, constipation, any chest pain, shortness of breath, or cough.  He denies any fevers, chills, or night sweats.  He denies any type of lower leg swelling.  He denies any obvious, or abnormal bleeding.  He denies any dysphasia or odynophagia.  He denies any headaches, visual changes, or rashes.  Review of Systems: Pt. Denies any changes in their vision, hearing, adenopathy, fevers, chills, nausea, vomiting, diarrhea, constipation, chest pain, shortness of breath, passing blood, passing out, blacking out,  any changes in skin, joints, neurologic or psychiatric except as noted.  Physical Exam: This is a pleasant,  78 year old, well-developed, well-nourished, African American, gentleman, in no obvious distress Vitals: Temperature 97.3 degrees, pulse 80, respirations 20, blood pressure 133%.  He, 6, weight 229 pounds HEENT reveals a normocephalic, atraumatic skull, no scleral icterus, no oral lesions  Neck is supple without any cervical or supraclavicular adenopathy.  Lungs are clear to auscultation bilaterally. There are no wheezes, rales or rhonci Cardiac is regular rate and rhythm with a normal S1 and S2. There are no murmurs, rubs, or bruits.  Abdomen is soft with good bowel sounds, there is no palpable mass. There is no palpable hepatosplenomegaly. There is no palpable fluid wave.  Musculoskeletal no tenderness of the spine, ribs, or hips.  Extremities there are no clubbing, cyanosis, or edema.  Skin no petechia, purpura or ecchymosis Neurologic is nonfocal.  Laboratory Data: White count 3.8, hemoglobin 10.0, hematocrit 31.2, platelets 184,000  Current Outpatient Prescriptions on File Prior to Visit  Medication Sig Dispense Refill  . acyclovir (ZOVIRAX) 400 MG tablet Take 1 tablet (400 mg total) by mouth daily.  30 tablet  3  . albuterol (PROVENTIL HFA) 108 (90 BASE) MCG/ACT inhaler Inhale 2 puffs into the lungs every 4 (four) hours as needed.  2 Inhaler  6  . amLODipine (NORVASC) 10 MG tablet Take 10 mg by mouth daily before breakfast.       . B Complex-C (B-COMPLEX WITH VITAMIN C) tablet Take 1 tablet by mouth daily.      . clarithromycin (BIAXIN) 500 MG tablet Take 1 tablet (500 mg  total) by mouth 2 (two) times daily.  60 tablet  5  . clotrimazole (MYCELEX) 10 MG troche Take 1 tablet (10 mg total) by mouth 3 (three) times daily as needed. Mouth sore  90 tablet  0  . dexamethasone (DECADRON) 4 MG tablet Take 5 tablets once a week; 3 weeks on 1 week off; Repeat with each cycle of Pomalyst  45 tablet  3  . fidaxomicin (DIFICID) 200 MG TABS tablet Take 200 mg by mouth 2 (two) times daily. Take 200mg   twice a day for 10 days      . HYDROcodone-acetaminophen (VICODIN) 5-500 MG per tablet       . ibuprofen (ADVIL,MOTRIN) 200 MG tablet Take 400 mg by mouth every 8 (eight) hours as needed. Pain      . insulin glargine (LANTUS) 100 UNIT/ML injection Inject 5 Units into the skin at bedtime. Pt only take if bs > 125      . levothyroxine (SYNTHROID, LEVOTHROID) 50 MCG tablet Take 50 mcg by mouth daily before breakfast.      . LORazepam (ATIVAN) 2 MG tablet Take 1 tablet (2 mg total) by mouth every 12 (twelve) hours as needed. Nausea  30 tablet  3  . omeprazole (PRILOSEC) 20 MG capsule Take 20 mg by mouth daily.      . ondansetron (ZOFRAN) 8 MG tablet Take 1 tablet (8 mg total) by mouth 2 (two) times daily. Take 1 tablet by mouth (8 mg ) in the am and one tablet by mouth in the pm beginning day 3 or Wednesday, 05/18/12 for 3 days total.  20 tablet  3  . pomalidomide (POMALYST) 3 MG capsule Take 1 capsule (3 mg total) by mouth daily. 21 days on 7 days off. Auth # [id number]  21 capsule  0  . promethazine (PHENERGAN) 25 MG tablet Take 0.5 tablets (12.5 mg total) by mouth every 6 (six) hours as needed for nausea.  30 tablet  0  . sitaGLIPtin (JANUVIA) 100 MG tablet Take 1 tablet (100 mg total) by mouth daily before breakfast.  30 tablet  0  . temazepam (RESTORIL) 15 MG capsule Take 1 capsule (15 mg total) by mouth at bedtime as needed for sleep.  30 capsule  1  . traMADol (ULTRAM) 50 MG tablet TAKE 1 TO 2 TABLETS EVERY 6 TO 8 HOURS AS NEEDED FOR PAIN  120 tablet  0  . DISCONTD: prochlorperazine (COMPAZINE) 10 MG tablet Take 1 tablet (10 mg total) by mouth every 6 (six) hours as needed (Nausea or vomiting).  30 tablet  1    Assessment/Plan:  This is a pleasant, 78 year old, African American, gentleman, with the following issues:  #1 recurrent, lambda light chain myeloma.  So far, he has done well on Kyprolis/Cytoxan.  He will go ahead and start his second cycle today.  Overall, Arthur Valdez seems to be having a  nice response to the Kyprolis/Cytoxan.  His lambda free light chains have really improved, as such, we will continue on the current regimen.  [facility] agrees with this plan.  Arthur Valdez.  We'll hold off on any clinical trials at this time.  #2.  Supportive therapy.  He, also continues to receive IV Zometa 3.5 mg monthly.  #3 insomnia.  This has improved since Arthur Valdez has been off the Decadron.  #4 intermittent back pain.  Arthur Valdez has Vicodin as needed.  #5.  Followup.  Arthur Valdez will follow back up with [place] on 07/11/2012, but before  then should there be questions or concerns.  #3

## 2012-06-13 NOTE — Patient Instructions (Signed)
Cyclophosphamide injection What is this medicine? CYCLOPHOSPHAMIDE (sye kloe FOSS fa mide) is a chemotherapy drug. It slows the growth of cancer cells. This medicine is used to treat many types of cancer like lymphoma, myeloma, leukemia, breast cancer, and ovarian cancer, to name a few. It is also used to treat nephrotic syndrome in children. This medicine may be used for other purposes; ask your health care provider or pharmacist if you have questions. What should I tell my health care provider before I take this medicine? They need to know if you have any of these conditions: -blood disorders -history of other chemotherapy -history of radiation therapy -infection -kidney disease -liver disease -tumors in the bone marrow -an unusual or allergic reaction to cyclophosphamide, other chemotherapy, other medicines, foods, dyes, or preservatives -pregnant or trying to get pregnant -breast-feeding How should I use this medicine? This drug is usually given as an injection into a vein or muscle or by infusion into a vein. It is administered in a hospital or clinic by a specially trained health care professional. Talk to your pediatrician regarding the use of this medicine in children. While this drug may be prescribed for selected conditions, precautions do apply. Overdosage: If you think you have taken too much of this medicine contact a poison control center or emergency room at once. NOTE: This medicine is only for you. Do not share this medicine with others. What if I miss a dose? It is important not to miss your dose. Call your doctor or health care professional if you are unable to keep an appointment. What may interact with this medicine? Do not take this medicine with any of the following medications: -mibefradil -nalidixic acid This medicine may also interact with the following medications: -doxorubicin -etanercept -medicines to increase blood counts like filgrastim, pegfilgrastim,  sargramostim -medicines that block muscle or nerve pain -St. John's Wort -phenobarbital -succinylcholine chloride -trastuzumab -vaccines Talk to your doctor or health care professional before taking any of these medicines: -acetaminophen -aspirin -ibuprofen -ketoprofen -naproxen This list may not describe all possible interactions. Give your health care provider a list of all the medicines, herbs, non-prescription drugs, or dietary supplements you use. Also tell them if you smoke, drink alcohol, or use illegal drugs. Some items may interact with your medicine. What should I watch for while using this medicine? Visit your doctor for checks on your progress. This drug may make you feel generally unwell. This is not uncommon, as chemotherapy can affect healthy cells as well as cancer cells. Report any side effects. Continue your course of treatment even though you feel ill unless your doctor tells you to stop. Drink water or other fluids as directed. Urinate often, even at night. In some cases, you may be given additional medicines to help with side effects. Follow all directions for their use. Call your doctor or health care professional for advice if you get a fever, chills or sore throat, or other symptoms of a cold or flu. Do not treat yourself. This drug decreases your body's ability to fight infections. Try to avoid being around people who are sick. This medicine may increase your risk to bruise or bleed. Call your doctor or health care professional if you notice any unusual bleeding. Be careful brushing and flossing your teeth or using a toothpick because you may get an infection or bleed more easily. If you have any dental work done, tell your dentist you are receiving this medicine. Avoid taking products that contain aspirin, acetaminophen, ibuprofen, naproxen,   or ketoprofen unless instructed by your doctor. These medicines may hide a fever. Do not become pregnant while taking this  medicine. Women should inform their doctor if they wish to become pregnant or think they might be pregnant. There is a potential for serious side effects to an unborn child. Talk to your health care professional or pharmacist for more information. Do not breast-feed an infant while taking this medicine. Men should inform their doctor if they wish to father a child. This medicine may lower sperm counts. If you are going to have surgery, tell your doctor or health care professional that you have taken this medicine. What side effects may I notice from receiving this medicine? Side effects that you should report to your doctor or health care professional as soon as possible: -allergic reactions like skin rash, itching or hives, swelling of the face, lips, or tongue -low blood counts - this medicine may decrease the number of white blood cells, red blood cells and platelets. You may be at increased risk for infections and bleeding. -signs of infection - fever or chills, cough, sore throat, pain or difficulty passing urine -signs of decreased platelets or bleeding - bruising, pinpoint red spots on the skin, black, tarry stools, blood in the urine -signs of decreased red blood cells - unusually weak or tired, fainting spells, lightheadedness -breathing problems -dark urine -mouth sores -pain, swelling, redness at site where injected -swelling of the ankles, feet, hands -trouble passing urine or change in the amount of urine -weight gain -yellowing of the eyes or skin Side effects that usually do not require medical attention (report to your doctor or health care professional if they continue or are bothersome): -changes in nail or skin color -diarrhea -hair loss -loss of appetite -missed menstrual periods -nausea, vomiting -stomach pain This list may not describe all possible side effects. Call your doctor for medical advice about side effects. You may report side effects to FDA at  1-800-FDA-1088. Where should I keep my medicine? This drug is given in a hospital or clinic and will not be stored at home. NOTE: This sheet is a summary. It may not cover all possible information. If you have questions about this medicine, talk to your doctor, pharmacist, or health care provider.  2012, [employer]. (11/08/2007 2:32:25 PM)Carfilzomib injection What is this medicine? CARFILZOMIB is a chemotherapy drug that works by slowing or stopping cancer cell growth. This medicine is used to treat multiple myeloma. This medicine may be used for other purposes; ask your health care provider or pharmacist if you have questions. What should I tell my health care provider before I take this medicine? They need to know if you have any of these conditions: -heart disease -irregular heartbeat -liver disease -lung or breathing disease -an unusual or allergic reaction to carfilzomib, or other medicines, foods, dyes, or preservatives -pregnant or trying to get pregnant -breast-feeding How should I use this medicine? This medicine is for injection or infusion into a vein. It is given by a health care professional in a hospital or clinic setting. Talk to your pediatrician regarding the use of this medicine in children. Special care may be needed. Overdosage: If you think you've taken too much of this medicine contact a poison control center or emergency room at once. Overdosage: If you think you have taken too much of this medicine contact a poison control center or emergency room at once. NOTE: This medicine is only for you. Do not share this medicine with others.   What if I miss a dose? It is important not to miss your dose. Call your doctor or health care professional if you are unable to keep an appointment. What may interact with this medicine? Interactions are not expected. Give your health care provider a list of all the medicines, herbs, non-prescription drugs, or dietary  supplements you use. Also tell them if you smoke, drink alcohol, or use illegal drugs. Some items may interact with your medicine. This list may not describe all possible interactions. Give your health care provider a list of all the medicines, herbs, non-prescription drugs, or dietary supplements you use. Also tell them if you smoke, drink alcohol, or use illegal drugs. Some items may interact with your medicine. What should I watch for while using this medicine? Your condition will be monitored carefully while you are receiving this medicine. Report any side effects. Continue your course of treatment even though you feel ill unless your doctor tells you to stop. Call your doctor or health care professional for advice if you get a fever, chills or sore throat, or other symptoms of a cold or flu. Do not treat yourself. Try to avoid being around people who are sick. Do not become pregnant while taking this medicine. Women should inform their doctor if they wish to become pregnant or think they might be pregnant. There is a potential for serious side effects to an unborn child. Talk to your health care professional or pharmacist for more information. Do not breast-feed an infant while taking this medicine. Check with your doctor or health care professional if you get an attack of severe diarrhea, nausea and vomiting, or if you sweat a lot. The loss of too much body fluid can make it dangerous for you to take this medicine. You may get dizzy. Do not drive, use machinery, or do anything that needs mental alertness until you know how this medicine affects you. Do not stand or sit up quickly, especially if you are an older patient. This reduces the risk of dizzy or fainting spells. What side effects may I notice from receiving this medicine? Side effects that you should report to your doctor or health care professional as soon as possible: -allergic reactions like skin rash, itching or hives, swelling of the  face, lips, or tongue -breathing problems -chest pain or palpitationschest tightness -cough -dark urine -dizziness -feeling faint or lightheaded -fever or chills -general ill feeling or flu-like symptoms -light-colored stools -palpitations -right upper belly pain -swelling of the legs or ankles -unusual bleeding or bruising -unusually weak or tired -yellowing of the eyes or skin  Side effects that usually do not require medical attention (Report these to your doctor or health care professional if they continue or are bothersome.): -diarrhea -headache -nausea, vomiting -tiredness This list may not describe all possible side effects. Call your doctor for medical advice about side effects. You may report side effects to FDA at 1-800-FDA-1088. Where should I keep my medicine? This drug is given in a hospital or clinic and will not be stored at home. NOTE: This sheet is a summary. It may not cover all possible information. If you have questions about this medicine, talk to your doctor, pharmacist, or health care provider.  2012, [employer]. (03/12/2011 4:35:08 PM) 

## 2012-06-14 ENCOUNTER — Ambulatory Visit (HOSPITAL_BASED_OUTPATIENT_CLINIC_OR_DEPARTMENT_OTHER): Payer: Medicare PPO

## 2012-06-14 VITALS — BP 119/69 | HR 96 | Temp 97.9°F | Resp 18

## 2012-06-14 DIAGNOSIS — Z5112 Encounter for antineoplastic immunotherapy: Secondary | ICD-10-CM

## 2012-06-14 DIAGNOSIS — C9002 Multiple myeloma in relapse: Secondary | ICD-10-CM

## 2012-06-14 MED ORDER — ONDANSETRON 8 MG/50ML IVPB (CHCC)
8.0000 mg | Freq: Once | INTRAVENOUS | Status: AC
  Administered 2012-06-14: 8 mg via INTRAVENOUS

## 2012-06-14 MED ORDER — HEPARIN SOD (PORK) LOCK FLUSH 100 UNIT/ML IV SOLN
500.0000 [IU] | Freq: Once | INTRAVENOUS | Status: AC | PRN
  Administered 2012-06-14: 500 [IU]
  Filled 2012-06-14: qty 5

## 2012-06-14 MED ORDER — ZOLEDRONIC ACID 4 MG/5ML IV CONC
3.5000 mg | Freq: Once | INTRAVENOUS | Status: AC
  Administered 2012-06-14: 3.5 mg via INTRAVENOUS
  Filled 2012-06-14: qty 4.38

## 2012-06-14 MED ORDER — HEPARIN SOD (PORK) LOCK FLUSH 100 UNIT/ML IV SOLN
500.0000 [IU] | Freq: Once | INTRAVENOUS | Status: DC | PRN
  Filled 2012-06-14: qty 5

## 2012-06-14 MED ORDER — SODIUM CHLORIDE 0.9 % IJ SOLN
10.0000 mL | INTRAMUSCULAR | Status: DC | PRN
  Administered 2012-06-14: 10 mL
  Filled 2012-06-14: qty 10

## 2012-06-14 MED ORDER — SODIUM CHLORIDE 0.9 % IV SOLN
Freq: Once | INTRAVENOUS | Status: AC
  Administered 2012-06-14: 13:00:00 via INTRAVENOUS

## 2012-06-14 MED ORDER — DEXTROSE 5 % IV SOLN
27.0000 mg/m2 | Freq: Once | INTRAVENOUS | Status: AC
  Administered 2012-06-14: 60 mg via INTRAVENOUS
  Filled 2012-06-14: qty 30

## 2012-06-14 MED ORDER — SODIUM CHLORIDE 0.9 % IJ SOLN
10.0000 mL | INTRAMUSCULAR | Status: DC | PRN
  Filled 2012-06-14: qty 10

## 2012-06-14 NOTE — Patient Instructions (Signed)
Cyclophosphamide injection What is this medicine? CYCLOPHOSPHAMIDE (sye kloe FOSS fa mide) is a chemotherapy drug. It slows the growth of cancer cells. This medicine is used to treat many types of cancer like lymphoma, myeloma, leukemia, breast cancer, and ovarian cancer, to name a few. It is also used to treat nephrotic syndrome in children. This medicine may be used for other purposes; ask your health care provider or pharmacist if you have questions. What should I tell my health care provider before I take this medicine? They need to know if you have any of these conditions: -blood disorders -history of other chemotherapy -history of radiation therapy -infection -kidney disease -liver disease -tumors in the bone marrow -an unusual or allergic reaction to cyclophosphamide, other chemotherapy, other medicines, foods, dyes, or preservatives -pregnant or trying to get pregnant -breast-feeding How should I use this medicine? This drug is usually given as an injection into a vein or muscle or by infusion into a vein. It is administered in a hospital or clinic by a specially trained health care professional. Talk to your pediatrician regarding the use of this medicine in children. While this drug may be prescribed for selected conditions, precautions do apply. Overdosage: If you think you have taken too much of this medicine contact a poison control center or emergency room at once. NOTE: This medicine is only for you. Do not share this medicine with others. What if I miss a dose? It is important not to miss your dose. Call your doctor or health care professional if you are unable to keep an appointment. What may interact with this medicine? Do not take this medicine with any of the following medications: -mibefradil -nalidixic acid This medicine may also interact with the following medications: -doxorubicin -etanercept -medicines to increase blood counts like filgrastim, pegfilgrastim,  sargramostim -medicines that block muscle or nerve pain -St. John's Wort -phenobarbital -succinylcholine chloride -trastuzumab -vaccines Talk to your doctor or health care professional before taking any of these medicines: -acetaminophen -aspirin -ibuprofen -ketoprofen -naproxen This list may not describe all possible interactions. Give your health care provider a list of all the medicines, herbs, non-prescription drugs, or dietary supplements you use. Also tell them if you smoke, drink alcohol, or use illegal drugs. Some items may interact with your medicine. What should I watch for while using this medicine? Visit your doctor for checks on your progress. This drug may make you feel generally unwell. This is not uncommon, as chemotherapy can affect healthy cells as well as cancer cells. Report any side effects. Continue your course of treatment even though you feel ill unless your doctor tells you to stop. Drink water or other fluids as directed. Urinate often, even at night. In some cases, you may be given additional medicines to help with side effects. Follow all directions for their use. Call your doctor or health care professional for advice if you get a fever, chills or sore throat, or other symptoms of a cold or flu. Do not treat yourself. This drug decreases your body's ability to fight infections. Try to avoid being around people who are sick. This medicine may increase your risk to bruise or bleed. Call your doctor or health care professional if you notice any unusual bleeding. Be careful brushing and flossing your teeth or using a toothpick because you may get an infection or bleed more easily. If you have any dental work done, tell your dentist you are receiving this medicine. Avoid taking products that contain aspirin, acetaminophen, ibuprofen, naproxen,   or ketoprofen unless instructed by your doctor. These medicines may hide a fever. Do not become pregnant while taking this  medicine. Women should inform their doctor if they wish to become pregnant or think they might be pregnant. There is a potential for serious side effects to an unborn child. Talk to your health care professional or pharmacist for more information. Do not breast-feed an infant while taking this medicine. Men should inform their doctor if they wish to father a child. This medicine may lower sperm counts. If you are going to have surgery, tell your doctor or health care professional that you have taken this medicine. What side effects may I notice from receiving this medicine? Side effects that you should report to your doctor or health care professional as soon as possible: -allergic reactions like skin rash, itching or hives, swelling of the face, lips, or tongue -low blood counts - this medicine may decrease the number of white blood cells, red blood cells and platelets. You may be at increased risk for infections and bleeding. -signs of infection - fever or chills, cough, sore throat, pain or difficulty passing urine -signs of decreased platelets or bleeding - bruising, pinpoint red spots on the skin, black, tarry stools, blood in the urine -signs of decreased red blood cells - unusually weak or tired, fainting spells, lightheadedness -breathing problems -dark urine -mouth sores -pain, swelling, redness at site where injected -swelling of the ankles, feet, hands -trouble passing urine or change in the amount of urine -weight gain -yellowing of the eyes or skin Side effects that usually do not require medical attention (report to your doctor or health care professional if they continue or are bothersome): -changes in nail or skin color -diarrhea -hair loss -loss of appetite -missed menstrual periods -nausea, vomiting -stomach pain This list may not describe all possible side effects. Call your doctor for medical advice about side effects. You may report side effects to FDA at  1-800-FDA-1088. Where should I keep my medicine? This drug is given in a hospital or clinic and will not be stored at home. NOTE: This sheet is a summary. It may not cover all possible information. If you have questions about this medicine, talk to your doctor, pharmacist, or health care provider.  2012, [employer]. (11/08/2007 2:32:25 PM)Carfilzomib injection What is this medicine? CARFILZOMIB is a chemotherapy drug that works by slowing or stopping cancer cell growth. This medicine is used to treat multiple myeloma. This medicine may be used for other purposes; ask your health care provider or pharmacist if you have questions. What should I tell my health care provider before I take this medicine? They need to know if you have any of these conditions: -heart disease -irregular heartbeat -liver disease -lung or breathing disease -an unusual or allergic reaction to carfilzomib, or other medicines, foods, dyes, or preservatives -pregnant or trying to get pregnant -breast-feeding How should I use this medicine? This medicine is for injection or infusion into a vein. It is given by a health care professional in a hospital or clinic setting. Talk to your pediatrician regarding the use of this medicine in children. Special care may be needed. Overdosage: If you think you've taken too much of this medicine contact a poison control center or emergency room at once. Overdosage: If you think you have taken too much of this medicine contact a poison control center or emergency room at once. NOTE: This medicine is only for you. Do not share this medicine with others.   What if I miss a dose? It is important not to miss your dose. Call your doctor or health care professional if you are unable to keep an appointment. What may interact with this medicine? Interactions are not expected. Give your health care provider a list of all the medicines, herbs, non-prescription drugs, or dietary  supplements you use. Also tell them if you smoke, drink alcohol, or use illegal drugs. Some items may interact with your medicine. This list may not describe all possible interactions. Give your health care provider a list of all the medicines, herbs, non-prescription drugs, or dietary supplements you use. Also tell them if you smoke, drink alcohol, or use illegal drugs. Some items may interact with your medicine. What should I watch for while using this medicine? Your condition will be monitored carefully while you are receiving this medicine. Report any side effects. Continue your course of treatment even though you feel ill unless your doctor tells you to stop. Call your doctor or health care professional for advice if you get a fever, chills or sore throat, or other symptoms of a cold or flu. Do not treat yourself. Try to avoid being around people who are sick. Do not become pregnant while taking this medicine. Women should inform their doctor if they wish to become pregnant or think they might be pregnant. There is a potential for serious side effects to an unborn child. Talk to your health care professional or pharmacist for more information. Do not breast-feed an infant while taking this medicine. Check with your doctor or health care professional if you get an attack of severe diarrhea, nausea and vomiting, or if you sweat a lot. The loss of too much body fluid can make it dangerous for you to take this medicine. You may get dizzy. Do not drive, use machinery, or do anything that needs mental alertness until you know how this medicine affects you. Do not stand or sit up quickly, especially if you are an older patient. This reduces the risk of dizzy or fainting spells. What side effects may I notice from receiving this medicine? Side effects that you should report to your doctor or health care professional as soon as possible: -allergic reactions like skin rash, itching or hives, swelling of the  face, lips, or tongue -breathing problems -chest pain or palpitationschest tightness -cough -dark urine -dizziness -feeling faint or lightheaded -fever or chills -general ill feeling or flu-like symptoms -light-colored stools -palpitations -right upper belly pain -swelling of the legs or ankles -unusual bleeding or bruising -unusually weak or tired -yellowing of the eyes or skin  Side effects that usually do not require medical attention (Report these to your doctor or health care professional if they continue or are bothersome.): -diarrhea -headache -nausea, vomiting -tiredness This list may not describe all possible side effects. Call your doctor for medical advice about side effects. You may report side effects to FDA at 1-800-FDA-1088. Where should I keep my medicine? This drug is given in a hospital or clinic and will not be stored at home. NOTE: This sheet is a summary. It may not cover all possible information. If you have questions about this medicine, talk to your doctor, pharmacist, or health care provider.  2012, [employer]. (03/12/2011 4:35:08 PM) 

## 2012-06-15 LAB — COMPREHENSIVE METABOLIC PANEL
Alkaline Phosphatase: 60 U/L (ref 39–117)
Creatinine, Ser: 1.45 mg/dL — ABNORMAL HIGH (ref 0.50–1.35)
Glucose, Bld: 157 mg/dL — ABNORMAL HIGH (ref 70–99)
Sodium: 141 mEq/L (ref 135–145)
Total Bilirubin: 0.6 mg/dL (ref 0.3–1.2)
Total Protein: 6 g/dL (ref 6.0–8.3)

## 2012-06-15 LAB — IGG, IGA, IGM
IgG (Immunoglobin G), Serum: 603 mg/dL — ABNORMAL LOW (ref 650–1600)
IgM, Serum: 18 mg/dL — ABNORMAL LOW (ref 41–251)

## 2012-06-15 LAB — PROTEIN ELECTROPHORESIS, SERUM, WITH REFLEX
Albumin ELP: 61.9 % (ref 55.8–66.1)
Beta Globulin: 6.8 % (ref 4.7–7.2)
Total Protein, Serum Electrophoresis: 6 g/dL (ref 6.0–8.3)

## 2012-06-15 LAB — KAPPA/LAMBDA LIGHT CHAINS: Kappa:Lambda Ratio: 0 — ABNORMAL LOW (ref 0.26–1.65)

## 2012-06-20 ENCOUNTER — Ambulatory Visit: Payer: Medicare PPO

## 2012-06-20 ENCOUNTER — Other Ambulatory Visit: Payer: Medicare PPO | Admitting: Lab

## 2012-06-21 ENCOUNTER — Ambulatory Visit: Payer: Medicare PPO

## 2012-06-22 ENCOUNTER — Ambulatory Visit: Payer: Medicare PPO

## 2012-06-22 ENCOUNTER — Ambulatory Visit (INDEPENDENT_AMBULATORY_CARE_PROVIDER_SITE_OTHER): Payer: Medicare PPO | Admitting: Emergency Medicine

## 2012-06-22 VITALS — BP 96/70 | HR 110 | Temp 98.3°F | Resp 16 | Ht 71.5 in | Wt 227.0 lb

## 2012-06-22 DIAGNOSIS — R062 Wheezing: Secondary | ICD-10-CM

## 2012-06-22 DIAGNOSIS — R059 Cough, unspecified: Secondary | ICD-10-CM

## 2012-06-22 DIAGNOSIS — J029 Acute pharyngitis, unspecified: Secondary | ICD-10-CM

## 2012-06-22 DIAGNOSIS — E119 Type 2 diabetes mellitus without complications: Secondary | ICD-10-CM

## 2012-06-22 DIAGNOSIS — R05 Cough: Secondary | ICD-10-CM

## 2012-06-22 DIAGNOSIS — J189 Pneumonia, unspecified organism: Secondary | ICD-10-CM

## 2012-06-22 LAB — POCT CBC
HCT, POC: 31.2 % — AB (ref 43.5–53.7)
Lymph, poc: 0.8 (ref 0.6–3.4)
MCH, POC: 28.6 pg (ref 27–31.2)
MCHC: 30.1 g/dL — AB (ref 31.8–35.4)
MPV: 8.4 fL (ref 0–99.8)
POC Granulocyte: 4 (ref 2–6.9)
POC LYMPH PERCENT: 16 %L (ref 10–50)
POC MID %: 7.8 %M (ref 0–12)
RDW, POC: 19.8 %

## 2012-06-22 LAB — COMPREHENSIVE METABOLIC PANEL
CO2: 25 mEq/L (ref 19–32)
Calcium: 8.8 mg/dL (ref 8.4–10.5)
Chloride: 107 mEq/L (ref 96–112)
Glucose, Bld: 111 mg/dL — ABNORMAL HIGH (ref 70–99)
Sodium: 141 mEq/L (ref 135–145)
Total Bilirubin: 0.4 mg/dL (ref 0.3–1.2)
Total Protein: 6 g/dL (ref 6.0–8.3)

## 2012-06-22 LAB — POCT INFLUENZA A/B: Influenza A, POC: NEGATIVE

## 2012-06-22 MED ORDER — CEFTRIAXONE SODIUM 1 G IJ SOLR
1.0000 g | Freq: Once | INTRAMUSCULAR | Status: AC
  Administered 2012-06-22: 1 g via INTRAMUSCULAR

## 2012-06-22 MED ORDER — CEFDINIR 300 MG PO CAPS: ORAL_CAPSULE | ORAL | Status: DC

## 2012-06-22 NOTE — Progress Notes (Signed)
  Subjective:    Patient ID: Arthur Valdez, male    DOB: 1932/09/12, 78 y.o.   MRN: [id number]  HPI patient enters with a 7 to ten-day history of sore throat. This is not associated with fever patient does have a productive cough producing small amounts of a yellowish type phlegm. He's been taking his neck shows some improvement. He has not been short of breath this feels extremely weak and fatigued. He denies any chest pain. Pertinent history reveals the patient has a history of multiple myeloma. He is status post stem cell transplant at [facility]. He is currently undergoing chemotherapy under the care of Dr. Myna Hidalgo but has skipped his last treatment due to illness.    Review of Systems     Objective:   Physical Exam patient is alert cooperative nontoxic appearing. Neck is supple. Chest exam reveals mild diminished breath sounds in the left base but no rales were heard cardiac exam is regular rate no murmurs extremities are without edema. Results for orders placed in visit on 06/22/12  POCT CBC      Component Value Range   WBC 5.2  4.6 - 10.2 K/uL   Lymph, poc 0.8  0.6 - 3.4   POC LYMPH PERCENT 16.0  10 - 50 %L   MID (cbc) 0.4  0 - 0.9   POC MID % 7.8  0 - 12 %M   POC Granulocyte 4.0  2 - 6.9   Granulocyte percent 76.2  37 - 80 %G   RBC 3.29 (*) 4.69 - 6.13 M/uL   Hemoglobin 9.4 (*) 14.1 - 18.1 g/dL   HCT, POC 78.2 (*) 95.6 - 53.7 %   MCV 94.7  80 - 97 fL   MCH, POC 28.6  27 - 31.2 pg   MCHC 30.1 (*) 31.8 - 35.4 g/dL   RDW, POC 21.3     Platelet Count, POC 198  142 - 424 K/uL   MPV 8.4  0 - 99.8 fL  POCT INFLUENZA A/B      Component Value Range   Influenza A, POC Negative     Influenza B, POC Negative    POCT RAPID STREP A (OFFICE)      Component Value Range   Rapid Strep A Screen Negative  Negative  GLUCOSE, POCT (MANUAL RESULT ENTRY)      Component Value Range   POC Glucose 137 (*) 70 - 99 mg/dl   [facility] reading (PRIMARY) by  Dr.Assyria Morreale there is indwelling line present on  the right. There is an atelectatic area in the left base and the right infrahilar infiltrate.      Assessment & Plan:  Patient is high risk for opportunistic infection. We'll check a CBC strep test flu test and chest x-ray. Also check a sugar.

## 2012-06-25 ENCOUNTER — Ambulatory Visit (INDEPENDENT_AMBULATORY_CARE_PROVIDER_SITE_OTHER): Payer: Medicare PPO | Admitting: Emergency Medicine

## 2012-06-25 VITALS — BP 128/84 | HR 100 | Temp 97.7°F | Resp 17 | Ht 70.0 in | Wt 223.0 lb

## 2012-06-25 DIAGNOSIS — R05 Cough: Secondary | ICD-10-CM

## 2012-06-25 DIAGNOSIS — C9000 Multiple myeloma not having achieved remission: Secondary | ICD-10-CM

## 2012-06-25 DIAGNOSIS — J189 Pneumonia, unspecified organism: Secondary | ICD-10-CM

## 2012-06-25 MED ORDER — HYDROCOD POLST-CHLORPHEN POLST 10-8 MG/5ML PO LQCR: 5.0000 mL | Freq: Every evening | ORAL | Status: DC | PRN

## 2012-06-25 NOTE — Progress Notes (Signed)
  Subjective:    Patient ID: Arthur Valdez, male    DOB: 03/01/1933, 78 y.o.   MRN: [id number]  HPI patient with multiple myeloma enters for followup of a right infrahilar pneumonia. Since receiving a shot of Rocephin and starting on Omnicef he has felt significantly better. He has minimal cough at the present time. He no longer feels short of breath. He is back to his usual self    Review of Systems     Objective:   Physical Exam HEENT exam is unremarkable. His chest is clear. Cardiac exam reveals a regular rate without murmur. Extremities are without edema. Patient is alert conversant and cooperative.        Assessment & Plan:  I've given him Tussionex to have at bedtime for cough. He is to hold off on chemotherapy on Monday and resume chemotherapy a week from Monday. He is to see me in 2 weeks for repeat chest x-ray at that time.

## 2012-06-27 ENCOUNTER — Other Ambulatory Visit: Payer: Medicare PPO | Admitting: Lab

## 2012-06-27 ENCOUNTER — Ambulatory Visit: Payer: Medicare PPO

## 2012-06-28 ENCOUNTER — Ambulatory Visit: Payer: Medicare PPO

## 2012-07-02 ENCOUNTER — Ambulatory Visit (INDEPENDENT_AMBULATORY_CARE_PROVIDER_SITE_OTHER): Payer: Medicare PPO | Admitting: Family Medicine

## 2012-07-02 ENCOUNTER — Encounter: Payer: Self-pay | Admitting: Family Medicine

## 2012-07-02 VITALS — BP 107/72 | HR 96 | Temp 98.6°F | Resp 18 | Ht 71.0 in | Wt 224.0 lb

## 2012-07-02 DIAGNOSIS — N189 Chronic kidney disease, unspecified: Secondary | ICD-10-CM

## 2012-07-02 DIAGNOSIS — J441 Chronic obstructive pulmonary disease with (acute) exacerbation: Secondary | ICD-10-CM

## 2012-07-02 DIAGNOSIS — C9002 Multiple myeloma in relapse: Secondary | ICD-10-CM

## 2012-07-02 DIAGNOSIS — J189 Pneumonia, unspecified organism: Secondary | ICD-10-CM

## 2012-07-02 DIAGNOSIS — D631 Anemia in chronic kidney disease: Secondary | ICD-10-CM

## 2012-07-02 MED ORDER — ALBUTEROL SULFATE HFA 108 (90 BASE) MCG/ACT IN AERS: 2.0000 | INHALATION_SPRAY | RESPIRATORY_TRACT | Status: DC | PRN

## 2012-07-02 NOTE — Patient Instructions (Addendum)
Your clinical exam is good today. I do not feel that any other followup testing is needed at this time. He should be safe for you to resume your chemotherapy on Monday if your oncologist approves. Return if symptoms are worsening at any time.

## 2012-07-02 NOTE — Progress Notes (Signed)
Subjective: 78 year old man with history of not multiple myeloma for 6 years. He's been doing well. He was just treated for a pneumonia, symptoms have resolved. He is breathing well and not coughing. He would like to be cleared to go back to getting his chemotherapy on Monday.  Objective: Healthy-appearing. TMs normal. Throat clear. Neck supple without nodes thyromegaly. Chest is clear to auscultation. Heart regular without murmurs.  Assessment: Pneumonia resolved Multiple myeloma  Plan: Refill his albuterol HFA inhaler Give him a note on his AVS that he can show the doctor stating that he was cleared for resumption of his chemotherapy.

## 2012-07-04 ENCOUNTER — Ambulatory Visit (HOSPITAL_BASED_OUTPATIENT_CLINIC_OR_DEPARTMENT_OTHER): Payer: Medicare PPO | Admitting: Hematology & Oncology

## 2012-07-04 ENCOUNTER — Ambulatory Visit (HOSPITAL_BASED_OUTPATIENT_CLINIC_OR_DEPARTMENT_OTHER): Payer: Medicare PPO

## 2012-07-04 ENCOUNTER — Other Ambulatory Visit (HOSPITAL_BASED_OUTPATIENT_CLINIC_OR_DEPARTMENT_OTHER): Payer: Medicare PPO | Admitting: Lab

## 2012-07-04 ENCOUNTER — Telehealth: Payer: Self-pay | Admitting: Hematology & Oncology

## 2012-07-04 VITALS — BP 122/70 | HR 80 | Temp 97.0°F | Resp 20

## 2012-07-04 DIAGNOSIS — Z5111 Encounter for antineoplastic chemotherapy: Secondary | ICD-10-CM

## 2012-07-04 DIAGNOSIS — C9002 Multiple myeloma in relapse: Secondary | ICD-10-CM

## 2012-07-04 DIAGNOSIS — C9000 Multiple myeloma not having achieved remission: Secondary | ICD-10-CM

## 2012-07-04 DIAGNOSIS — Z452 Encounter for adjustment and management of vascular access device: Secondary | ICD-10-CM

## 2012-07-04 DIAGNOSIS — Z5112 Encounter for antineoplastic immunotherapy: Secondary | ICD-10-CM

## 2012-07-04 LAB — BASIC METABOLIC PANEL - CANCER CENTER ONLY
BUN, Bld: 14 mg/dL (ref 7–22)
CO2: 28 meq/L (ref 18–33)
Calcium: 9 mg/dL (ref 8.0–10.3)
Chloride: 103 meq/L (ref 98–108)
Creat: 1.5 mg/dL — ABNORMAL HIGH (ref 0.6–1.2)
Glucose, Bld: 211 mg/dL — ABNORMAL HIGH (ref 73–118)
Potassium: 4.6 meq/L (ref 3.3–4.7)
Sodium: 143 meq/L (ref 128–145)

## 2012-07-04 LAB — CBC WITH DIFFERENTIAL (CANCER CENTER ONLY)
BASO%: 0.2 % (ref 0.0–2.0)
EOS%: 2.7 % (ref 0.0–7.0)
LYMPH#: 1 10*3/uL (ref 0.9–3.3)
MCH: 30.3 pg (ref 28.0–33.4)
MCHC: 32.7 g/dL (ref 32.0–35.9)
MONO%: 10.9 % (ref 0.0–13.0)
NEUT#: 2.5 10*3/uL (ref 1.5–6.5)
RDW: 16.3 % — ABNORMAL HIGH (ref 11.1–15.7)

## 2012-07-04 MED ORDER — IMMUNE GLOBULIN (HUMAN) 20 GM/200ML IV SOLN
40.0000 g | Freq: Once | INTRAVENOUS | Status: AC
  Administered 2012-07-04: 40 g via INTRAVENOUS
  Filled 2012-07-04: qty 400

## 2012-07-04 MED ORDER — DIPHENHYDRAMINE HCL 25 MG PO CAPS
25.0000 mg | ORAL_CAPSULE | Freq: Once | ORAL | Status: AC
  Administered 2012-07-04: 25 mg via ORAL

## 2012-07-04 MED ORDER — SODIUM CHLORIDE 0.9 % IV SOLN
300.0000 mg/m2 | Freq: Once | INTRAVENOUS | Status: AC
  Administered 2012-07-04: 660 mg via INTRAVENOUS
  Filled 2012-07-04: qty 33

## 2012-07-04 MED ORDER — SODIUM CHLORIDE 0.9 % IJ SOLN
10.0000 mL | INTRAMUSCULAR | Status: DC | PRN
  Administered 2012-07-04: 10 mL
  Filled 2012-07-04: qty 10

## 2012-07-04 MED ORDER — HEPARIN SOD (PORK) LOCK FLUSH 100 UNIT/ML IV SOLN
500.0000 [IU] | Freq: Once | INTRAVENOUS | Status: AC | PRN
  Administered 2012-07-04: 500 [IU]
  Filled 2012-07-04: qty 5

## 2012-07-04 MED ORDER — MEPERIDINE HCL 25 MG/ML IJ SOLN
12.5000 mg | Freq: Once | INTRAMUSCULAR | Status: AC
  Administered 2012-07-04: 12.5 mg via INTRAVENOUS

## 2012-07-04 MED ORDER — ONDANSETRON 8 MG/50ML IVPB (CHCC)
8.0000 mg | Freq: Once | INTRAVENOUS | Status: AC
  Administered 2012-07-04: 8 mg via INTRAVENOUS

## 2012-07-04 MED ORDER — SODIUM CHLORIDE 0.9 % IV SOLN
Freq: Once | INTRAVENOUS | Status: AC
  Administered 2012-07-04: 12:00:00 via INTRAVENOUS

## 2012-07-04 MED ORDER — ACETAMINOPHEN 325 MG PO TABS
650.0000 mg | ORAL_TABLET | Freq: Once | ORAL | Status: AC
  Administered 2012-07-04: 650 mg via ORAL

## 2012-07-04 MED ORDER — ALTEPLASE 2 MG IJ SOLR
2.0000 mg | Freq: Once | INTRAMUSCULAR | Status: AC
  Administered 2012-07-04: 2 mg
  Filled 2012-07-04: qty 2

## 2012-07-04 MED ORDER — DEXTROSE 5 % IV SOLN
27.0000 mg/m2 | Freq: Once | INTRAVENOUS | Status: AC
  Administered 2012-07-04: 60 mg via INTRAVENOUS
  Filled 2012-07-04: qty 30

## 2012-07-04 NOTE — Progress Notes (Signed)
This office note has been dictated.

## 2012-07-04 NOTE — Progress Notes (Signed)
At 1405 pt reported feeling chilled.  BP 131  /97; P 79 Temp 97.7.  IVIG off.  Dr. Myna Hidalgo notified.  Demerol 12.5mg  given IV.  By 1420 pt reports no chills.  1435 IVIG restarted at slower speed.

## 2012-07-04 NOTE — Telephone Encounter (Signed)
Patient called and asked to be sch for treatment today since he was sick and unable to make his treatment apts last week.  Spoke with RN and ok with RN to sch patient for lab and treatment.  Patient is aware of his apt today at 9:30.

## 2012-07-04 NOTE — Patient Instructions (Addendum)
Carfilzomib injection What is this medicine? CARFILZOMIB is a chemotherapy drug that works by slowing or stopping cancer cell growth. This medicine is used to treat multiple myeloma. This medicine may be used for other purposes; ask your health care provider or pharmacist if you have questions. What should I tell my health care provider before I take this medicine? They need to know if you have any of these conditions: -heart disease -irregular heartbeat -liver disease -lung or breathing disease -an unusual or allergic reaction to carfilzomib, or other medicines, foods, dyes, or preservatives -pregnant or trying to get pregnant -breast-feeding How should I use this medicine? This medicine is for injection or infusion into a vein. It is given by a health care professional in a hospital or clinic setting. Talk to your pediatrician regarding the use of this medicine in children. Special care may be needed. Overdosage: If you think you've taken too much of this medicine contact a poison control center or emergency room at once. Overdosage: If you think you have taken too much of this medicine contact a poison control center or emergency room at once. NOTE: This medicine is only for you. Do not share this medicine with others. What if I miss a dose? It is important not to miss your dose. Call your doctor or health care professional if you are unable to keep an appointment. What may interact with this medicine? Interactions are not expected. Give your health care provider a list of all the medicines, herbs, non-prescription drugs, or dietary supplements you use. Also tell them if you smoke, drink alcohol, or use illegal drugs. Some items may interact with your medicine. This list may not describe all possible interactions. Give your health care provider a list of all the medicines, herbs, non-prescription drugs, or dietary supplements you use. Also tell them if you smoke, drink alcohol, or use  illegal drugs. Some items may interact with your medicine. What should I watch for while using this medicine? Your condition will be monitored carefully while you are receiving this medicine. Report any side effects. Continue your course of treatment even though you feel ill unless your doctor tells you to stop. Call your doctor or health care professional for advice if you get a fever, chills or sore throat, or other symptoms of a cold or flu. Do not treat yourself. Try to avoid being around people who are sick. Do not become pregnant while taking this medicine. Women should inform their doctor if they wish to become pregnant or think they might be pregnant. There is a potential for serious side effects to an unborn child. Talk to your health care professional or pharmacist for more information. Do not breast-feed an infant while taking this medicine. Check with your doctor or health care professional if you get an attack of severe diarrhea, nausea and vomiting, or if you sweat a lot. The loss of too much body fluid can make it dangerous for you to take this medicine. You may get dizzy. Do not drive, use machinery, or do anything that needs mental alertness until you know how this medicine affects you. Do not stand or sit up quickly, especially if you are an older patient. This reduces the risk of dizzy or fainting spells. What side effects may I notice from receiving this medicine? Side effects that you should report to your doctor or health care professional as soon as possible: -allergic reactions like skin rash, itching or hives, swelling of the face, lips, or tongue -breathing  problems -chest pain or palpitationschest tightness -cough -dark urine -dizziness -feeling faint or lightheaded -fever or chills -general ill feeling or flu-like symptoms -light-colored stools -palpitations -right upper belly pain -swelling of the legs or ankles -unusual bleeding or bruising -unusually weak or  tired -yellowing of the eyes or skin  Side effects that usually do not require medical attention (Report these to your doctor or health care professional if they continue or are bothersome.): -diarrhea -headache -nausea, vomiting -tiredness This list may not describe all possible side effects. Call your doctor for medical advice about side effects. You may report side effects to FDA at 1-800-FDA-1088. Where should I keep my medicine? This drug is given in a hospital or clinic and will not be stored at home. NOTE: This sheet is a summary. It may not cover all possible information. If you have questions about this medicine, talk to your doctor, pharmacist, or health care provider.  2013, [employer] Standard. (07/23/2011 4:16:30 PM)  Cyclophosphamide injection What is this medicine? CYCLOPHOSPHAMIDE (sye kloe FOSS fa mide) is a chemotherapy drug. It slows the growth of cancer cells. This medicine is used to treat many types of cancer like lymphoma, myeloma, leukemia, breast cancer, and ovarian cancer, to name a few. It is also used to treat nephrotic syndrome in children. This medicine may be used for other purposes; ask your health care provider or pharmacist if you have questions. What should I tell my health care provider before I take this medicine? They need to know if you have any of these conditions: -blood disorders -history of other chemotherapy -history of radiation therapy -infection -kidney disease -liver disease -tumors in the bone marrow -an unusual or allergic reaction to cyclophosphamide, other chemotherapy, other medicines, foods, dyes, or preservatives -pregnant or trying to get pregnant -breast-feeding How should I use this medicine? This drug is usually given as an injection into a vein or muscle or by infusion into a vein. It is administered in a hospital or clinic by a specially trained health care professional. Talk to your pediatrician regarding the use of this  medicine in children. While this drug may be prescribed for selected conditions, precautions do apply. Overdosage: If you think you have taken too much of this medicine contact a poison control center or emergency room at once. NOTE: This medicine is only for you. Do not share this medicine with others. What if I miss a dose? It is important not to miss your dose. Call your doctor or health care professional if you are unable to keep an appointment. What may interact with this medicine? Do not take this medicine with any of the following medications: -mibefradil -nalidixic acid This medicine may also interact with the following medications: -doxorubicin -etanercept -medicines to increase blood counts like filgrastim, pegfilgrastim, sargramostim -medicines that block muscle or nerve pain -St. John's Wort -phenobarbital -succinylcholine chloride -trastuzumab -vaccines Talk to your doctor or health care professional before taking any of these medicines: -acetaminophen -aspirin -ibuprofen -ketoprofen -naproxen This list may not describe all possible interactions. Give your health care provider a list of all the medicines, herbs, non-prescription drugs, or dietary supplements you use. Also tell them if you smoke, drink alcohol, or use illegal drugs. Some items may interact with your medicine. What should I watch for while using this medicine? Visit your doctor for checks on your progress. This drug may make you feel generally unwell. This is not uncommon, as chemotherapy can affect healthy cells as well as cancer cells. Report any   effects. Continue your course of treatment even though you feel ill unless your doctor tells you to stop. Drink water or other fluids as directed. Urinate often, even at night. In some cases, you may be given additional medicines to help with side effects. Follow all directions for their use. Call your doctor or health care professional for advice if you get a  fever, chills or sore throat, or other symptoms of a cold or flu. Do not treat yourself. This drug decreases your body's ability to fight infections. Try to avoid being around people who are sick. This medicine may increase your risk to bruise or bleed. Call your doctor or health care professional if you notice any unusual bleeding. Be careful brushing and flossing your teeth or using a toothpick because you may get an infection or bleed more easily. If you have any dental work done, tell your dentist you are receiving this medicine. Avoid taking products that contain aspirin, acetaminophen, ibuprofen, naproxen, or ketoprofen unless instructed by your doctor. These medicines may hide a fever. Do not become pregnant while taking this medicine. Women should inform their doctor if they wish to become pregnant or think they might be pregnant. There is a potential for serious side effects to an unborn child. Talk to your health care professional or pharmacist for more information. Do not breast-feed an infant while taking this medicine. Men should inform their doctor if they wish to father a child. This medicine may lower sperm counts. If you are going to have surgery, tell your doctor or health care professional that you have taken this medicine. What side effects may I notice from receiving this medicine? Side effects that you should report to your doctor or health care professional as soon as possible: -allergic reactions like skin rash, itching or hives, swelling of the face, lips, or tongue -low blood counts - this medicine may decrease the number of white blood cells, red blood cells and platelets. You may be at increased risk for infections and bleeding. -signs of infection - fever or chills, cough, sore throat, pain or difficulty passing urine -signs of decreased platelets or bleeding - bruising, pinpoint red spots on the skin, black, tarry stools, blood in the urine -signs of decreased red blood  cells - unusually weak or tired, fainting spells, lightheadedness -breathing problems -dark urine -mouth sores -pain, swelling, redness at site where injected -swelling of the ankles, feet, hands -trouble passing urine or change in the amount of urine -weight gain -yellowing of the eyes or skin Side effects that usually do not require medical attention (report to your doctor or health care professional if they continue or are bothersome): -changes in nail or skin color -diarrhea -hair loss -loss of appetite -missed menstrual periods -nausea, vomiting -stomach pain This list may not describe all possible side effects. Call your doctor for medical advice about side effects. You may report side effects to FDA at 1-800-FDA-1088. Where should I keep my medicine? This drug is given in a hospital or clinic and will not be stored at home. NOTE: This sheet is a summary. It may not cover all possible information. If you have questions about this medicine, talk to your doctor, pharmacist, or health care provider.  2013, [employer] Standard. (11/08/2007 2:32:25 PM) Immune Globulin Injection What is this medicine? IMMUNE GLOBULIN (im MUNE GLOB yoo lin) helps to prevent or reduce the severity of certain infections in patients who are at risk. This medicine is collected from the pooled blood of  many donors. It is used to treat immune system problems, thrombocytopenia, and Kawasaki syndrome. This medicine may be used for other purposes; ask your health care provider or pharmacist if you have questions. What should I tell my health care provider before I take this medicine? They need to know if you have any of these conditions: - diabetes - extremely low or no immune antibodies in the blood - heart disease - history of blood clots - hyperprolinemia - infection in the blood, sepsis - kidney disease - taking medicine that may change kidney function - ask your health care provider about  your medicine - an unusual or allergic reaction to human immune globulin, albumin, maltose, sucrose, polysorbate 80, other medicines, foods, dyes, or preservatives - pregnant or trying to get pregnant - breast-feeding How should I use this medicine? This medicine is for injection into a muscle or infusion into a vein or skin. It is usually given by a health care professional in a hospital or clinic setting. In rare cases, some brands of this medicine might be given at home. You will be taught how to give this medicine. Use exactly as directed. Take your medicine at regular intervals. Do not take your medicine more often than directed. Talk to your pediatrician regarding the use of this medicine in children. Special care may be needed. Overdosage: If you think you have taken too much of this medicine contact a poison control center or emergency room at once. NOTE: This medicine is only for you. Do not share this medicine with others. What if I miss a dose? It is important not to miss your dose. Call your doctor or health care professional if you are unable to keep an appointment. If you give yourself the medicine and you miss a dose, take it as soon as you can. If it is almost time for your next dose, take only that dose. Do not take double or extra doses. What may interact with this medicine? -aspirin and aspirin-like medicines -cisplatin -cyclosporine -medicines for infection like acyclovir, adefovir, amphotericin B, bacitracin, cidofovir, foscarnet, ganciclovir, gentamicin, pentamidine, vancomycin -NSAIDS, medicines for pain and inflammation, like ibuprofen or naproxen -pamidronate -vaccines -zoledronic acid This list may not describe all possible interactions. Give your health care provider a list of all the medicines, herbs, non-prescription drugs, or dietary supplements you use. Also tell them if you smoke, drink alcohol, or use illegal drugs. Some items may interact with your  medicine. What should I watch for while using this medicine? Your condition will be monitored carefully while you are receiving this medicine. This medicine is made from pooled blood donations of many different people. It may be possible to pass an infection in this medicine. However, the donors are screened for infections and all products are tested for HIV and hepatitis. The medicine is treated to kill most or all bacteria and viruses. Talk to your doctor about the risks and benefits of this medicine. Do not have vaccinations for at least 14 days before, or until at least 3 months after receiving this medicine. What side effects may I notice from receiving this medicine? Side effects that you should report to your doctor or health care professional as soon as possible: -allergic reactions like skin rash, itching or hives, swelling of the face, lips, or tongue -breathing problems -chest pain or tightness -fever, chills -headache with nausea, vomiting -neck pain or difficulty moving neck -pain when moving eyes -pain, swelling, warmth in the leg -problems with balance, talking, walking -  sudden weight gain -swelling of the ankles, feet, hands -trouble passing urine or change in the amount of urine Side effects that usually do not require medical attention (report to your doctor or health care professional if they continue or are bothersome): -dizzy, drowsy -flushing -increased sweating -leg cramps -muscle aches and pains -pain at site where injected This list may not describe all possible side effects. Call your doctor for medical advice about side effects. You may report side effects to FDA at 1-800-FDA-1088. Where should I keep my medicine? Keep out of the reach of children. This drug is usually given in a hospital or clinic and will not be stored at home. In rare cases, some brands of this medicine may be given at home. If you are using this medicine at home, you will be instructed on  how to store this medicine. Throw away any unused medicine after the expiration date on the label. NOTE: This sheet is a summary. It may not cover all possible information. If you have questions about this medicine, talk to your doctor, pharmacist, or health care provider.  2012, [employer] Standard. (10/24/2008 11:44:49 AM)

## 2012-07-05 ENCOUNTER — Ambulatory Visit (HOSPITAL_BASED_OUTPATIENT_CLINIC_OR_DEPARTMENT_OTHER): Payer: Medicare PPO

## 2012-07-05 VITALS — BP 111/68 | HR 107 | Temp 99.0°F | Resp 18

## 2012-07-05 DIAGNOSIS — Z5112 Encounter for antineoplastic immunotherapy: Secondary | ICD-10-CM

## 2012-07-05 DIAGNOSIS — C9002 Multiple myeloma in relapse: Secondary | ICD-10-CM

## 2012-07-05 MED ORDER — SODIUM CHLORIDE 0.9 % IJ SOLN
10.0000 mL | INTRAMUSCULAR | Status: DC | PRN
  Filled 2012-07-05: qty 10

## 2012-07-05 MED ORDER — SODIUM CHLORIDE 0.9 % IV SOLN
Freq: Once | INTRAVENOUS | Status: AC
  Administered 2012-07-05: 12:00:00 via INTRAVENOUS

## 2012-07-05 MED ORDER — HEPARIN SOD (PORK) LOCK FLUSH 100 UNIT/ML IV SOLN
500.0000 [IU] | Freq: Once | INTRAVENOUS | Status: DC | PRN
  Filled 2012-07-05: qty 5

## 2012-07-05 MED ORDER — DEXTROSE 5 % IV SOLN
27.0000 mg/m2 | Freq: Once | INTRAVENOUS | Status: AC
  Administered 2012-07-05: 60 mg via INTRAVENOUS
  Filled 2012-07-05: qty 30

## 2012-07-05 MED ORDER — ONDANSETRON 8 MG/50ML IVPB (CHCC)
8.0000 mg | Freq: Once | INTRAVENOUS | Status: AC
  Administered 2012-07-05: 8 mg via INTRAVENOUS

## 2012-07-05 NOTE — Progress Notes (Signed)
CC:   Arthur Valdez, M.D.  DIAGNOSES: 1. Recurrent lambda light chain myeloma. 2. Anemia secondary to renal insufficiency.  CURRENT THERAPY: 1. The patient is status post 1-1/2 cycles of Kyprolis/Cytoxan. 2. Zometa monthly.  INTERIM HISTORY:  Arthur Valdez comes in for followup.  Unfortunately, he did not complete his second cycle of chemotherapy because of pneumonia. He was not hospitalized.  He was placed on IV antibiotics.  I think that he probably does require IVIG.  I just have a sense that his immune system is low because of the chemo and because of his myeloma.  He is feeling better.  His blood sugars have been running on the higher side, but he has been proactive in controlling these.  He is not complaining of any pain.  He has had no problem with bowels or bladder.  He has had no leg swelling.  He has had no mouth sores.  His last lambda light chain was up a little bit at 168.  Hopefully, we can get this back down now that we have him back on treatment.  PHYSICAL EXAMINATION:  General:  This is a well-developed, well- nourished black gentleman in no obvious distress.  Vital signs: Temperature of 96, pulse 18, temperature 97.1, pulse 96, respiratory rate 18, blood pressure 127/80.  His weight was 224.  Head and neck: Normocephalic, atraumatic skull.  There are no ocular or oral lesions. There are no palpable cervical or supraclavicular lymph nodes.  Lungs: Clear bilaterally.  No rales, wheezes, or rhonchi are noted.  Cardiac: Regular rate and rhythm with a normal S1 and S2.  There are no murmurs, rubs or bruits.  Abdomen:  Soft with good bowel sounds.  There is no palpable abdominal mass.  No palpable hepatosplenomegaly is noted. Extremities:  No clubbing, cyanosis or edema.  Neurological:  No focal neurological deficits.  LABORATORY STUDIES:  White cell count 4.1, hemoglobin 9.9, hematocrit 30.9, platelet count 218.  Sodium 141, potassium 4.6, BUN 14,  creatinine 1.5.  IMPRESSION:  Arthur Valdez is a 78 year old gentleman with refractory lambda light chain myeloma.  Again, he responded initially to treatment.  We then had to unfortunately hold some of his treatment because of this "pneumonia."  I think we have got to just try to get him through this third cycle.  If we can complete this cycle with all treatments being given, then I think I will have a better idea as to where we stand with his myeloma.  Move ahead with his treatment today.  He will get his IVIG.  I think he is probably due for his Zometa in a couple weeks.  Will have him come back to see [place] in 3 weeks.    ______________________________ [name], M.D. PRE/MEDQ  D:  07/04/2012  T:  07/05/2012  Job:  [id number]

## 2012-07-05 NOTE — Patient Instructions (Signed)
Carfilzomib injection What is this medicine? CARFILZOMIB is a chemotherapy drug that works by slowing or stopping cancer cell growth. This medicine is used to treat multiple myeloma. This medicine may be used for other purposes; ask your health care provider or pharmacist if you have questions. What should I tell my health care provider before I take this medicine? They need to know if you have any of these conditions: -heart disease -irregular heartbeat -liver disease -lung or breathing disease -an unusual or allergic reaction to carfilzomib, or other medicines, foods, dyes, or preservatives -pregnant or trying to get pregnant -breast-feeding How should I use this medicine? This medicine is for injection or infusion into a vein. It is given by a health care professional in a hospital or clinic setting. Talk to your pediatrician regarding the use of this medicine in children. Special care may be needed. Overdosage: If you think you've taken too much of this medicine contact a poison control center or emergency room at once. Overdosage: If you think you have taken too much of this medicine contact a poison control center or emergency room at once. NOTE: This medicine is only for you. Do not share this medicine with others. What if I miss a dose? It is important not to miss your dose. Call your doctor or health care professional if you are unable to keep an appointment. What may interact with this medicine? Interactions are not expected. Give your health care provider a list of all the medicines, herbs, non-prescription drugs, or dietary supplements you use. Also tell them if you smoke, drink alcohol, or use illegal drugs. Some items may interact with your medicine. This list may not describe all possible interactions. Give your health care provider a list of all the medicines, herbs, non-prescription drugs, or dietary supplements you use. Also tell them if you smoke, drink alcohol, or use  illegal drugs. Some items may interact with your medicine. What should I watch for while using this medicine? Your condition will be monitored carefully while you are receiving this medicine. Report any side effects. Continue your course of treatment even though you feel ill unless your doctor tells you to stop. Call your doctor or health care professional for advice if you get a fever, chills or sore throat, or other symptoms of a cold or flu. Do not treat yourself. Try to avoid being around people who are sick. Do not become pregnant while taking this medicine. Women should inform their doctor if they wish to become pregnant or think they might be pregnant. There is a potential for serious side effects to an unborn child. Talk to your health care professional or pharmacist for more information. Do not breast-feed an infant while taking this medicine. Check with your doctor or health care professional if you get an attack of severe diarrhea, nausea and vomiting, or if you sweat a lot. The loss of too much body fluid can make it dangerous for you to take this medicine. You may get dizzy. Do not drive, use machinery, or do anything that needs mental alertness until you know how this medicine affects you. Do not stand or sit up quickly, especially if you are an older patient. This reduces the risk of dizzy or fainting spells. What side effects may I notice from receiving this medicine? Side effects that you should report to your doctor or health care professional as soon as possible: -allergic reactions like skin rash, itching or hives, swelling of the face, lips, or tongue -breathing   problems -chest pain or palpitationschest tightness -cough -dark urine -dizziness -feeling faint or lightheaded -fever or chills -general ill feeling or flu-like symptoms -light-colored stools -palpitations -right upper belly pain -swelling of the legs or ankles -unusual bleeding or bruising -unusually weak or  tired -yellowing of the eyes or skin  Side effects that usually do not require medical attention (Report these to your doctor or health care professional if they continue or are bothersome.): -diarrhea -headache -nausea, vomiting -tiredness This list may not describe all possible side effects. Call your doctor for medical advice about side effects. You may report side effects to FDA at 1-800-FDA-1088. Where should I keep my medicine? This drug is given in a hospital or clinic and will not be stored at home. NOTE: This sheet is a summary. It may not cover all possible information. If you have questions about this medicine, talk to your doctor, pharmacist, or health care provider.  2012, Elsevier/Gold Standard. (03/12/2011 4:35:08 PM) 

## 2012-07-11 ENCOUNTER — Other Ambulatory Visit (HOSPITAL_BASED_OUTPATIENT_CLINIC_OR_DEPARTMENT_OTHER): Payer: Medicare PPO | Admitting: Lab

## 2012-07-11 ENCOUNTER — Ambulatory Visit (HOSPITAL_BASED_OUTPATIENT_CLINIC_OR_DEPARTMENT_OTHER): Payer: Medicare PPO | Admitting: Hematology & Oncology

## 2012-07-11 ENCOUNTER — Ambulatory Visit (HOSPITAL_BASED_OUTPATIENT_CLINIC_OR_DEPARTMENT_OTHER): Payer: Medicare PPO

## 2012-07-11 VITALS — BP 113/54 | HR 96 | Temp 97.8°F | Resp 18 | Ht 73.0 in | Wt 227.0 lb

## 2012-07-11 DIAGNOSIS — C9002 Multiple myeloma in relapse: Secondary | ICD-10-CM

## 2012-07-11 DIAGNOSIS — Z5111 Encounter for antineoplastic chemotherapy: Secondary | ICD-10-CM

## 2012-07-11 DIAGNOSIS — C9000 Multiple myeloma not having achieved remission: Secondary | ICD-10-CM

## 2012-07-11 DIAGNOSIS — N289 Disorder of kidney and ureter, unspecified: Secondary | ICD-10-CM

## 2012-07-11 DIAGNOSIS — D649 Anemia, unspecified: Secondary | ICD-10-CM

## 2012-07-11 LAB — CBC WITH DIFFERENTIAL (CANCER CENTER ONLY)
BASO#: 0 10*3/uL (ref 0.0–0.2)
EOS%: 1.8 % (ref 0.0–7.0)
HGB: 10 g/dL — ABNORMAL LOW (ref 13.0–17.1)
MCH: 30.7 pg (ref 28.0–33.4)
MCHC: 33.2 g/dL (ref 32.0–35.9)
MONO%: 10.9 % (ref 0.0–13.0)
NEUT#: 5 10*3/uL (ref 1.5–6.5)
RDW: 16.2 % — ABNORMAL HIGH (ref 11.1–15.7)

## 2012-07-11 LAB — BASIC METABOLIC PANEL - CANCER CENTER ONLY
BUN, Bld: 19 mg/dL (ref 7–22)
CO2: 28 mEq/L (ref 18–33)
Chloride: 103 mEq/L (ref 98–108)
Creat: 1.5 mg/dl — ABNORMAL HIGH (ref 0.6–1.2)
Glucose, Bld: 150 mg/dL — ABNORMAL HIGH (ref 73–118)

## 2012-07-11 MED ORDER — SODIUM CHLORIDE 0.9 % IV SOLN
Freq: Once | INTRAVENOUS | Status: AC
  Administered 2012-07-11: 10:00:00 via INTRAVENOUS

## 2012-07-11 MED ORDER — HEPARIN SOD (PORK) LOCK FLUSH 100 UNIT/ML IV SOLN
500.0000 [IU] | Freq: Once | INTRAVENOUS | Status: AC | PRN
  Administered 2012-07-11: 500 [IU]
  Filled 2012-07-11: qty 5

## 2012-07-11 MED ORDER — SODIUM CHLORIDE 0.9 % IJ SOLN
10.0000 mL | INTRAMUSCULAR | Status: DC | PRN
  Administered 2012-07-11: 10 mL
  Filled 2012-07-11: qty 10

## 2012-07-11 MED ORDER — DEXTROSE 5 % IV SOLN
27.0000 mg/m2 | Freq: Once | INTRAVENOUS | Status: AC
  Administered 2012-07-11: 60 mg via INTRAVENOUS
  Filled 2012-07-11: qty 30

## 2012-07-11 MED ORDER — ONDANSETRON 8 MG/50ML IVPB (CHCC)
8.0000 mg | Freq: Once | INTRAVENOUS | Status: AC
  Administered 2012-07-11: 8 mg via INTRAVENOUS

## 2012-07-11 MED ORDER — SODIUM CHLORIDE 0.9 % IV SOLN
300.0000 mg/m2 | Freq: Once | INTRAVENOUS | Status: AC
  Administered 2012-07-11: 660 mg via INTRAVENOUS
  Filled 2012-07-11: qty 33

## 2012-07-11 NOTE — Patient Instructions (Signed)
Carfilzomib injection What is this medicine? CARFILZOMIB is a chemotherapy drug that works by slowing or stopping cancer cell growth. This medicine is used to treat multiple myeloma. This medicine may be used for other purposes; ask your health care provider or pharmacist if you have questions. What should I tell my health care provider before I take this medicine? They need to know if you have any of these conditions: -heart disease -irregular heartbeat -liver disease -lung or breathing disease -an unusual or allergic reaction to carfilzomib, or other medicines, foods, dyes, or preservatives -pregnant or trying to get pregnant -breast-feeding How should I use this medicine? This medicine is for injection or infusion into a vein. It is given by a health care professional in a hospital or clinic setting. Talk to your pediatrician regarding the use of this medicine in children. Special care may be needed. Overdosage: If you think you've taken too much of this medicine contact a poison control center or emergency room at once. Overdosage: If you think you have taken too much of this medicine contact a poison control center or emergency room at once. NOTE: This medicine is only for you. Do not share this medicine with others. What if I miss a dose? It is important not to miss your dose. Call your doctor or health care professional if you are unable to keep an appointment. What may interact with this medicine? Interactions are not expected. Give your health care provider a list of all the medicines, herbs, non-prescription drugs, or dietary supplements you use. Also tell them if you smoke, drink alcohol, or use illegal drugs. Some items may interact with your medicine. This list may not describe all possible interactions. Give your health care provider a list of all the medicines, herbs, non-prescription drugs, or dietary supplements you use. Also tell them if you smoke, drink alcohol, or use  illegal drugs. Some items may interact with your medicine. What should I watch for while using this medicine? Your condition will be monitored carefully while you are receiving this medicine. Report any side effects. Continue your course of treatment even though you feel ill unless your doctor tells you to stop. Call your doctor or health care professional for advice if you get a fever, chills or sore throat, or other symptoms of a cold or flu. Do not treat yourself. Try to avoid being around people who are sick. Do not become pregnant while taking this medicine. Women should inform their doctor if they wish to become pregnant or think they might be pregnant. There is a potential for serious side effects to an unborn child. Talk to your health care professional or pharmacist for more information. Do not breast-feed an infant while taking this medicine. Check with your doctor or health care professional if you get an attack of severe diarrhea, nausea and vomiting, or if you sweat a lot. The loss of too much body fluid can make it dangerous for you to take this medicine. You may get dizzy. Do not drive, use machinery, or do anything that needs mental alertness until you know how this medicine affects you. Do not stand or sit up quickly, especially if you are an older patient. This reduces the risk of dizzy or fainting spells. What side effects may I notice from receiving this medicine? Side effects that you should report to your doctor or health care professional as soon as possible: -allergic reactions like skin rash, itching or hives, swelling of the face, lips, or tongue -breathing   problems -chest pain or palpitationschest tightness -cough -dark urine -dizziness -feeling faint or lightheaded -fever or chills -general ill feeling or flu-like symptoms -light-colored stools -palpitations -right upper belly pain -swelling of the legs or ankles -unusual bleeding or bruising -unusually weak or  tired -yellowing of the eyes or skin  Side effects that usually do not require medical attention (Report these to your doctor or health care professional if they continue or are bothersome.): -diarrhea -headache -nausea, vomiting -tiredness This list may not describe all possible side effects. Call your doctor for medical advice about side effects. You may report side effects to FDA at 1-800-FDA-1088. Where should I keep my medicine? This drug is given in a hospital or clinic and will not be stored at home. NOTE: This sheet is a summary. It may not cover all possible information. If you have questions about this medicine, talk to your doctor, pharmacist, or health care provider.  2013, [employer]. (07/23/2011 4:16:30 PM)  Cyclophosphamide injection What is this medicine? CYCLOPHOSPHAMIDE (sye kloe FOSS fa mide) is a chemotherapy drug. It slows the growth of cancer cells. This medicine is used to treat many types of cancer like lymphoma, myeloma, leukemia, breast cancer, and ovarian cancer, to name a few. It is also used to treat nephrotic syndrome in children. This medicine may be used for other purposes; ask your health care provider or pharmacist if you have questions. What should I tell my health care provider before I take this medicine? They need to know if you have any of these conditions: -blood disorders -history of other chemotherapy -history of radiation therapy -infection -kidney disease -liver disease -tumors in the bone marrow -an unusual or allergic reaction to cyclophosphamide, other chemotherapy, other medicines, foods, dyes, or preservatives -pregnant or trying to get pregnant -breast-feeding How should I use this medicine? This drug is usually given as an injection into a vein or muscle or by infusion into a vein. It is administered in a hospital or clinic by a specially trained health care professional. Talk to your pediatrician regarding the use of this  medicine in children. While this drug may be prescribed for selected conditions, precautions do apply. Overdosage: If you think you have taken too much of this medicine contact a poison control center or emergency room at once. NOTE: This medicine is only for you. Do not share this medicine with others. What if I miss a dose? It is important not to miss your dose. Call your doctor or health care professional if you are unable to keep an appointment. What may interact with this medicine? Do not take this medicine with any of the following medications: -mibefradil -nalidixic acid This medicine may also interact with the following medications: -doxorubicin -etanercept -medicines to increase blood counts like filgrastim, pegfilgrastim, sargramostim -medicines that block muscle or nerve pain -St. John's Wort -phenobarbital -succinylcholine chloride -trastuzumab -vaccines Talk to your doctor or health care professional before taking any of these medicines: -acetaminophen -aspirin -ibuprofen -ketoprofen -naproxen This list may not describe all possible interactions. Give your health care provider a list of all the medicines, herbs, non-prescription drugs, or dietary supplements you use. Also tell them if you smoke, drink alcohol, or use illegal drugs. Some items may interact with your medicine. What should I watch for while using this medicine? Visit your doctor for checks on your progress. This drug may make you feel generally unwell. This is not uncommon, as chemotherapy can affect healthy cells as well as cancer cells. Report any   side effects. Continue your course of treatment even though you feel ill unless your doctor tells you to stop. Drink water or other fluids as directed. Urinate often, even at night. In some cases, you may be given additional medicines to help with side effects. Follow all directions for their use. Call your doctor or health care professional for advice if you get a  fever, chills or sore throat, or other symptoms of a cold or flu. Do not treat yourself. This drug decreases your body's ability to fight infections. Try to avoid being around people who are sick. This medicine may increase your risk to bruise or bleed. Call your doctor or health care professional if you notice any unusual bleeding. Be careful brushing and flossing your teeth or using a toothpick because you may get an infection or bleed more easily. If you have any dental work done, tell your dentist you are receiving this medicine. Avoid taking products that contain aspirin, acetaminophen, ibuprofen, naproxen, or ketoprofen unless instructed by your doctor. These medicines may hide a fever. Do not become pregnant while taking this medicine. Women should inform their doctor if they wish to become pregnant or think they might be pregnant. There is a potential for serious side effects to an unborn child. Talk to your health care professional or pharmacist for more information. Do not breast-feed an infant while taking this medicine. Men should inform their doctor if they wish to father a child. This medicine may lower sperm counts. If you are going to have surgery, tell your doctor or health care professional that you have taken this medicine. What side effects may I notice from receiving this medicine? Side effects that you should report to your doctor or health care professional as soon as possible: -allergic reactions like skin rash, itching or hives, swelling of the face, lips, or tongue -low blood counts - this medicine may decrease the number of white blood cells, red blood cells and platelets. You may be at increased risk for infections and bleeding. -signs of infection - fever or chills, cough, sore throat, pain or difficulty passing urine -signs of decreased platelets or bleeding - bruising, pinpoint red spots on the skin, black, tarry stools, blood in the urine -signs of decreased red blood  cells - unusually weak or tired, fainting spells, lightheadedness -breathing problems -dark urine -mouth sores -pain, swelling, redness at site where injected -swelling of the ankles, feet, hands -trouble passing urine or change in the amount of urine -weight gain -yellowing of the eyes or skin Side effects that usually do not require medical attention (report to your doctor or health care professional if they continue or are bothersome): -changes in nail or skin color -diarrhea -hair loss -loss of appetite -missed menstrual periods -nausea, vomiting -stomach pain This list may not describe all possible side effects. Call your doctor for medical advice about side effects. You may report side effects to FDA at 1-800-FDA-1088. Where should I keep my medicine? This drug is given in a hospital or clinic and will not be stored at home. NOTE: This sheet is a summary. It may not cover all possible information. If you have questions about this medicine, talk to your doctor, pharmacist, or health care provider.  2013, [employer] Standard. (11/08/2007 2:32:25 PM)   

## 2012-07-11 NOTE — Progress Notes (Signed)
This office note has been dictated.

## 2012-07-12 ENCOUNTER — Ambulatory Visit (HOSPITAL_BASED_OUTPATIENT_CLINIC_OR_DEPARTMENT_OTHER): Payer: Medicare PPO

## 2012-07-12 VITALS — BP 147/84 | HR 82 | Temp 97.5°F | Resp 20

## 2012-07-12 DIAGNOSIS — C9002 Multiple myeloma in relapse: Secondary | ICD-10-CM

## 2012-07-12 DIAGNOSIS — Z5112 Encounter for antineoplastic immunotherapy: Secondary | ICD-10-CM

## 2012-07-12 MED ORDER — ZOLEDRONIC ACID 4 MG/5ML IV CONC
3.5000 mg | Freq: Once | INTRAVENOUS | Status: AC
  Administered 2012-07-12: 3.5 mg via INTRAVENOUS
  Filled 2012-07-12: qty 4.38

## 2012-07-12 MED ORDER — ONDANSETRON 8 MG/50ML IVPB (CHCC)
8.0000 mg | Freq: Once | INTRAVENOUS | Status: AC
  Administered 2012-07-12: 8 mg via INTRAVENOUS

## 2012-07-12 MED ORDER — SODIUM CHLORIDE 0.9 % IJ SOLN
10.0000 mL | INTRAMUSCULAR | Status: DC | PRN
  Administered 2012-07-12: 10 mL
  Filled 2012-07-12: qty 10

## 2012-07-12 MED ORDER — SODIUM CHLORIDE 0.9 % IV SOLN
Freq: Once | INTRAVENOUS | Status: AC
  Administered 2012-07-12: 12:00:00 via INTRAVENOUS

## 2012-07-12 MED ORDER — SODIUM CHLORIDE 0.9 % IV SOLN: Freq: Once | INTRAVENOUS | Status: DC

## 2012-07-12 MED ORDER — DEXTROSE 5 % IV SOLN
27.0000 mg/m2 | Freq: Once | INTRAVENOUS | Status: AC
  Administered 2012-07-12: 60 mg via INTRAVENOUS
  Filled 2012-07-12: qty 30

## 2012-07-12 MED ORDER — HEPARIN SOD (PORK) LOCK FLUSH 100 UNIT/ML IV SOLN
500.0000 [IU] | Freq: Once | INTRAVENOUS | Status: AC | PRN
  Administered 2012-07-12: 500 [IU]
  Filled 2012-07-12: qty 5

## 2012-07-12 NOTE — Patient Instructions (Signed)
Zoledronic Acid injection (Hypercalcemia, Oncology) What is this medicine? ZOLEDRONIC ACID (ZOE le dron ik AS id) lowers the amount of calcium loss from bone. It is used to treat too much calcium in your blood from cancer. It is also used to prevent complications of cancer that has spread to the bone. This medicine may be used for other purposes; ask your health care provider or pharmacist if you have questions. What should I tell my health care provider before I take this medicine? They need to know if you have any of these conditions: -aspirin-sensitive asthma -dental disease -kidney disease -an unusual or allergic reaction to zoledronic acid, other medicines, foods, dyes, or preservatives -pregnant or trying to get pregnant -breast-feeding How should I use this medicine? This medicine is for infusion into a vein. It is given by a health care professional in a hospital or clinic setting. Talk to your pediatrician regarding the use of this medicine in children. Special care may be needed. Overdosage: If you think you have taken too much of this medicine contact a poison control center or emergency room at once. NOTE: This medicine is only for you. Do not share this medicine with others. What if I miss a dose? It is important not to miss your dose. Call your doctor or health care professional if you are unable to keep an appointment. What may interact with this medicine? -certain antibiotics given by injection -NSAIDs, medicines for pain and inflammation, like ibuprofen or naproxen -some diuretics like bumetanide, furosemide -teriparatide -thalidomide This list may not describe all possible interactions. Give your health care provider a list of all the medicines, herbs, non-prescription drugs, or dietary supplements you use. Also tell them if you smoke, drink alcohol, or use illegal drugs. Some items may interact with your medicine. What should I watch for while using this medicine? Visit  your doctor or health care professional for regular checkups. It may be some time before you see the benefit from this medicine. Do not stop taking your medicine unless your doctor tells you to. Your doctor may order blood tests or other tests to see how you are doing. Women should inform their doctor if they wish to become pregnant or think they might be pregnant. There is a potential for serious side effects to an unborn child. Talk to your health care professional or pharmacist for more information. You should make sure that you get enough calcium and vitamin D while you are taking this medicine. Discuss the foods you eat and the vitamins you take with your health care professional. Some people who take this medicine have severe bone, joint, and/or muscle pain. This medicine may also increase your risk for a broken thigh bone. Tell your doctor right away if you have pain in your upper leg or groin. Tell your doctor if you have any pain that does not go away or that gets worse. What side effects may I notice from receiving this medicine? Side effects that you should report to your doctor or health care professional as soon as possible: -allergic reactions like skin rash, itching or hives, swelling of the face, lips, or tongue -anxiety, confusion, or depression -breathing problems -changes in vision -feeling faint or lightheaded, falls -jaw burning, cramping, pain -muscle cramps, stiffness, or weakness -trouble passing urine or change in the amount of urine Side effects that usually do not require medical attention (report to your doctor or health care professional if they continue or are bothersome): -bone, joint, or muscle pain -  fever -hair loss -irritation at site where injected -loss of appetite -nausea, vomiting -stomach upset -tired This list may not describe all possible side effects. Call your doctor for medical advice about side effects. You may report side effects to FDA at  1-800-FDA-1088. Where should I keep my medicine? This drug is given in a hospital or clinic and will not be stored at home. NOTE: This sheet is a summary. It may not cover all possible information. If you have questions about this medicine, talk to your doctor, pharmacist, or health care provider.  2013, [employer] Standard. (01/30/2011 9:06:58 AM) Carfilzomib injection What is this medicine? CARFILZOMIB is a chemotherapy drug that works by slowing or stopping cancer cell growth. This medicine is used to treat multiple myeloma. This medicine may be used for other purposes; ask your health care provider or pharmacist if you have questions. What should I tell my health care provider before I take this medicine? They need to know if you have any of these conditions: -heart disease -irregular heartbeat -liver disease -lung or breathing disease -an unusual or allergic reaction to carfilzomib, or other medicines, foods, dyes, or preservatives -pregnant or trying to get pregnant -breast-feeding How should I use this medicine? This medicine is for injection or infusion into a vein. It is given by a health care professional in a hospital or clinic setting. Talk to your pediatrician regarding the use of this medicine in children. Special care may be needed. Overdosage: If you think you've taken too much of this medicine contact a poison control center or emergency room at once. Overdosage: If you think you have taken too much of this medicine contact a poison control center or emergency room at once. NOTE: This medicine is only for you. Do not share this medicine with others. What if I miss a dose? It is important not to miss your dose. Call your doctor or health care professional if you are unable to keep an appointment. What may interact with this medicine? Interactions are not expected. Give your health care provider a list of all the medicines, herbs, non-prescription drugs, or dietary  supplements you use. Also tell them if you smoke, drink alcohol, or use illegal drugs. Some items may interact with your medicine. This list may not describe all possible interactions. Give your health care provider a list of all the medicines, herbs, non-prescription drugs, or dietary supplements you use. Also tell them if you smoke, drink alcohol, or use illegal drugs. Some items may interact with your medicine. What should I watch for while using this medicine? Your condition will be monitored carefully while you are receiving this medicine. Report any side effects. Continue your course of treatment even though you feel ill unless your doctor tells you to stop. Call your doctor or health care professional for advice if you get a fever, chills or sore throat, or other symptoms of a cold or flu. Do not treat yourself. Try to avoid being around people who are sick. Do not become pregnant while taking this medicine. Women should inform their doctor if they wish to become pregnant or think they might be pregnant. There is a potential for serious side effects to an unborn child. Talk to your health care professional or pharmacist for more information. Do not breast-feed an infant while taking this medicine. Check with your doctor or health care professional if you get an attack of severe diarrhea, nausea and vomiting, or if you sweat a lot. The loss of too much   body fluid can make it dangerous for you to take this medicine. You may get dizzy. Do not drive, use machinery, or do anything that needs mental alertness until you know how this medicine affects you. Do not stand or sit up quickly, especially if you are an older patient. This reduces the risk of dizzy or fainting spells. What side effects may I notice from receiving this medicine? Side effects that you should report to your doctor or health care professional as soon as possible: -allergic reactions like skin rash, itching or hives, swelling of the  face, lips, or tongue -breathing problems -chest pain or palpitationschest tightness -cough -dark urine -dizziness -feeling faint or lightheaded -fever or chills -general ill feeling or flu-like symptoms -light-colored stools -palpitations -right upper belly pain -swelling of the legs or ankles -unusual bleeding or bruising -unusually weak or tired -yellowing of the eyes or skin  Side effects that usually do not require medical attention (Report these to your doctor or health care professional if they continue or are bothersome.): -diarrhea -headache -nausea, vomiting -tiredness This list may not describe all possible side effects. Call your doctor for medical advice about side effects. You may report side effects to FDA at 1-800-FDA-1088. Where should I keep my medicine? This drug is given in a hospital or clinic and will not be stored at home. NOTE: This sheet is a summary. It may not cover all possible information. If you have questions about this medicine, talk to your doctor, pharmacist, or health care provider.  2013, [employer]. (07/23/2011 4:16:30 PM)  

## 2012-07-12 NOTE — Progress Notes (Signed)
CC:   [name], M.D.  DIAGNOSES: 1. Recurrent lambda light chain myeloma. 2. Anemia secondary to renal insufficiency. 3. Insulin-dependent diabetes.  CURRENT THERAPY: 1. The patient is currently receiving his 3rd cycle of     Kyprolis/Cytoxan. 2. Zometa 3.3 mg IV monthly.  INTERIM HISTORY:  Mr. Langham comes in for followup.  He is doing okay. He got through this pneumonia a month or so ago.  I did go ahead and give some IVIG with his last visit.  He is not hurting.  His appetite is doing okay.  He does have a "bad taste" in his mouth secondary to the chemotherapy.  He has had no fever.  He has had no rashes.  He has had no headache.  Back in late October, his lambda light chain was 168 mg/dL.  PHYSICAL EXAMINATION:  General:  This is a well-developed, well- nourished black gentleman in no obvious distress.  Vital signs:  97.6, pulse 96, respiratory rate 18, blood pressure 113/54.  Weight is 227. Head and neck:  Normocephalic, atraumatic skull.  There are no ocular or oral lesions.  There are no palpable cervical or supraclavicular lymph nodes.  Lungs:  Clear bilaterally.  Cardiac:  Regular rate and rhythm with a normal S1 and S2.  There are no murmurs, rubs, or bruits. Abdomen:  Soft with good bowel sounds.  There is no palpable abdominal mass.  No palpable hepatosplenomegaly.  Extremities:  No clubbing, cyanosis or edema.  Neurological:  No focal neurological deficits.  LABORATORY STUDIES:  White blood cell count is 6.8, hemoglobin 10, hematocrit 38.1, platelet count 173.  IMPRESSION:  Mr. Deboer is a 78 year old gentleman with recurrent lambda light chain myeloma.  I think that this next lambda light chain level will be critical.  If find that it is still going up, I just do not see that we are going to have to continue him on Kyprolis.  I know that he is able to do a clinical trial at [facility]  This may be the next step for him, depending on our results  here.  I do know that the FDA is getting ready to approve a new monoclonal antibody.  This is an anti- CD38 monoclonal antibody.  It is called daratumumab.  We will continue him on the Kyprolis/Cytoxan for now.  He will need IVIG again.  We may consider doing this in a couple months.    ______________________________ [name], M.D. PRE/MEDQ  D:  07/11/2012  T:  07/12/2012  Job:  [id number]

## 2012-07-13 ENCOUNTER — Encounter: Payer: Self-pay | Admitting: Hematology & Oncology

## 2012-07-18 ENCOUNTER — Other Ambulatory Visit: Payer: Self-pay | Admitting: Hematology & Oncology

## 2012-07-18 ENCOUNTER — Other Ambulatory Visit (HOSPITAL_BASED_OUTPATIENT_CLINIC_OR_DEPARTMENT_OTHER): Payer: Medicare PPO | Admitting: Lab

## 2012-07-18 ENCOUNTER — Ambulatory Visit (HOSPITAL_BASED_OUTPATIENT_CLINIC_OR_DEPARTMENT_OTHER): Payer: Medicare PPO

## 2012-07-18 VITALS — BP 127/76 | HR 79 | Temp 97.6°F | Resp 18

## 2012-07-18 DIAGNOSIS — Z5112 Encounter for antineoplastic immunotherapy: Secondary | ICD-10-CM

## 2012-07-18 DIAGNOSIS — C9002 Multiple myeloma in relapse: Secondary | ICD-10-CM

## 2012-07-18 LAB — CBC WITH DIFFERENTIAL (CANCER CENTER ONLY)
BASO#: 0 10*3/uL (ref 0.0–0.2)
EOS%: 1.3 % (ref 0.0–7.0)
Eosinophils Absolute: 0.1 10*3/uL (ref 0.0–0.5)
HCT: 27.9 % — ABNORMAL LOW (ref 38.7–49.9)
HGB: 9.1 g/dL — ABNORMAL LOW (ref 13.0–17.1)
LYMPH#: 0.6 10*3/uL — ABNORMAL LOW (ref 0.9–3.3)
MCH: 30.2 pg (ref 28.0–33.4)
MCHC: 32.6 g/dL (ref 32.0–35.9)
NEUT%: 72.7 % (ref 40.0–80.0)
RBC: 3.01 10*6/uL — ABNORMAL LOW (ref 4.20–5.70)

## 2012-07-18 MED ORDER — SODIUM CHLORIDE 0.9 % IV SOLN
Freq: Once | INTRAVENOUS | Status: AC
  Administered 2012-07-18: 10:00:00 via INTRAVENOUS

## 2012-07-18 MED ORDER — SODIUM CHLORIDE 0.9 % IJ SOLN
10.0000 mL | INTRAMUSCULAR | Status: DC | PRN
  Administered 2012-07-18: 10 mL
  Filled 2012-07-18: qty 10

## 2012-07-18 MED ORDER — DEXTROSE 5 % IV SOLN
27.0000 mg/m2 | Freq: Once | INTRAVENOUS | Status: AC
  Administered 2012-07-18: 60 mg via INTRAVENOUS
  Filled 2012-07-18: qty 30

## 2012-07-18 MED ORDER — SODIUM CHLORIDE 0.9 % IV SOLN
Freq: Once | INTRAVENOUS | Status: DC
Start: 2012-07-18 — End: 2012-07-18

## 2012-07-18 MED ORDER — ONDANSETRON 8 MG/50ML IVPB (CHCC)
8.0000 mg | Freq: Once | INTRAVENOUS | Status: AC
  Administered 2012-07-18: 8 mg via INTRAVENOUS

## 2012-07-18 MED ORDER — HEPARIN SOD (PORK) LOCK FLUSH 100 UNIT/ML IV SOLN
500.0000 [IU] | Freq: Once | INTRAVENOUS | Status: AC | PRN
  Administered 2012-07-18: 500 [IU]
  Filled 2012-07-18: qty 5

## 2012-07-18 MED ORDER — SODIUM CHLORIDE 0.9 % IV SOLN: Freq: Once | INTRAVENOUS | Status: DC

## 2012-07-18 NOTE — Patient Instructions (Signed)
Carfilzomib injection What is this medicine? CARFILZOMIB is a chemotherapy drug that works by slowing or stopping cancer cell growth. This medicine is used to treat multiple myeloma. This medicine may be used for other purposes; ask your health care provider or pharmacist if you have questions. What should I tell my health care provider before I take this medicine? They need to know if you have any of these conditions: -heart disease -irregular heartbeat -liver disease -lung or breathing disease -an unusual or allergic reaction to carfilzomib, or other medicines, foods, dyes, or preservatives -pregnant or trying to get pregnant -breast-feeding How should I use this medicine? This medicine is for injection or infusion into a vein. It is given by a health care professional in a hospital or clinic setting. Talk to your pediatrician regarding the use of this medicine in children. Special care may be needed. Overdosage: If you think you've taken too much of this medicine contact a poison control center or emergency room at once. Overdosage: If you think you have taken too much of this medicine contact a poison control center or emergency room at once. NOTE: This medicine is only for you. Do not share this medicine with others. What if I miss a dose? It is important not to miss your dose. Call your doctor or health care professional if you are unable to keep an appointment. What may interact with this medicine? Interactions are not expected. Give your health care provider a list of all the medicines, herbs, non-prescription drugs, or dietary supplements you use. Also tell them if you smoke, drink alcohol, or use illegal drugs. Some items may interact with your medicine. This list may not describe all possible interactions. Give your health care provider a list of all the medicines, herbs, non-prescription drugs, or dietary supplements you use. Also tell them if you smoke, drink alcohol, or use  illegal drugs. Some items may interact with your medicine. What should I watch for while using this medicine? Your condition will be monitored carefully while you are receiving this medicine. Report any side effects. Continue your course of treatment even though you feel ill unless your doctor tells you to stop. Call your doctor or health care professional for advice if you get a fever, chills or sore throat, or other symptoms of a cold or flu. Do not treat yourself. Try to avoid being around people who are sick. Do not become pregnant while taking this medicine. Women should inform their doctor if they wish to become pregnant or think they might be pregnant. There is a potential for serious side effects to an unborn child. Talk to your health care professional or pharmacist for more information. Do not breast-feed an infant while taking this medicine. Check with your doctor or health care professional if you get an attack of severe diarrhea, nausea and vomiting, or if you sweat a lot. The loss of too much body fluid can make it dangerous for you to take this medicine. You may get dizzy. Do not drive, use machinery, or do anything that needs mental alertness until you know how this medicine affects you. Do not stand or sit up quickly, especially if you are an older patient. This reduces the risk of dizzy or fainting spells. What side effects may I notice from receiving this medicine? Side effects that you should report to your doctor or health care professional as soon as possible: -allergic reactions like skin rash, itching or hives, swelling of the face, lips, or tongue -breathing  problems -chest pain or palpitationschest tightness -cough -dark urine -dizziness -feeling faint or lightheaded -fever or chills -general ill feeling or flu-like symptoms -light-colored stools -palpitations -right upper belly pain -swelling of the legs or ankles -unusual bleeding or bruising -unusually weak or  tired -yellowing of the eyes or skin  Side effects that usually do not require medical attention (Report these to your doctor or health care professional if they continue or are bothersome.): -diarrhea -headache -nausea, vomiting -tiredness This list may not describe all possible side effects. Call your doctor for medical advice about side effects. You may report side effects to FDA at 1-800-FDA-1088. Where should I keep my medicine? This drug is given in a hospital or clinic and will not be stored at home. NOTE: This sheet is a summary. It may not cover all possible information. If you have questions about this medicine, talk to your doctor, pharmacist, or health care provider.  2013, [employer] Standard. (07/23/2011 4:16:30 PM)  Cyclophosphamide injection What is this medicine? CYCLOPHOSPHAMIDE (sye kloe FOSS fa mide) is a chemotherapy drug. It slows the growth of cancer cells. This medicine is used to treat many types of cancer like lymphoma, myeloma, leukemia, breast cancer, and ovarian cancer, to name a few. It is also used to treat nephrotic syndrome in children. This medicine may be used for other purposes; ask your health care provider or pharmacist if you have questions. What should I tell my health care provider before I take this medicine? They need to know if you have any of these conditions: -blood disorders -history of other chemotherapy -history of radiation therapy -infection -kidney disease -liver disease -tumors in the bone marrow -an unusual or allergic reaction to cyclophosphamide, other chemotherapy, other medicines, foods, dyes, or preservatives -pregnant or trying to get pregnant -breast-feeding How should I use this medicine? This drug is usually given as an injection into a vein or muscle or by infusion into a vein. It is administered in a hospital or clinic by a specially trained health care professional. Talk to your pediatrician regarding the use of this  medicine in children. While this drug may be prescribed for selected conditions, precautions do apply. Overdosage: If you think you have taken too much of this medicine contact a poison control center or emergency room at once. NOTE: This medicine is only for you. Do not share this medicine with others. What if I miss a dose? It is important not to miss your dose. Call your doctor or health care professional if you are unable to keep an appointment. What may interact with this medicine? Do not take this medicine with any of the following medications: -mibefradil -nalidixic acid This medicine may also interact with the following medications: -doxorubicin -etanercept -medicines to increase blood counts like filgrastim, pegfilgrastim, sargramostim -medicines that block muscle or nerve pain -St. John's Wort -phenobarbital -succinylcholine chloride -trastuzumab -vaccines Talk to your doctor or health care professional before taking any of these medicines: -acetaminophen -aspirin -ibuprofen -ketoprofen -naproxen This list may not describe all possible interactions. Give your health care provider a list of all the medicines, herbs, non-prescription drugs, or dietary supplements you use. Also tell them if you smoke, drink alcohol, or use illegal drugs. Some items may interact with your medicine. What should I watch for while using this medicine? Visit your doctor for checks on your progress. This drug may make you feel generally unwell. This is not uncommon, as chemotherapy can affect healthy cells as well as cancer cells. Report any   effects. Continue your course of treatment even though you feel ill unless your doctor tells you to stop. Drink water or other fluids as directed. Urinate often, even at night. In some cases, you may be given additional medicines to help with side effects. Follow all directions for their use. Call your doctor or health care professional for advice if you get a  fever, chills or sore throat, or other symptoms of a cold or flu. Do not treat yourself. This drug decreases your body's ability to fight infections. Try to avoid being around people who are sick. This medicine may increase your risk to bruise or bleed. Call your doctor or health care professional if you notice any unusual bleeding. Be careful brushing and flossing your teeth or using a toothpick because you may get an infection or bleed more easily. If you have any dental work done, tell your dentist you are receiving this medicine. Avoid taking products that contain aspirin, acetaminophen, ibuprofen, naproxen, or ketoprofen unless instructed by your doctor. These medicines may hide a fever. Do not become pregnant while taking this medicine. Women should inform their doctor if they wish to become pregnant or think they might be pregnant. There is a potential for serious side effects to an unborn child. Talk to your health care professional or pharmacist for more information. Do not breast-feed an infant while taking this medicine. Men should inform their doctor if they wish to father a child. This medicine may lower sperm counts. If you are going to have surgery, tell your doctor or health care professional that you have taken this medicine. What side effects may I notice from receiving this medicine? Side effects that you should report to your doctor or health care professional as soon as possible: -allergic reactions like skin rash, itching or hives, swelling of the face, lips, or tongue -low blood counts - this medicine may decrease the number of white blood cells, red blood cells and platelets. You may be at increased risk for infections and bleeding. -signs of infection - fever or chills, cough, sore throat, pain or difficulty passing urine -signs of decreased platelets or bleeding - bruising, pinpoint red spots on the skin, black, tarry stools, blood in the urine -signs of decreased red blood  cells - unusually weak or tired, fainting spells, lightheadedness -breathing problems -dark urine -mouth sores -pain, swelling, redness at site where injected -swelling of the ankles, feet, hands -trouble passing urine or change in the amount of urine -weight gain -yellowing of the eyes or skin Side effects that usually do not require medical attention (report to your doctor or health care professional if they continue or are bothersome): -changes in nail or skin color -diarrhea -hair loss -loss of appetite -missed menstrual periods -nausea, vomiting -stomach pain This list may not describe all possible side effects. Call your doctor for medical advice about side effects. You may report side effects to FDA at 1-800-FDA-1088. Where should I keep my medicine? This drug is given in a hospital or clinic and will not be stored at home. NOTE: This sheet is a summary. It may not cover all possible information. If you have questions about this medicine, talk to your doctor, pharmacist, or health care provider.  2013, [employer]/Gold Standard. (11/08/2007 2:32:25 PM)    Dehydration, Adult Dehydration is when you lose more fluids from the body than you take in. Vital organs like the kidneys, brain, and heart cannot function without a proper amount of fluids and salt. Any loss  of fluids from the body can cause dehydration.  CAUSES   Vomiting.  Diarrhea.  Excessive sweating.  Excessive urine output.  Fever. SYMPTOMS  Mild dehydration  Thirst.  Dry lips.  Slightly dry mouth. Moderate dehydration  Very dry mouth.  Sunken eyes.  Skin does not bounce back quickly when lightly pinched and released.  Dark urine and decreased urine production.  Decreased tear production.  Headache. Severe dehydration  Very dry mouth.  Extreme thirst.  Rapid, weak pulse (more than 100 beats per minute at rest).  Cold hands and feet.  Not able to sweat in spite of heat and  temperature.  Rapid breathing.  Blue lips.  Confusion and lethargy.  Difficulty being awakened.  Minimal urine production.  No tears. DIAGNOSIS  Your caregiver will diagnose dehydration based on your symptoms and your exam. Blood and urine tests will help confirm the diagnosis. The diagnostic evaluation should also identify the cause of dehydration. TREATMENT  Treatment of mild or moderate dehydration can often be done at home by increasing the amount of fluids that you drink. It is best to drink small amounts of fluid more often. Drinking too much at one time can make vomiting worse. Refer to the home care instructions below. Severe dehydration needs to be treated at the hospital where you will probably be given intravenous (IV) fluids that contain water and electrolytes. HOME CARE INSTRUCTIONS   Ask your caregiver about specific rehydration instructions.  Drink enough fluids to keep your urine clear or pale yellow.  Drink small amounts frequently if you have nausea and vomiting.  Eat as you normally do.  Avoid:  Foods or drinks high in sugar.  Carbonated drinks.  Juice.  Extremely hot or cold fluids.  Drinks with caffeine.  Fatty, greasy foods.  Alcohol.  Tobacco.  Overeating.  Gelatin desserts.  Wash your hands well to avoid spreading bacteria and viruses.  Only take over-the-counter or prescription medicines for pain, discomfort, or fever as directed by your caregiver.  Ask your caregiver if you should continue all prescribed and over-the-counter medicines.  Keep all follow-up appointments with your caregiver. SEEK MEDICAL CARE IF:  You have abdominal pain and it increases or stays in one area (localizes).  You have a rash, stiff neck, or severe headache.  You are irritable, sleepy, or difficult to awaken.  You are weak, dizzy, or extremely thirsty. SEEK IMMEDIATE MEDICAL CARE IF:   You are unable to keep fluids down or you get worse despite  treatment.  You have frequent episodes of vomiting or diarrhea.  You have blood or green matter (bile) in your vomit.  You have blood in your stool or your stool looks black and tarry.  You have not urinated in 6 to 8 hours, or you have only urinated a small amount of very dark urine.  You have a fever.  You faint. MAKE SURE YOU:   Understand these instructions.  Will watch your condition.  Will get help right away if you are not doing well or get worse. Document Released: 08/03/2005 Document Revised: 10/26/2011 Document Reviewed: 03/23/2011 [facility] Patient Information 2013 [id number], [place].

## 2012-07-19 ENCOUNTER — Ambulatory Visit (HOSPITAL_BASED_OUTPATIENT_CLINIC_OR_DEPARTMENT_OTHER): Payer: Medicare PPO

## 2012-07-19 VITALS — BP 140/83 | HR 80 | Temp 97.6°F | Resp 18

## 2012-07-19 DIAGNOSIS — Z452 Encounter for adjustment and management of vascular access device: Secondary | ICD-10-CM

## 2012-07-19 DIAGNOSIS — C9000 Multiple myeloma not having achieved remission: Secondary | ICD-10-CM

## 2012-07-19 DIAGNOSIS — Z5112 Encounter for antineoplastic immunotherapy: Secondary | ICD-10-CM

## 2012-07-19 DIAGNOSIS — C9002 Multiple myeloma in relapse: Secondary | ICD-10-CM

## 2012-07-19 MED ORDER — HEPARIN SOD (PORK) LOCK FLUSH 100 UNIT/ML IV SOLN
500.0000 [IU] | Freq: Once | INTRAVENOUS | Status: AC | PRN
  Administered 2012-07-19: 500 [IU]
  Filled 2012-07-19: qty 5

## 2012-07-19 MED ORDER — DEXTROSE 5 % IV SOLN
27.0000 mg/m2 | Freq: Once | INTRAVENOUS | Status: AC
  Administered 2012-07-19: 60 mg via INTRAVENOUS
  Filled 2012-07-19: qty 30

## 2012-07-19 MED ORDER — ALTEPLASE 2 MG IJ SOLR
2.0000 mg | Freq: Once | INTRAMUSCULAR | Status: AC
  Administered 2012-07-19: 2 mg
  Filled 2012-07-19: qty 2

## 2012-07-19 MED ORDER — ONDANSETRON 8 MG/50ML IVPB (CHCC)
8.0000 mg | Freq: Once | INTRAVENOUS | Status: AC
Start: 2012-07-19 — End: 2012-07-19
  Administered 2012-07-19: 8 mg via INTRAVENOUS

## 2012-07-19 MED ORDER — SODIUM CHLORIDE 0.9 % IV SOLN
Freq: Once | INTRAVENOUS | Status: AC
  Administered 2012-07-19: 14:00:00 via INTRAVENOUS

## 2012-07-19 MED ORDER — SODIUM CHLORIDE 0.9 % IJ SOLN
10.0000 mL | INTRAMUSCULAR | Status: DC | PRN
  Administered 2012-07-19: 10 mL
  Filled 2012-07-19: qty 10

## 2012-07-19 NOTE — Addendum Note (Signed)
Addended by: Rosario Adie A on: 07/19/2012 04:18 PM   Modules accepted: Orders

## 2012-07-19 NOTE — Patient Instructions (Signed)
Carfilzomib injection What is this medicine? CARFILZOMIB is a chemotherapy drug that works by slowing or stopping cancer cell growth. This medicine is used to treat multiple myeloma. This medicine may be used for other purposes; ask your health care provider or pharmacist if you have questions. What should I tell my health care provider before I take this medicine? They need to know if you have any of these conditions: -heart disease -irregular heartbeat -liver disease -lung or breathing disease -an unusual or allergic reaction to carfilzomib, or other medicines, foods, dyes, or preservatives -pregnant or trying to get pregnant -breast-feeding How should I use this medicine? This medicine is for injection or infusion into a vein. It is given by a health care professional in a hospital or clinic setting. Talk to your pediatrician regarding the use of this medicine in children. Special care may be needed. Overdosage: If you think you've taken too much of this medicine contact a poison control center or emergency room at once. Overdosage: If you think you have taken too much of this medicine contact a poison control center or emergency room at once. NOTE: This medicine is only for you. Do not share this medicine with others. What if I miss a dose? It is important not to miss your dose. Call your doctor or health care professional if you are unable to keep an appointment. What may interact with this medicine? Interactions are not expected. Give your health care provider a list of all the medicines, herbs, non-prescription drugs, or dietary supplements you use. Also tell them if you smoke, drink alcohol, or use illegal drugs. Some items may interact with your medicine. This list may not describe all possible interactions. Give your health care provider a list of all the medicines, herbs, non-prescription drugs, or dietary supplements you use. Also tell them if you smoke, drink alcohol, or use  illegal drugs. Some items may interact with your medicine. What should I watch for while using this medicine? Your condition will be monitored carefully while you are receiving this medicine. Report any side effects. Continue your course of treatment even though you feel ill unless your doctor tells you to stop. Call your doctor or health care professional for advice if you get a fever, chills or sore throat, or other symptoms of a cold or flu. Do not treat yourself. Try to avoid being around people who are sick. Do not become pregnant while taking this medicine. Women should inform their doctor if they wish to become pregnant or think they might be pregnant. There is a potential for serious side effects to an unborn child. Talk to your health care professional or pharmacist for more information. Do not breast-feed an infant while taking this medicine. Check with your doctor or health care professional if you get an attack of severe diarrhea, nausea and vomiting, or if you sweat a lot. The loss of too much body fluid can make it dangerous for you to take this medicine. You may get dizzy. Do not drive, use machinery, or do anything that needs mental alertness until you know how this medicine affects you. Do not stand or sit up quickly, especially if you are an older patient. This reduces the risk of dizzy or fainting spells. What side effects may I notice from receiving this medicine? Side effects that you should report to your doctor or health care professional as soon as possible: -allergic reactions like skin rash, itching or hives, swelling of the face, lips, or tongue -breathing   problems -chest pain or palpitationschest tightness -cough -dark urine -dizziness -feeling faint or lightheaded -fever or chills -general ill feeling or flu-like symptoms -light-colored stools -palpitations -right upper belly pain -swelling of the legs or ankles -unusual bleeding or bruising -unusually weak or  tired -yellowing of the eyes or skin  Side effects that usually do not require medical attention (Report these to your doctor or health care professional if they continue or are bothersome.): -diarrhea -headache -nausea, vomiting -tiredness This list may not describe all possible side effects. Call your doctor for medical advice about side effects. You may report side effects to FDA at 1-800-FDA-1088. Where should I keep my medicine? This drug is given in a hospital or clinic and will not be stored at home. NOTE: This sheet is a summary. It may not cover all possible information. If you have questions about this medicine, talk to your doctor, pharmacist, or health care provider.  2013, [employer]/Gold Standard. (07/23/2011 4:16:30 PM)  

## 2012-07-20 LAB — KAPPA/LAMBDA LIGHT CHAINS

## 2012-07-20 LAB — COMPREHENSIVE METABOLIC PANEL
AST: 21 U/L (ref 0–37)
BUN: 18 mg/dL (ref 6–23)
Calcium: 8.6 mg/dL (ref 8.4–10.5)
Chloride: 109 mEq/L (ref 96–112)
Creatinine, Ser: 1.58 mg/dL — ABNORMAL HIGH (ref 0.50–1.35)
Glucose, Bld: 121 mg/dL — ABNORMAL HIGH (ref 70–99)

## 2012-07-25 ENCOUNTER — Encounter: Payer: Self-pay | Admitting: Pharmacist

## 2012-07-26 ENCOUNTER — Ambulatory Visit (INDEPENDENT_AMBULATORY_CARE_PROVIDER_SITE_OTHER): Payer: Medicare PPO | Admitting: Family Medicine

## 2012-07-26 ENCOUNTER — Ambulatory Visit: Payer: Medicare PPO

## 2012-07-26 VITALS — BP 118/74 | HR 114 | Resp 10 | Ht 71.0 in | Wt 220.0 lb

## 2012-07-26 DIAGNOSIS — R05 Cough: Secondary | ICD-10-CM

## 2012-07-26 DIAGNOSIS — D631 Anemia in chronic kidney disease: Secondary | ICD-10-CM

## 2012-07-26 DIAGNOSIS — R062 Wheezing: Secondary | ICD-10-CM

## 2012-07-26 DIAGNOSIS — R059 Cough, unspecified: Secondary | ICD-10-CM

## 2012-07-26 DIAGNOSIS — J441 Chronic obstructive pulmonary disease with (acute) exacerbation: Secondary | ICD-10-CM

## 2012-07-26 DIAGNOSIS — C9002 Multiple myeloma in relapse: Secondary | ICD-10-CM

## 2012-07-26 MED ORDER — BENZONATATE 100 MG PO CAPS: ORAL_CAPSULE | ORAL | Status: DC

## 2012-07-26 MED ORDER — ALBUTEROL SULFATE HFA 108 (90 BASE) MCG/ACT IN AERS: 2.0000 | INHALATION_SPRAY | RESPIRATORY_TRACT | Status: DC | PRN

## 2012-07-26 MED ORDER — DOXYCYCLINE HYCLATE 100 MG PO TABS: 100.0000 mg | ORAL_TABLET | Freq: Two times a day (BID) | ORAL | Status: DC

## 2012-07-26 NOTE — Patient Instructions (Addendum)
Return in May or June for checkup  I will let you know what the radiologist says about the chest x-ray if he notices anything differently  Use the inhaler and let me know if the wheezing is not doing better  Take the doxycycline twice daily for infection, and use the cough pills as needed for coughing. If you're getting sicker please return.

## 2012-07-26 NOTE — Progress Notes (Signed)
  Subjective: Patient has been having a cough for the past 10 days or so he. He wanted to make sure he is not getting a pneumonia back again. He feels pretty good except for he wheezes when he lays down a little bit at night. He was supposed to have gotten inhaler last visit, and says it wasn't at the pharmacy. I checked the chart it looks like it was prescribed. He otherwise feels pretty good. Stays fairly active. Gets his chemotherapy every other week for the old multiple myeloma. He's been on chemotherapy for that for a number of years, and is hoping that he can get on a pill sometime soon when he gets released. Otherwise review of systems fairly unremarkable. HEENT normal. GI normal. GU normal  Objective: Pleasant gentleman no acute distress. TMs normal. Throat clear. Neck supple without nodes or thyromegaly. No carotid bruits. Chest clear to auscultation. Heart regular without murmurs. Abdomen soft without mass or tenderness  [facility] reading (PRIMARY) by  Dr. Alwyn Ren pulmonay scarring.  Nodular area on lateral view in lower lobes..  Assessment: Cough Mild wheezing at night History of multiple myeloma Possible pulmonary nodule or versus old scarring  Plan: Decide going to give her course of doxycycline. Don't need to see him back for 4-6 months, and osseous having further problems. However we will await the radiology reading.

## 2012-07-27 ENCOUNTER — Telehealth: Payer: Self-pay | Admitting: Oncology

## 2012-07-27 NOTE — Telephone Encounter (Addendum)
Message copied by Lacie Draft on Wed Jul 27, 2012  4:31 PM ------      Message from: Arlan Organ R      Created: Mon Jul 25, 2012  6:47 PM       Call - tell him the the myeloma is slowly getting better!!!  Arthur Valdez  Left message on patient's answering machine. Teola Bradley, Zalika Tieszen [employer]

## 2012-07-29 ENCOUNTER — Ambulatory Visit (HOSPITAL_BASED_OUTPATIENT_CLINIC_OR_DEPARTMENT_OTHER): Payer: Medicare PPO

## 2012-07-29 VITALS — BP 145/80 | HR 80 | Temp 97.4°F | Resp 20

## 2012-07-29 DIAGNOSIS — Z5112 Encounter for antineoplastic immunotherapy: Secondary | ICD-10-CM

## 2012-07-29 DIAGNOSIS — C9002 Multiple myeloma in relapse: Secondary | ICD-10-CM

## 2012-07-29 DIAGNOSIS — C9000 Multiple myeloma not having achieved remission: Secondary | ICD-10-CM

## 2012-07-29 MED ORDER — HEPARIN SOD (PORK) LOCK FLUSH 100 UNIT/ML IV SOLN
500.0000 [IU] | Freq: Once | INTRAVENOUS | Status: AC | PRN
  Administered 2012-07-29: 500 [IU] via INTRAVENOUS
  Filled 2012-07-29: qty 5

## 2012-07-29 MED ORDER — IMMUNE GLOBULIN (HUMAN) 20 GM/200ML IV SOLN
40.0000 g | Freq: Once | INTRAVENOUS | Status: AC
  Administered 2012-07-29: 40 g via INTRAVENOUS
  Filled 2012-07-29: qty 400

## 2012-07-29 MED ORDER — ALTEPLASE 2 MG IJ SOLR
2.0000 mg | Freq: Once | INTRAMUSCULAR | Status: DC | PRN
  Filled 2012-07-29: qty 2

## 2012-07-29 MED ORDER — ZOLEDRONIC ACID 4 MG/5ML IV CONC: 3.5000 mg | Freq: Once | INTRAVENOUS | Status: DC

## 2012-07-29 MED ORDER — DEXTROSE 5 % IV SOLN
2.0000 g | Freq: Once | INTRAVENOUS | Status: AC
  Administered 2012-07-29: 2 g via INTRAVENOUS
  Filled 2012-07-29: qty 2

## 2012-07-29 MED ORDER — DIPHENHYDRAMINE HCL 25 MG PO CAPS
25.0000 mg | ORAL_CAPSULE | Freq: Once | ORAL | Status: AC
  Administered 2012-07-29: 25 mg via ORAL

## 2012-07-29 MED ORDER — SODIUM CHLORIDE 0.9 % IJ SOLN
10.0000 mL | INTRAMUSCULAR | Status: DC | PRN
  Administered 2012-07-29: 10 mL via INTRAVENOUS
  Filled 2012-07-29: qty 10

## 2012-07-29 MED ORDER — ACETAMINOPHEN 325 MG PO TABS
650.0000 mg | ORAL_TABLET | Freq: Once | ORAL | Status: AC
  Administered 2012-07-29: 650 mg via ORAL

## 2012-07-29 NOTE — Patient Instructions (Addendum)

## 2012-08-01 ENCOUNTER — Ambulatory Visit: Payer: Medicare PPO | Admitting: Hematology & Oncology

## 2012-08-01 ENCOUNTER — Ambulatory Visit: Payer: Medicare PPO

## 2012-08-01 ENCOUNTER — Other Ambulatory Visit: Payer: Medicare PPO | Admitting: Lab

## 2012-08-01 ENCOUNTER — Ambulatory Visit (INDEPENDENT_AMBULATORY_CARE_PROVIDER_SITE_OTHER): Payer: Medicare PPO | Admitting: Family Medicine

## 2012-08-01 VITALS — BP 128/88 | HR 85 | Temp 98.4°F | Resp 18 | Ht 71.0 in | Wt 218.0 lb

## 2012-08-01 DIAGNOSIS — E1029 Type 1 diabetes mellitus with other diabetic kidney complication: Secondary | ICD-10-CM

## 2012-08-01 LAB — POCT GLYCOSYLATED HEMOGLOBIN (HGB A1C): Hemoglobin A1C: 6.8

## 2012-08-01 NOTE — Progress Notes (Signed)
  Subjective: Patient is concerned about his medicine. He read the literature on Januvia and there are too many side effects. He is feeling fine and doing well. He still wheezes at night. His lungs are clear however.  Objective: Lungs clear to auscultation. Heart regular without murmurs.  Results for orders placed in visit on 08/01/12  GLUCOSE, POCT (MANUAL RESULT ENTRY)      Component Value Range   POC Glucose 113 (*) 70 - 99 mg/dl  POCT GLYCOSYLATED HEMOGLOBIN (HGB A1C)      Component Value Range   Hemoglobin A1C 6.8     Assessment: Diabetes well-controlled Asthma with nocturnal wheezing  Plan: Left him on the same medication. I did not give him any new medicine for his asthma. If he keeps having symptoms will probably add Atrovent

## 2012-08-01 NOTE — Patient Instructions (Addendum)
Continue current medication. If your diabetes is in excellent control.

## 2012-08-02 ENCOUNTER — Ambulatory Visit: Payer: Medicare PPO

## 2012-08-04 ENCOUNTER — Telehealth: Payer: Self-pay | Admitting: Hematology & Oncology

## 2012-08-04 NOTE — Telephone Encounter (Signed)
Pt called wanting to cancel 12-23 and 24 appointments. Per MD ok but add MD visit to 12-31 appointment. Pt aware

## 2012-08-08 ENCOUNTER — Ambulatory Visit: Payer: Medicare PPO

## 2012-08-08 ENCOUNTER — Ambulatory Visit: Payer: Medicare PPO | Admitting: Hematology & Oncology

## 2012-08-08 ENCOUNTER — Other Ambulatory Visit: Payer: Medicare PPO | Admitting: Lab

## 2012-08-09 ENCOUNTER — Ambulatory Visit: Payer: Medicare PPO

## 2012-08-15 ENCOUNTER — Ambulatory Visit (HOSPITAL_BASED_OUTPATIENT_CLINIC_OR_DEPARTMENT_OTHER): Payer: Medicare PPO

## 2012-08-15 ENCOUNTER — Other Ambulatory Visit (HOSPITAL_BASED_OUTPATIENT_CLINIC_OR_DEPARTMENT_OTHER): Payer: Medicare PPO | Admitting: Lab

## 2012-08-15 VITALS — BP 148/88 | HR 94 | Temp 97.9°F | Resp 18

## 2012-08-15 DIAGNOSIS — C9002 Multiple myeloma in relapse: Secondary | ICD-10-CM

## 2012-08-15 DIAGNOSIS — Z5111 Encounter for antineoplastic chemotherapy: Secondary | ICD-10-CM

## 2012-08-15 LAB — CBC WITH DIFFERENTIAL (CANCER CENTER ONLY)
BASO#: 0 10*3/uL (ref 0.0–0.2)
Eosinophils Absolute: 0.1 10*3/uL (ref 0.0–0.5)
HGB: 8.9 g/dL — ABNORMAL LOW (ref 13.0–17.1)
LYMPH#: 0.8 10*3/uL — ABNORMAL LOW (ref 0.9–3.3)
MONO#: 0.5 10*3/uL (ref 0.1–0.9)
MONO%: 11.5 % (ref 0.0–13.0)
NEUT#: 3.1 10*3/uL (ref 1.5–6.5)
Platelets: 181 10*3/uL (ref 145–400)
RBC: 2.89 10*6/uL — ABNORMAL LOW (ref 4.20–5.70)
WBC: 4.5 10*3/uL (ref 4.0–10.0)

## 2012-08-15 MED ORDER — ONDANSETRON 8 MG/50ML IVPB (CHCC)
8.0000 mg | Freq: Once | INTRAVENOUS | Status: AC
  Administered 2012-08-15: 8 mg via INTRAVENOUS

## 2012-08-15 MED ORDER — SODIUM CHLORIDE 0.9 % IV SOLN: Freq: Once | INTRAVENOUS | Status: DC

## 2012-08-15 MED ORDER — ALTEPLASE 2 MG IJ SOLR
2.0000 mg | Freq: Once | INTRAMUSCULAR | Status: DC | PRN
  Filled 2012-08-15: qty 2

## 2012-08-15 MED ORDER — HEPARIN SOD (PORK) LOCK FLUSH 100 UNIT/ML IV SOLN
250.0000 [IU] | Freq: Once | INTRAVENOUS | Status: DC | PRN
Start: 2012-08-15 — End: 2012-08-15
  Filled 2012-08-15: qty 5

## 2012-08-15 MED ORDER — SODIUM CHLORIDE 0.9 % IJ SOLN
3.0000 mL | INTRAMUSCULAR | Status: DC | PRN
  Filled 2012-08-15: qty 10

## 2012-08-15 MED ORDER — SODIUM CHLORIDE 0.9 % IV SOLN
300.0000 mg/m2 | Freq: Once | INTRAVENOUS | Status: AC
  Administered 2012-08-15: 660 mg via INTRAVENOUS
  Filled 2012-08-15: qty 33

## 2012-08-15 MED ORDER — DEXTROSE 5 % IV SOLN
27.0000 mg/m2 | Freq: Once | INTRAVENOUS | Status: AC
  Administered 2012-08-15: 60 mg via INTRAVENOUS
  Filled 2012-08-15: qty 30

## 2012-08-15 MED ORDER — SODIUM CHLORIDE 0.9 % IJ SOLN
10.0000 mL | INTRAMUSCULAR | Status: DC | PRN
  Administered 2012-08-15: 10 mL
  Filled 2012-08-15: qty 10

## 2012-08-15 MED ORDER — SODIUM CHLORIDE 0.9 % IV SOLN
3.5000 mg | Freq: Once | INTRAVENOUS | Status: AC
  Administered 2012-08-15: 3.5 mg via INTRAVENOUS
  Filled 2012-08-15: qty 4.38

## 2012-08-15 MED ORDER — HEPARIN SOD (PORK) LOCK FLUSH 100 UNIT/ML IV SOLN
500.0000 [IU] | Freq: Once | INTRAVENOUS | Status: AC | PRN
  Administered 2012-08-15: 500 [IU]
  Filled 2012-08-15: qty 5

## 2012-08-15 MED ORDER — SODIUM CHLORIDE 0.9 % IV SOLN
Freq: Once | INTRAVENOUS | Status: AC
  Administered 2012-08-15: 11:00:00 via INTRAVENOUS

## 2012-08-15 NOTE — Patient Instructions (Signed)
Carfilzomib injection What is this medicine? CARFILZOMIB is a chemotherapy drug that works by slowing or stopping cancer cell growth. This medicine is used to treat multiple myeloma. This medicine may be used for other purposes; ask your health care provider or pharmacist if you have questions. What should I tell my health care provider before I take this medicine? They need to know if you have any of these conditions: -heart disease -irregular heartbeat -liver disease -lung or breathing disease -an unusual or allergic reaction to carfilzomib, or other medicines, foods, dyes, or preservatives -pregnant or trying to get pregnant -breast-feeding How should I use this medicine? This medicine is for injection or infusion into a vein. It is given by a health care professional in a hospital or clinic setting. Talk to your pediatrician regarding the use of this medicine in children. Special care may be needed. Overdosage: If you think you've taken too much of this medicine contact a poison control center or emergency room at once. Overdosage: If you think you have taken too much of this medicine contact a poison control center or emergency room at once. NOTE: This medicine is only for you. Do not share this medicine with others. What if I miss a dose? It is important not to miss your dose. Call your doctor or health care professional if you are unable to keep an appointment. What may interact with this medicine? Interactions are not expected. Give your health care provider a list of all the medicines, herbs, non-prescription drugs, or dietary supplements you use. Also tell them if you smoke, drink alcohol, or use illegal drugs. Some items may interact with your medicine. This list may not describe all possible interactions. Give your health care provider a list of all the medicines, herbs, non-prescription drugs, or dietary supplements you use. Also tell them if you smoke, drink alcohol, or use  illegal drugs. Some items may interact with your medicine. What should I watch for while using this medicine? Your condition will be monitored carefully while you are receiving this medicine. Report any side effects. Continue your course of treatment even though you feel ill unless your doctor tells you to stop. Call your doctor or health care professional for advice if you get a fever, chills or sore throat, or other symptoms of a cold or flu. Do not treat yourself. Try to avoid being around people who are sick. Do not become pregnant while taking this medicine. Women should inform their doctor if they wish to become pregnant or think they might be pregnant. There is a potential for serious side effects to an unborn child. Talk to your health care professional or pharmacist for more information. Do not breast-feed an infant while taking this medicine. Check with your doctor or health care professional if you get an attack of severe diarrhea, nausea and vomiting, or if you sweat a lot. The loss of too much body fluid can make it dangerous for you to take this medicine. You may get dizzy. Do not drive, use machinery, or do anything that needs mental alertness until you know how this medicine affects you. Do not stand or sit up quickly, especially if you are an older patient. This reduces the risk of dizzy or fainting spells. What side effects may I notice from receiving this medicine? Side effects that you should report to your doctor or health care professional as soon as possible: -allergic reactions like skin rash, itching or hives, swelling of the face, lips, or tongue -breathing   problems -chest pain or palpitationschest tightness -cough -dark urine -dizziness -feeling faint or lightheaded -fever or chills -general ill feeling or flu-like symptoms -light-colored stools -palpitations -right upper belly pain -swelling of the legs or ankles -unusual bleeding or bruising -unusually weak or  tired -yellowing of the eyes or skin  Side effects that usually do not require medical attention (Report these to your doctor or health care professional if they continue or are bothersome.): -diarrhea -headache -nausea, vomiting -tiredness This list may not describe all possible side effects. Call your doctor for medical advice about side effects. You may report side effects to FDA at 1-800-FDA-1088. Where should I keep my medicine? This drug is given in a hospital or clinic and will not be stored at home. NOTE: This sheet is a summary. It may not cover all possible information. If you have questions about this medicine, talk to your doctor, pharmacist, or health care provider.  2013, [employer]/Gold Standard. (07/23/2011 4:16:30 PM)  

## 2012-08-16 ENCOUNTER — Ambulatory Visit: Payer: Medicare PPO

## 2012-08-16 ENCOUNTER — Ambulatory Visit (HOSPITAL_BASED_OUTPATIENT_CLINIC_OR_DEPARTMENT_OTHER): Payer: Medicare PPO | Admitting: Hematology & Oncology

## 2012-08-16 ENCOUNTER — Ambulatory Visit (HOSPITAL_BASED_OUTPATIENT_CLINIC_OR_DEPARTMENT_OTHER): Payer: Medicare PPO

## 2012-08-16 ENCOUNTER — Ambulatory Visit: Payer: Medicare PPO | Admitting: Hematology & Oncology

## 2012-08-16 VITALS — BP 123/81 | HR 98 | Temp 97.4°F | Resp 20

## 2012-08-16 VITALS — BP 118/59 | HR 97 | Temp 97.5°F | Resp 18 | Ht 71.0 in | Wt 220.0 lb

## 2012-08-16 DIAGNOSIS — C9002 Multiple myeloma in relapse: Secondary | ICD-10-CM

## 2012-08-16 DIAGNOSIS — Z5112 Encounter for antineoplastic immunotherapy: Secondary | ICD-10-CM

## 2012-08-16 MED ORDER — SODIUM CHLORIDE 0.9 % IV SOLN
Freq: Once | INTRAVENOUS | Status: AC
  Administered 2012-08-16: 12:00:00 via INTRAVENOUS

## 2012-08-16 MED ORDER — SODIUM CHLORIDE 0.9 % IJ SOLN
10.0000 mL | INTRAMUSCULAR | Status: DC | PRN
  Administered 2012-08-16: 10 mL
  Filled 2012-08-16: qty 10

## 2012-08-16 MED ORDER — HEPARIN SOD (PORK) LOCK FLUSH 100 UNIT/ML IV SOLN
500.0000 [IU] | Freq: Once | INTRAVENOUS | Status: AC | PRN
  Administered 2012-08-16: 500 [IU]
  Filled 2012-08-16: qty 5

## 2012-08-16 MED ORDER — SODIUM CHLORIDE 0.9 % IV SOLN: Freq: Once | INTRAVENOUS | Status: DC

## 2012-08-16 MED ORDER — ONDANSETRON 8 MG/50ML IVPB (CHCC)
8.0000 mg | Freq: Once | INTRAVENOUS | Status: AC
  Administered 2012-08-16: 8 mg via INTRAVENOUS

## 2012-08-16 MED ORDER — DEXTROSE 5 % IV SOLN
27.0000 mg/m2 | Freq: Once | INTRAVENOUS | Status: AC
  Administered 2012-08-16: 60 mg via INTRAVENOUS
  Filled 2012-08-16: qty 30

## 2012-08-16 NOTE — Progress Notes (Signed)
This office note has been dictated.

## 2012-08-16 NOTE — Patient Instructions (Addendum)
Carfilzomib injection What is this medicine? CARFILZOMIB is a chemotherapy drug that works by slowing or stopping cancer cell growth. This medicine is used to treat multiple myeloma. This medicine may be used for other purposes; ask your health care provider or pharmacist if you have questions. What should I tell my health care provider before I take this medicine? They need to know if you have any of these conditions: -heart disease -irregular heartbeat -liver disease -lung or breathing disease -an unusual or allergic reaction to carfilzomib, or other medicines, foods, dyes, or preservatives -pregnant or trying to get pregnant -breast-feeding How should I use this medicine? This medicine is for injection or infusion into a vein. It is given by a health care professional in a hospital or clinic setting. Talk to your pediatrician regarding the use of this medicine in children. Special care may be needed. Overdosage: If you think you've taken too much of this medicine contact a poison control center or emergency room at once. Overdosage: If you think you have taken too much of this medicine contact a poison control center or emergency room at once. NOTE: This medicine is only for you. Do not share this medicine with others. What if I miss a dose? It is important not to miss your dose. Call your doctor or health care professional if you are unable to keep an appointment. What may interact with this medicine? Interactions are not expected. Give your health care provider a list of all the medicines, herbs, non-prescription drugs, or dietary supplements you use. Also tell them if you smoke, drink alcohol, or use illegal drugs. Some items may interact with your medicine. This list may not describe all possible interactions. Give your health care provider a list of all the medicines, herbs, non-prescription drugs, or dietary supplements you use. Also tell them if you smoke, drink alcohol, or use  illegal drugs. Some items may interact with your medicine. What should I watch for while using this medicine? Your condition will be monitored carefully while you are receiving this medicine. Report any side effects. Continue your course of treatment even though you feel ill unless your doctor tells you to stop. Call your doctor or health care professional for advice if you get a fever, chills or sore throat, or other symptoms of a cold or flu. Do not treat yourself. Try to avoid being around people who are sick. Do not become pregnant while taking this medicine. Women should inform their doctor if they wish to become pregnant or think they might be pregnant. There is a potential for serious side effects to an unborn child. Talk to your health care professional or pharmacist for more information. Do not breast-feed an infant while taking this medicine. Check with your doctor or health care professional if you get an attack of severe diarrhea, nausea and vomiting, or if you sweat a lot. The loss of too much body fluid can make it dangerous for you to take this medicine. You may get dizzy. Do not drive, use machinery, or do anything that needs mental alertness until you know how this medicine affects you. Do not stand or sit up quickly, especially if you are an older patient. This reduces the risk of dizzy or fainting spells. What side effects may I notice from receiving this medicine? Side effects that you should report to your doctor or health care professional as soon as possible: -allergic reactions like skin rash, itching or hives, swelling of the face, lips, or tongue -breathing   problems -chest pain or palpitationschest tightness -cough -dark urine -dizziness -feeling faint or lightheaded -fever or chills -general ill feeling or flu-like symptoms -light-colored stools -palpitations -right upper belly pain -swelling of the legs or ankles -unusual bleeding or bruising -unusually weak or  tired -yellowing of the eyes or skin  Side effects that usually do not require medical attention (Report these to your doctor or health care professional if they continue or are bothersome.): -diarrhea -headache -nausea, vomiting -tiredness This list may not describe all possible side effects. Call your doctor for medical advice about side effects. You may report side effects to FDA at 1-800-FDA-1088. Where should I keep my medicine? This drug is given in a hospital or clinic and will not be stored at home. NOTE: This sheet is a summary. It may not cover all possible information. If you have questions about this medicine, talk to your doctor, pharmacist, or health care provider.  2013, [employer]vier/[employer]. (07/23/2011 4:16:30 PM)  

## 2012-08-17 HISTORY — PX: PERCUTANEOUS PINNING FEMORAL NECK FRACTURE: SUR1014

## 2012-08-17 NOTE — Progress Notes (Signed)
CC:   [name], M.D.  DIAGNOSES: 1. Recurrent lambda light chain myeloma. 2. Insulin-dependent diabetes. 3. Anemia secondary to renal insufficiency.  CURRENT THERAPY: 1. Patient status post 3 cycles of Kyprolis/Cytoxan. 2. Zometa 3.3 mg IV monthly.  INTERIM HISTORY:  Mr. Depaul comes in for his followup.  He is doing well.  He is actually feeling pretty good.  He did have some issues with upper respiratory infection I think a couple of weeks ago.  He has had no problems with nausea or vomiting.  There has been no fevers, sweats or chills.  He has had no leg swelling.  He has had no rashes.  PHYSICAL EXAMINATION:  This is a well-developed, well-nourished black gentleman in no obvious distress.  Vital Signs:  Temperature of 97.5, pulse 97, respiratory rate 18, blood pressure 118/59.  Weight is 226. Head and Neck:  Normocephalic, atraumatic skull.  There are no ocular or oral lesions.  There are no palpable cervical or supraclavicular lymph nodes.  Lungs:  Clear bilaterally.  Cardiac:  Regular rate and rhythm with a normal S1 and S2.  There are no murmurs, rubs or bruits. Abdomen:  Soft with good bowel sounds.  There is no palpable abdominal mass.  There is no palpable hepatosplenomegaly.  Extremities:  No clubbing, cyanosis or edema.  LABORATORY STUDIES:  White cell count 4.5, hemoglobin 8.9, hematocrit 27.7, platelet count 181.  Lambda light chain is 176.  IgG level was 1020 mg/dL.  BUN is 15, creatinine 1.5.  Calcium 8.8 with an albumin of 3.9.  IMPRESSION:  Mr. Ashraf is a 79 year old gentleman with lambda light chain myeloma.  He is holding relatively stable.  Again, we have had some delays with his treatment because of this respiratory infection.  He, I think, is getting IVIG.  I need to make sure that we continue him on this.  I will plan to get him back next week for treatment.  Will start his 5th cycle on January 27th.   ______________________________ [name], M.D. PRE/MEDQ  D:  08/16/2012  T:  08/17/2012  Job:  [id number]

## 2012-08-18 LAB — PROTEIN ELECTROPHORESIS, SERUM, WITH REFLEX
Alpha-1-Globulin: 6.6 % — ABNORMAL HIGH (ref 2.9–4.9)
Alpha-2-Globulin: 12.1 % — ABNORMAL HIGH (ref 7.1–11.8)
Beta Globulin: 6.5 % (ref 4.7–7.2)
Total Protein, Serum Electrophoresis: 6.2 g/dL (ref 6.0–8.3)

## 2012-08-18 LAB — COMPREHENSIVE METABOLIC PANEL
Albumin: 3.9 g/dL (ref 3.5–5.2)
Alkaline Phosphatase: 46 U/L (ref 39–117)
BUN: 15 mg/dL (ref 6–23)
Calcium: 8.8 mg/dL (ref 8.4–10.5)
Chloride: 107 mEq/L (ref 96–112)
Glucose, Bld: 119 mg/dL — ABNORMAL HIGH (ref 70–99)
Potassium: 4 mEq/L (ref 3.5–5.3)
Sodium: 141 mEq/L (ref 135–145)
Total Protein: 6.2 g/dL (ref 6.0–8.3)

## 2012-08-18 LAB — IGG, IGA, IGM: IgM, Serum: 11 mg/dL — ABNORMAL LOW (ref 41–251)

## 2012-08-18 LAB — KAPPA/LAMBDA LIGHT CHAINS
Kappa:Lambda Ratio: 0 — ABNORMAL LOW (ref 0.26–1.65)
Lambda Free Lght Chn: 176 mg/dL — ABNORMAL HIGH (ref 0.57–2.63)

## 2012-08-22 ENCOUNTER — Other Ambulatory Visit (HOSPITAL_BASED_OUTPATIENT_CLINIC_OR_DEPARTMENT_OTHER): Payer: Medicare PPO | Admitting: Lab

## 2012-08-22 ENCOUNTER — Ambulatory Visit (HOSPITAL_BASED_OUTPATIENT_CLINIC_OR_DEPARTMENT_OTHER): Payer: Medicare PPO

## 2012-08-22 VITALS — BP 127/69 | HR 83 | Temp 97.8°F | Resp 18

## 2012-08-22 DIAGNOSIS — Z5112 Encounter for antineoplastic immunotherapy: Secondary | ICD-10-CM

## 2012-08-22 DIAGNOSIS — C9002 Multiple myeloma in relapse: Secondary | ICD-10-CM

## 2012-08-22 LAB — CBC WITH DIFFERENTIAL (CANCER CENTER ONLY)
BASO#: 0 10*3/uL (ref 0.0–0.2)
BASO%: 0.2 % (ref 0.0–2.0)
EOS%: 2.1 % (ref 0.0–7.0)
HCT: 26.8 % — ABNORMAL LOW (ref 38.7–49.9)
HGB: 8.7 g/dL — ABNORMAL LOW (ref 13.0–17.1)
LYMPH%: 17.8 % (ref 14.0–48.0)
MCHC: 32.5 g/dL (ref 32.0–35.9)
MCV: 96 fL (ref 82–98)
MONO#: 0.4 10*3/uL (ref 0.1–0.9)
NEUT%: 69.6 % (ref 40.0–80.0)
RDW: 16.5 % — ABNORMAL HIGH (ref 11.1–15.7)

## 2012-08-22 LAB — CMP (CANCER CENTER ONLY)
ALT(SGPT): 15 U/L (ref 10–47)
CO2: 26 mEq/L (ref 18–33)
Calcium: 8.7 mg/dL (ref 8.0–10.3)
Chloride: 106 mEq/L (ref 98–108)
Glucose, Bld: 139 mg/dL — ABNORMAL HIGH (ref 73–118)
Sodium: 140 mEq/L (ref 128–145)
Total Bilirubin: 0.7 mg/dl (ref 0.20–1.60)
Total Protein: 7.2 g/dL (ref 6.4–8.1)

## 2012-08-22 MED ORDER — SODIUM CHLORIDE 0.9 % IV SOLN: Freq: Once | INTRAVENOUS | Status: DC

## 2012-08-22 MED ORDER — SODIUM CHLORIDE 0.9 % IV SOLN
300.0000 mg/m2 | Freq: Once | INTRAVENOUS | Status: AC
  Administered 2012-08-22: 660 mg via INTRAVENOUS
  Filled 2012-08-22: qty 33

## 2012-08-22 MED ORDER — ONDANSETRON 8 MG/50ML IVPB (CHCC)
8.0000 mg | Freq: Once | INTRAVENOUS | Status: AC
  Administered 2012-08-22: 8 mg via INTRAVENOUS

## 2012-08-22 MED ORDER — DEXTROSE 5 % IV SOLN
27.0000 mg/m2 | Freq: Once | INTRAVENOUS | Status: AC
  Administered 2012-08-22: 60 mg via INTRAVENOUS
  Filled 2012-08-22: qty 30

## 2012-08-22 MED ORDER — SODIUM CHLORIDE 0.9 % IV SOLN
Freq: Once | INTRAVENOUS | Status: AC
  Administered 2012-08-22: 14:00:00 via INTRAVENOUS

## 2012-08-22 MED ORDER — HEPARIN SOD (PORK) LOCK FLUSH 100 UNIT/ML IV SOLN
500.0000 [IU] | Freq: Once | INTRAVENOUS | Status: AC | PRN
  Administered 2012-08-22: 500 [IU]
  Filled 2012-08-22: qty 5

## 2012-08-22 MED ORDER — SODIUM CHLORIDE 0.9 % IJ SOLN
10.0000 mL | INTRAMUSCULAR | Status: DC | PRN
  Administered 2012-08-22: 10 mL
  Filled 2012-08-22: qty 10

## 2012-08-22 NOTE — Patient Instructions (Addendum)
Cyclophosphamide injection What is this medicine? CYCLOPHOSPHAMIDE (sye kloe FOSS fa mide) is a chemotherapy drug. It slows the growth of cancer cells. This medicine is used to treat many types of cancer like lymphoma, myeloma, leukemia, breast cancer, and ovarian cancer, to name a few. It is also used to treat nephrotic syndrome in children. This medicine may be used for other purposes; ask your health care provider or pharmacist if you have questions. What should I tell my health care provider before I take this medicine? They need to know if you have any of these conditions: -blood disorders -history of other chemotherapy -history of radiation therapy -infection -kidney disease -liver disease -tumors in the bone marrow -an unusual or allergic reaction to cyclophosphamide, other chemotherapy, other medicines, foods, dyes, or preservatives -pregnant or trying to get pregnant -breast-feeding How should I use this medicine? This drug is usually given as an injection into a vein or muscle or by infusion into a vein. It is administered in a hospital or clinic by a specially trained health care professional. Talk to your pediatrician regarding the use of this medicine in children. While this drug may be prescribed for selected conditions, precautions do apply. Overdosage: If you think you have taken too much of this medicine contact a poison control center or emergency room at once. NOTE: This medicine is only for you. Do not share this medicine with others. What if I miss a dose? It is important not to miss your dose. Call your doctor or health care professional if you are unable to keep an appointment. What may interact with this medicine? Do not take this medicine with any of the following medications: -mibefradil -nalidixic acid This medicine may also interact with the following medications: -doxorubicin -etanercept -medicines to increase blood counts like filgrastim, pegfilgrastim,  sargramostim -medicines that block muscle or nerve pain -St. John's Wort -phenobarbital -succinylcholine chloride -trastuzumab -vaccines Talk to your doctor or health care professional before taking any of these medicines: -acetaminophen -aspirin -ibuprofen -ketoprofen -naproxen This list may not describe all possible interactions. Give your health care provider a list of all the medicines, herbs, non-prescription drugs, or dietary supplements you use. Also tell them if you smoke, drink alcohol, or use illegal drugs. Some items may interact with your medicine. What should I watch for while using this medicine? Visit your doctor for checks on your progress. This drug may make you feel generally unwell. This is not uncommon, as chemotherapy can affect healthy cells as well as cancer cells. Report any side effects. Continue your course of treatment even though you feel ill unless your doctor tells you to stop. Drink water or other fluids as directed. Urinate often, even at night. In some cases, you may be given additional medicines to help with side effects. Follow all directions for their use. Call your doctor or health care professional for advice if you get a fever, chills or sore throat, or other symptoms of a cold or flu. Do not treat yourself. This drug decreases your body's ability to fight infections. Try to avoid being around people who are sick. This medicine may increase your risk to bruise or bleed. Call your doctor or health care professional if you notice any unusual bleeding. Be careful brushing and flossing your teeth or using a toothpick because you may get an infection or bleed more easily. If you have any dental work done, tell your dentist you are receiving this medicine. Avoid taking products that contain aspirin, acetaminophen, ibuprofen, naproxen,   or ketoprofen unless instructed by your doctor. These medicines may hide a fever. Do not become pregnant while taking this  medicine. Women should inform their doctor if they wish to become pregnant or think they might be pregnant. There is a potential for serious side effects to an unborn child. Talk to your health care professional or pharmacist for more information. Do not breast-feed an infant while taking this medicine. Men should inform their doctor if they wish to father a child. This medicine may lower sperm counts. If you are going to have surgery, tell your doctor or health care professional that you have taken this medicine. What side effects may I notice from receiving this medicine? Side effects that you should report to your doctor or health care professional as soon as possible: -allergic reactions like skin rash, itching or hives, swelling of the face, lips, or tongue -low blood counts - this medicine may decrease the number of white blood cells, red blood cells and platelets. You may be at increased risk for infections and bleeding. -signs of infection - fever or chills, cough, sore throat, pain or difficulty passing urine -signs of decreased platelets or bleeding - bruising, pinpoint red spots on the skin, black, tarry stools, blood in the urine -signs of decreased red blood cells - unusually weak or tired, fainting spells, lightheadedness -breathing problems -dark urine -mouth sores -pain, swelling, redness at site where injected -swelling of the ankles, feet, hands -trouble passing urine or change in the amount of urine -weight gain -yellowing of the eyes or skin Side effects that usually do not require medical attention (report to your doctor or health care professional if they continue or are bothersome): -changes in nail or skin color -diarrhea -hair loss -loss of appetite -missed menstrual periods -nausea, vomiting -stomach pain This list may not describe all possible side effects. Call your doctor for medical advice about side effects. You may report side effects to FDA at  1-800-FDA-1088. Where should I keep my medicine? This drug is given in a hospital or clinic and will not be stored at home. NOTE: This sheet is a summary. It may not cover all possible information. If you have questions about this medicine, talk to your doctor, pharmacist, or health care provider.  2012, [employer]vier/Gold Standard. (11/08/2007 2:32:25 PM)     Carfilzomib injection What is this medicine? CARFILZOMIB is a chemotherapy drug that works by slowing or stopping cancer cell growth. This medicine is used to treat multiple myeloma. This medicine may be used for other purposes; ask your health care provider or pharmacist if you have questions. What should I tell my health care provider before I take this medicine? They need to know if you have any of these conditions: -heart disease -irregular heartbeat -liver disease -lung or breathing disease -an unusual or allergic reaction to carfilzomib, or other medicines, foods, dyes, or preservatives -pregnant or trying to get pregnant -breast-feeding How should I use this medicine? This medicine is for injection or infusion into a vein. It is given by a health care professional in a hospital or clinic setting. Talk to your pediatrician regarding the use of this medicine in children. Special care may be needed. Overdosage: If you think you've taken too much of this medicine contact a poison control center or emergency room at once. Overdosage: If you think you have taken too much of this medicine contact a poison control center or emergency room at once. NOTE: This medicine is only for you. Do not   share this medicine with others. What if I miss a dose? It is important not to miss your dose. Call your doctor or health care professional if you are unable to keep an appointment. What may interact with this medicine? Interactions are not expected. Give your health care provider a list of all the medicines, herbs, non-prescription drugs, or  dietary supplements you use. Also tell them if you smoke, drink alcohol, or use illegal drugs. Some items may interact with your medicine. This list may not describe all possible interactions. Give your health care provider a list of all the medicines, herbs, non-prescription drugs, or dietary supplements you use. Also tell them if you smoke, drink alcohol, or use illegal drugs. Some items may interact with your medicine. What should I watch for while using this medicine? Your condition will be monitored carefully while you are receiving this medicine. Report any side effects. Continue your course of treatment even though you feel ill unless your doctor tells you to stop. Call your doctor or health care professional for advice if you get a fever, chills or sore throat, or other symptoms of a cold or flu. Do not treat yourself. Try to avoid being around people who are sick. Do not become pregnant while taking this medicine. Women should inform their doctor if they wish to become pregnant or think they might be pregnant. There is a potential for serious side effects to an unborn child. Talk to your health care professional or pharmacist for more information. Do not breast-feed an infant while taking this medicine. Check with your doctor or health care professional if you get an attack of severe diarrhea, nausea and vomiting, or if you sweat a lot. The loss of too much body fluid can make it dangerous for you to take this medicine. You may get dizzy. Do not drive, use machinery, or do anything that needs mental alertness until you know how this medicine affects you. Do not stand or sit up quickly, especially if you are an older patient. This reduces the risk of dizzy or fainting spells. What side effects may I notice from receiving this medicine? Side effects that you should report to your doctor or health care professional as soon as possible: -allergic reactions like skin rash, itching or hives, swelling of  the face, lips, or tongue -breathing problems -chest pain or palpitationschest tightness -cough -dark urine -dizziness -feeling faint or lightheaded -fever or chills -general ill feeling or flu-like symptoms -light-colored stools -palpitations -right upper belly pain -swelling of the legs or ankles -unusual bleeding or bruising -unusually weak or tired -yellowing of the eyes or skin  Side effects that usually do not require medical attention (Report these to your doctor or health care professional if they continue or are bothersome.): -diarrhea -headache -nausea, vomiting -tiredness This list may not describe all possible side effects. Call your doctor for medical advice about side effects. You may report side effects to FDA at 1-800-FDA-1088. Where should I keep my medicine? This drug is given in a hospital or clinic and will not be stored at home. NOTE: This sheet is a summary. It may not cover all possible information. If you have questions about this medicine, talk to your doctor, pharmacist, or health care provider.  2013, [employer]. (07/23/2011 4:16:30 PM)  

## 2012-08-23 ENCOUNTER — Ambulatory Visit (HOSPITAL_BASED_OUTPATIENT_CLINIC_OR_DEPARTMENT_OTHER): Payer: Medicare PPO

## 2012-08-23 VITALS — BP 145/84 | HR 94 | Temp 97.2°F | Resp 18

## 2012-08-23 DIAGNOSIS — C9002 Multiple myeloma in relapse: Secondary | ICD-10-CM

## 2012-08-23 DIAGNOSIS — Z452 Encounter for adjustment and management of vascular access device: Secondary | ICD-10-CM

## 2012-08-23 MED ORDER — ONDANSETRON 8 MG/50ML IVPB (CHCC)
8.0000 mg | Freq: Once | INTRAVENOUS | Status: AC
  Administered 2012-08-23: 8 mg via INTRAVENOUS

## 2012-08-23 MED ORDER — HEPARIN SOD (PORK) LOCK FLUSH 100 UNIT/ML IV SOLN
500.0000 [IU] | Freq: Once | INTRAVENOUS | Status: AC | PRN
  Administered 2012-08-23: 500 [IU]
  Filled 2012-08-23: qty 5

## 2012-08-23 MED ORDER — SODIUM CHLORIDE 0.9 % IV SOLN
Freq: Once | INTRAVENOUS | Status: AC
  Administered 2012-08-23: 13:00:00 via INTRAVENOUS

## 2012-08-23 MED ORDER — SODIUM CHLORIDE 0.9 % IJ SOLN
10.0000 mL | INTRAMUSCULAR | Status: DC | PRN
  Administered 2012-08-23: 10 mL
  Filled 2012-08-23: qty 10

## 2012-08-23 MED ORDER — DEXTROSE 5 % IV SOLN
27.0000 mg/m2 | Freq: Once | INTRAVENOUS | Status: DC
  Filled 2012-08-23: qty 30

## 2012-08-23 MED ORDER — ALTEPLASE 2 MG IJ SOLR
2.0000 mg | Freq: Once | INTRAMUSCULAR | Status: AC | PRN
  Administered 2012-08-23: 2 mg
  Filled 2012-08-23: qty 2

## 2012-08-23 MED ORDER — SODIUM CHLORIDE 0.9 % IV SOLN: Freq: Once | INTRAVENOUS | Status: DC

## 2012-08-29 ENCOUNTER — Ambulatory Visit: Payer: Medicare PPO

## 2012-08-29 ENCOUNTER — Other Ambulatory Visit: Payer: Medicare PPO | Admitting: Lab

## 2012-08-29 ENCOUNTER — Ambulatory Visit: Payer: Medicare PPO | Admitting: Hematology & Oncology

## 2012-08-29 NOTE — Progress Notes (Signed)
Patient not being treated with this chemotherapy per dr. Myna Hidalgo. Changes being made in his treatment

## 2012-08-30 ENCOUNTER — Ambulatory Visit: Payer: Medicare PPO

## 2012-09-05 ENCOUNTER — Other Ambulatory Visit: Payer: Medicare PPO | Admitting: Lab

## 2012-09-05 ENCOUNTER — Ambulatory Visit: Payer: Medicare PPO

## 2012-09-05 ENCOUNTER — Ambulatory Visit: Payer: Medicare PPO | Admitting: Hematology & Oncology

## 2012-09-06 ENCOUNTER — Ambulatory Visit: Payer: Medicare PPO

## 2012-09-12 ENCOUNTER — Ambulatory Visit: Payer: Medicare PPO

## 2012-09-12 ENCOUNTER — Other Ambulatory Visit: Payer: Medicare PPO | Admitting: Lab

## 2012-09-12 ENCOUNTER — Ambulatory Visit: Payer: Medicare PPO | Admitting: Hematology & Oncology

## 2012-09-13 ENCOUNTER — Ambulatory Visit: Payer: Medicare PPO

## 2012-09-14 NOTE — Telephone Encounter (Signed)
Opened in error

## 2012-10-18 ENCOUNTER — Other Ambulatory Visit: Payer: Self-pay | Admitting: Hematology & Oncology

## 2012-10-18 DIAGNOSIS — C9000 Multiple myeloma not having achieved remission: Secondary | ICD-10-CM

## 2012-11-15 ENCOUNTER — Ambulatory Visit (INDEPENDENT_AMBULATORY_CARE_PROVIDER_SITE_OTHER): Payer: Medicare PPO | Admitting: Internal Medicine

## 2012-11-15 VITALS — BP 106/52 | HR 97 | Temp 98.1°F | Resp 16 | Ht 71.0 in | Wt 213.0 lb

## 2012-11-15 DIAGNOSIS — Z20828 Contact with and (suspected) exposure to other viral communicable diseases: Secondary | ICD-10-CM

## 2012-11-15 DIAGNOSIS — Z719 Counseling, unspecified: Secondary | ICD-10-CM

## 2012-11-15 MED ORDER — OSELTAMIVIR PHOSPHATE 75 MG PO CAPS: 75.0000 mg | ORAL_CAPSULE | Freq: Every day | ORAL | Status: DC

## 2012-11-15 NOTE — Progress Notes (Signed)
  Subjective:    Patient ID: Arthur Valdez, male    DOB: [birth year], 79 y.o.   MRN: [id number]  HPI Feels fine. Exposed to Flu A, wife in hospital for it.   Review of Systems     Objective:   Physical Exam  Vitals reviewed. Constitutional: He is oriented to person, place, and time. He appears well-developed and well-nourished.  HENT:  Nose: Nose normal.  Cardiovascular: Normal rate.   Pulmonary/Chest: Effort normal.  Neurological: He is alert and oriented to person, place, and time.  Psychiatric: He has a normal mood and affect.          Assessment & Plan:  Exposure to influenza A Tamiflu given

## 2013-02-03 NOTE — Telephone Encounter (Signed)
e

## 2013-06-16 ENCOUNTER — Ambulatory Visit (INDEPENDENT_AMBULATORY_CARE_PROVIDER_SITE_OTHER): Payer: Medicare PPO | Admitting: Family Medicine

## 2013-06-16 ENCOUNTER — Ambulatory Visit: Payer: Medicare PPO

## 2013-06-16 VITALS — BP 127/79 | HR 104 | Temp 97.4°F | Resp 18 | Ht 71.0 in | Wt 224.0 lb

## 2013-06-16 DIAGNOSIS — I10 Essential (primary) hypertension: Secondary | ICD-10-CM

## 2013-06-16 DIAGNOSIS — R079 Chest pain, unspecified: Secondary | ICD-10-CM

## 2013-06-16 DIAGNOSIS — E119 Type 2 diabetes mellitus without complications: Secondary | ICD-10-CM

## 2013-06-16 DIAGNOSIS — R0781 Pleurodynia: Secondary | ICD-10-CM

## 2013-06-16 DIAGNOSIS — S2239XA Fracture of one rib, unspecified side, initial encounter for closed fracture: Secondary | ICD-10-CM

## 2013-06-16 DIAGNOSIS — E039 Hypothyroidism, unspecified: Secondary | ICD-10-CM | POA: Insufficient documentation

## 2013-06-16 DIAGNOSIS — D631 Anemia in chronic kidney disease: Secondary | ICD-10-CM

## 2013-06-16 DIAGNOSIS — S2232XA Fracture of one rib, left side, initial encounter for closed fracture: Secondary | ICD-10-CM

## 2013-06-16 DIAGNOSIS — C9002 Multiple myeloma in relapse: Secondary | ICD-10-CM

## 2013-06-16 MED ORDER — OXYCODONE HCL 5 MG PO TABS: 5.0000 mg | ORAL_TABLET | ORAL | Status: DC | PRN

## 2013-06-16 NOTE — Patient Instructions (Signed)
I agree with letting your doctors at [facility] know about your chest pain next week.  I suspect this is rib pain from the chest xray and EKG we did today but we couldn't get great pictures of your ribs since our machine was broken.  If the doctors at [facility] agree it is your ribs and would like to you follow-up with your primary care doctor for this, and if you are still having pain in 2-3 weeks, please come back to see me and we will discuss the next steps of getting you better.  If the pain changes or is getting worse at all come back immediately.  I agree with trying to stay away from over-the-counter pain medicines other than tylenol due to your kidney function.  Costochondritis Costochondritis (Tietze syndrome), or costochondral separation, is a swelling and irritation (inflammation) of the tissue (cartilage) that connects your ribs with your breastbone (sternum). It may occur on its own (spontaneously), through damage caused by an accident (trauma), or simply from coughing or minor exercise. It may take up to 6 weeks to get better and longer if you are unable to be conservative in your activities. HOME CARE INSTRUCTIONS   Avoid exhausting physical activity. Try not to strain your ribs during normal activity. This would include any activities using chest, belly (abdominal), and side muscles, especially if heavy weights are used.  Use ice for 15-20 minutes per hour while awake for the first 2 days. Place the ice in a plastic bag, and place a towel between the bag of ice and your skin.  Only take over-the-counter or prescription medicines for pain, discomfort, or fever as directed by your caregiver. SEEK IMMEDIATE MEDICAL CARE IF:   Your pain increases or you are very uncomfortable.  You have a fever.  You develop difficulty with your breathing.  You cough up blood.  You develop worse chest pains, shortness of breath, sweating, or vomiting.  You develop new, unexplained problems (symptoms). MAKE  SURE YOU:   Understand these instructions.  Will watch your condition.  Will get help right away if you are not doing well or get worse. Document Released: 05/13/2005 Document Revised: 10/26/2011 Document Reviewed: 03/21/2008 [facility] Patient Information 2014 [place], [place].

## 2013-06-16 NOTE — Progress Notes (Signed)
Subjective:    Patient ID: Arthur Valdez, male    DOB: 12-05-1932, 79 y.o.   MRN: [id number] Chief Complaint  Patient presents with  . Chest Pain    x 2 weeks    HPI  Under going chemo since 2007 for MM.   Causes pain in his large joints intermittently.  Went to ENT doctor in [place] on Monday - thought the pain might be due to his vocal cords or mucous in his esophagus.  Had bone scan on 10/8 also in [facility] but hasn't heard any results from this so assumes they were normal but not having the left sided CP at that time.  Having chest pain intermittently for the past 2 wks but has also had in the past but always went away on its own.  Feels better when he presses on it. Does not get worse when he coughs or takes a deep breath.  Seems to be worse when he stands up or when he turns over in the bed.  Has had a little cough but is at baseline.  Does feel SHoB at times but is unrelated to the pain.  Tried a few ibuprofen which helped initially but doesn't want to take due to MM and has some vicodin which helped initially but no longer though the tramadol does help.  Not exertional.  No h/o CAD.  Past Medical History  Diagnosis Date  . Multiple myeloma in relapse 07/16/2011  . Anemia of renal disease 07/16/2011  . Diabetes mellitus   . Hypertension   . Arthritis   . Anxiety   . Thyroid disease     Current Outpatient Prescriptions on File Prior to Visit  Medication Sig Dispense Refill  . acyclovir (ZOVIRAX) 400 MG tablet Take 1 tablet (400 mg total) by mouth daily.  30 tablet  3  . albuterol (PROVENTIL HFA) 108 (90 BASE) MCG/ACT inhaler Inhale 2 puffs into the lungs every 4 (four) hours as needed.  2 Inhaler  3  . amLODipine (NORVASC) 10 MG tablet Take 10 mg by mouth daily before breakfast.       . B Complex-C (B-COMPLEX WITH VITAMIN C) tablet Take 1 tablet by mouth daily.      . chlorpheniramine-HYDROcodone (TUSSIONEX PENNKINETIC ER) 10-8 MG/5ML LQCR Take 5 mLs by mouth at bedtime as needed  (cough).  60 mL  0  . clotrimazole (MYCELEX) 10 MG troche Take 1 tablet (10 mg total) by mouth 3 (three) times daily as needed. Mouth sore  90 tablet  0  . dexamethasone (DECADRON) 4 MG tablet Take 5 tablets once a week; 3 weeks on 1 week off; Repeat with each cycle of Pomalyst  45 tablet  3  . HYDROcodone-acetaminophen (VICODIN) 5-500 MG per tablet 1 tablet as needed.       Marland Kitchen ibuprofen (ADVIL,MOTRIN) 200 MG tablet Take 400 mg by mouth every 8 (eight) hours as needed. Pain      . insulin glargine (LANTUS) 100 UNIT/ML injection Inject 5 Units into the skin at bedtime. Pt only take if bs > 125      . levothyroxine (SYNTHROID, LEVOTHROID) 50 MCG tablet Take 50 mcg by mouth daily before breakfast.      . LORazepam (ATIVAN) 2 MG tablet Take 1 tablet (2 mg total) by mouth every 8 (eight) hours as needed for anxiety.  90 tablet  1  . omeprazole (PRILOSEC) 20 MG capsule Take 20 mg by mouth daily.      . ondansetron (ZOFRAN)  8 MG tablet Take 1 tablet (8 mg total) by mouth 2 (two) times daily. Take 1 tablet by mouth (8 mg ) in the am and one tablet by mouth in the pm beginning day 3 or Wednesday, 05/18/12 for 3 days total.  20 tablet  3  . promethazine (PHENERGAN) 25 MG tablet Take 0.5 tablets (12.5 mg total) by mouth every 6 (six) hours as needed for nausea.  30 tablet  0  . sitaGLIPtin (JANUVIA) 100 MG tablet Take 1 tablet (100 mg total) by mouth daily before breakfast.  30 tablet  0  . traMADol (ULTRAM) 50 MG tablet TAKE 1 TO 2 TABLETS EVERY 6 TO 8 HOURS AS NEEDED FOR PAIN  120 tablet  0  . temazepam (RESTORIL) 15 MG capsule Take 1 capsule (15 mg total) by mouth at bedtime as needed for sleep.  30 capsule  1  . [DISCONTINUED] prochlorperazine (COMPAZINE) 10 MG tablet Take 1 tablet (10 mg total) by mouth every 6 (six) hours as needed (Nausea or vomiting).  30 tablet  1   Current Facility-Administered Medications on File Prior to Visit  Medication Dose Route Frequency Provider Last Rate Last Dose  . sodium  chloride 0.9 % injection 10 mL  10 mL Intravenous PRN [name], MD   10 mL at 07/16/11 1002   No Known Allergies    Review of Systems  Constitutional: Negative for fever and chills.  Eyes: Negative for visual disturbance.  Respiratory: Positive for cough and shortness of breath. Negative for chest tightness and wheezing.   Cardiovascular: Positive for chest pain. Negative for leg swelling.  Musculoskeletal: Positive for arthralgias.  Neurological: Negative for dizziness, syncope, facial asymmetry, weakness, light-headedness and headaches.      BP 127/79  Pulse 104  Temp(Src) 97.4 F (36.3 C)  Resp 18  Ht 5\' 11"  (1.803 m)  Wt 224 lb (101.606 kg)  BMI 31.26 kg/m2 Objective:   Physical Exam  Constitutional: He is oriented to person, place, and time. He appears well-developed and well-nourished. No distress.  HENT:  Head: Normocephalic and atraumatic.  Eyes: Conjunctivae are normal. Pupils are equal, round, and reactive to light. No scleral icterus.  Neck: Normal range of motion. Neck supple. No thyromegaly present.  Cardiovascular: Normal rate, regular rhythm, normal heart sounds and intact distal pulses.   Pulmonary/Chest: Effort normal and breath sounds normal. No respiratory distress. He exhibits tenderness and bony tenderness. He exhibits no mass, no laceration, no crepitus, no edema, no deformity and no retraction.  Musculoskeletal: He exhibits no edema.  Lymphadenopathy:    He has no cervical adenopathy.  Neurological: He is alert and oriented to person, place, and time.  Skin: Skin is warm and dry. He is not diaphoretic.  Psychiatric: He has a normal mood and affect. His behavior is normal.     EKG: NSR, no ischemic changes CXR: no acute abnormality, RADIOLOGY OVERREAD: Buckle fracture of left lateral 7th rib - age indeterminate, no pneumothorax Assessment & Plan:   Chest pain, unspecified - Plan: DG Ribs Unilateral W/Chest Left, EKG 12-Lead  Fracture, rib,  left, closed, initial encounter - subacute rib fracture as a cause of his pain. Treat symptomatically. Warned against shallow breathing leading to atelectasis or infection. RTC if sxs worsen or develops cough or SHoB.  Rib pain on left side  Hypertension  Diabetes mellitus  Anemia of renal disease  Multiple myeloma in relapse  Unspecified hypothyroidism  Meds ordered this encounter  Medications  . oxyCODONE (  ROXICODONE) 5 MG immediate release tablet    Sig: Take 1 tablet (5 mg total) by mouth every 4 (four) hours as needed for pain.    Dispense:  40 tablet    Refill:  0    Norberto Sorenson, MD MPH

## 2013-07-05 ENCOUNTER — Telehealth: Payer: Self-pay | Admitting: *Deleted

## 2013-07-05 NOTE — Telephone Encounter (Signed)
On 07-04-13 [employer]  Medical records to judy lev /dr wang at [facility] , it was consult note, follow up notes, dos, check with Dr. Roselind Messier for the ok.

## 2013-07-23 ENCOUNTER — Ambulatory Visit (INDEPENDENT_AMBULATORY_CARE_PROVIDER_SITE_OTHER): Payer: Medicare PPO | Admitting: Family Medicine

## 2013-07-23 ENCOUNTER — Encounter (HOSPITAL_COMMUNITY): Payer: Self-pay | Admitting: Emergency Medicine

## 2013-07-23 ENCOUNTER — Inpatient Hospital Stay (HOSPITAL_COMMUNITY)
Admission: EM | Admit: 2013-07-23 | Discharge: 2013-07-27 | DRG: 871 | Disposition: A | Discharge status: Home Health | Payer: Medicare PPO | Attending: Family Medicine | Admitting: Family Medicine

## 2013-07-23 ENCOUNTER — Ambulatory Visit: Payer: Medicare PPO

## 2013-07-23 VITALS — BP 106/58 | HR 130 | Temp 101.5°F | Resp 22 | Ht 71.0 in | Wt 215.4 lb

## 2013-07-23 DIAGNOSIS — Z9221 Personal history of antineoplastic chemotherapy: Secondary | ICD-10-CM

## 2013-07-23 DIAGNOSIS — J189 Pneumonia, unspecified organism: Secondary | ICD-10-CM

## 2013-07-23 DIAGNOSIS — R0902 Hypoxemia: Secondary | ICD-10-CM

## 2013-07-23 DIAGNOSIS — D649 Anemia, unspecified: Secondary | ICD-10-CM

## 2013-07-23 DIAGNOSIS — Z833 Family history of diabetes mellitus: Secondary | ICD-10-CM

## 2013-07-23 DIAGNOSIS — N179 Acute kidney failure, unspecified: Secondary | ICD-10-CM | POA: Diagnosis present

## 2013-07-23 DIAGNOSIS — Z923 Personal history of irradiation: Secondary | ICD-10-CM

## 2013-07-23 DIAGNOSIS — A419 Sepsis, unspecified organism: Principal | ICD-10-CM | POA: Diagnosis present

## 2013-07-23 DIAGNOSIS — I129 Hypertensive chronic kidney disease with stage 1 through stage 4 chronic kidney disease, or unspecified chronic kidney disease: Secondary | ICD-10-CM | POA: Diagnosis present

## 2013-07-23 DIAGNOSIS — E039 Hypothyroidism, unspecified: Secondary | ICD-10-CM | POA: Diagnosis present

## 2013-07-23 DIAGNOSIS — F411 Generalized anxiety disorder: Secondary | ICD-10-CM | POA: Diagnosis present

## 2013-07-23 DIAGNOSIS — Z9484 Stem cells transplant status: Secondary | ICD-10-CM

## 2013-07-23 DIAGNOSIS — R197 Diarrhea, unspecified: Secondary | ICD-10-CM | POA: Diagnosis not present

## 2013-07-23 DIAGNOSIS — E119 Type 2 diabetes mellitus without complications: Secondary | ICD-10-CM | POA: Diagnosis present

## 2013-07-23 DIAGNOSIS — R509 Fever, unspecified: Secondary | ICD-10-CM

## 2013-07-23 DIAGNOSIS — C9000 Multiple myeloma not having achieved remission: Secondary | ICD-10-CM | POA: Diagnosis present

## 2013-07-23 DIAGNOSIS — R05 Cough: Secondary | ICD-10-CM

## 2013-07-23 DIAGNOSIS — Z794 Long term (current) use of insulin: Secondary | ICD-10-CM

## 2013-07-23 DIAGNOSIS — J9819 Other pulmonary collapse: Secondary | ICD-10-CM | POA: Diagnosis present

## 2013-07-23 DIAGNOSIS — D631 Anemia in chronic kidney disease: Secondary | ICD-10-CM | POA: Diagnosis present

## 2013-07-23 DIAGNOSIS — D61818 Other pancytopenia: Secondary | ICD-10-CM

## 2013-07-23 DIAGNOSIS — Z87891 Personal history of nicotine dependence: Secondary | ICD-10-CM

## 2013-07-23 DIAGNOSIS — Z79899 Other long term (current) drug therapy: Secondary | ICD-10-CM

## 2013-07-23 DIAGNOSIS — C9002 Multiple myeloma in relapse: Secondary | ICD-10-CM

## 2013-07-23 DIAGNOSIS — N189 Chronic kidney disease, unspecified: Secondary | ICD-10-CM | POA: Diagnosis present

## 2013-07-23 DIAGNOSIS — R059 Cough, unspecified: Secondary | ICD-10-CM

## 2013-07-23 DIAGNOSIS — D72819 Decreased white blood cell count, unspecified: Secondary | ICD-10-CM

## 2013-07-23 LAB — POCT CBC
Granulocyte percent: 65.6 %G (ref 37–80)
HCT, POC: 17.3 % — AB (ref 43.5–53.7)
Hemoglobin: 4.8 g/dL — AB (ref 14.1–18.1)
Lymph, poc: 0.7 (ref 0.6–3.4)
MCH, POC: 27 pg (ref 27–31.2)
MCHC: 27.7 g/dL — AB (ref 31.8–35.4)
MCV: 97 fL (ref 80–97)
MID (cbc): 0.3 (ref 0–0.9)
MPV: 9 fL (ref 0–99.8)
POC Granulocyte: 1.8 — AB (ref 2–6.9)
POC LYMPH PERCENT: 24.2 % (ref 10–50)
POC MID %: 10.2 %M (ref 0–12)
Platelet Count, POC: 70 10*3/uL — AB (ref 142–424)
RBC: 1.78 M/uL — AB (ref 4.69–6.13)
RDW, POC: 20.5 %
WBC: 2.8 10*3/uL — AB (ref 4.6–10.2)

## 2013-07-23 LAB — CBC WITH DIFFERENTIAL/PLATELET
Basophils Absolute: 0 10*3/uL (ref 0.0–0.1)
Basophils Relative: 0 % (ref 0–1)
Eosinophils Absolute: 0 10*3/uL (ref 0.0–0.7)
Eosinophils Relative: 0 % (ref 0–5)
HCT: 27.9 % — ABNORMAL LOW (ref 39.0–52.0)
Hemoglobin: 9 g/dL — ABNORMAL LOW (ref 13.0–17.0)
Lymphocytes Relative: 6 % — ABNORMAL LOW (ref 12–46)
Lymphs Abs: 0.2 10*3/uL — ABNORMAL LOW (ref 0.7–4.0)
MCH: 28.4 pg (ref 26.0–34.0)
MCHC: 32.3 g/dL (ref 30.0–36.0)
MCV: 88 fL (ref 78.0–100.0)
Monocytes Absolute: 0.4 10*3/uL (ref 0.1–1.0)
Monocytes Relative: 10 % (ref 3–12)
Neutro Abs: 3.2 10*3/uL (ref 1.7–7.7)
Neutrophils Relative %: 85 % — ABNORMAL HIGH (ref 43–77)
Platelets: 188 10*3/uL (ref 150–400)
RBC: 3.17 MIL/uL — ABNORMAL LOW (ref 4.22–5.81)
RDW: 17.2 % — ABNORMAL HIGH (ref 11.5–15.5)
WBC: 3.8 10*3/uL — ABNORMAL LOW (ref 4.0–10.5)

## 2013-07-23 LAB — COMPREHENSIVE METABOLIC PANEL WITH GFR
ALT: 5 U/L (ref 0–53)
AST: 13 U/L (ref 0–37)
Albumin: 3.3 g/dL — ABNORMAL LOW (ref 3.5–5.2)
Alkaline Phosphatase: 57 U/L (ref 39–117)
BUN: 20 mg/dL (ref 6–23)
CO2: 21 meq/L (ref 19–32)
Calcium: 8.6 mg/dL (ref 8.4–10.5)
Chloride: 99 meq/L (ref 96–112)
Creatinine, Ser: 2.27 mg/dL — ABNORMAL HIGH (ref 0.50–1.35)
GFR calc Af Amer: 30 mL/min — ABNORMAL LOW
GFR calc non Af Amer: 26 mL/min — ABNORMAL LOW
Glucose, Bld: 152 mg/dL — ABNORMAL HIGH (ref 70–99)
Potassium: 3.3 meq/L — ABNORMAL LOW (ref 3.5–5.1)
Sodium: 134 meq/L — ABNORMAL LOW (ref 135–145)
Total Bilirubin: 0.8 mg/dL (ref 0.3–1.2)
Total Protein: 6 g/dL (ref 6.0–8.3)

## 2013-07-23 LAB — POCT I-STAT, CHEM 8
BUN: 19 mg/dL (ref 6–23)
Calcium, Ion: 1.14 mmol/L (ref 1.13–1.30)
Chloride: 102 meq/L (ref 96–112)
Creatinine, Ser: 2.5 mg/dL — ABNORMAL HIGH (ref 0.50–1.35)
Glucose, Bld: 161 mg/dL — ABNORMAL HIGH (ref 70–99)
HCT: 25 % — ABNORMAL LOW (ref 39.0–52.0)
Hemoglobin: 8.5 g/dL — ABNORMAL LOW (ref 13.0–17.0)
Potassium: 3.5 meq/L (ref 3.5–5.1)
Sodium: 137 meq/L (ref 135–145)
TCO2: 20 mmol/L (ref 0–100)

## 2013-07-23 LAB — POCT INFLUENZA A/B
Influenza A, POC: NEGATIVE
Influenza B, POC: NEGATIVE

## 2013-07-23 LAB — CG4 I-STAT (LACTIC ACID): Lactic Acid, Venous: 1.47 mmol/L (ref 0.5–2.2)

## 2013-07-23 LAB — GLUCOSE, CAPILLARY: Glucose-Capillary: 135 mg/dL — ABNORMAL HIGH (ref 70–99)

## 2013-07-23 MED ORDER — LORAZEPAM 1 MG PO TABS
2.0000 mg | ORAL_TABLET | Freq: Three times a day (TID) | ORAL | Status: DC | PRN
  Administered 2013-07-25: 2 mg via ORAL
  Filled 2013-07-23: qty 2

## 2013-07-23 MED ORDER — SODIUM CHLORIDE 0.9 % IV SOLN
INTRAVENOUS | Status: AC
  Administered 2013-07-23: 21:00:00 via INTRAVENOUS

## 2013-07-23 MED ORDER — PROMETHAZINE HCL 12.5 MG PO TABS
12.5000 mg | ORAL_TABLET | Freq: Four times a day (QID) | ORAL | Status: DC | PRN
  Administered 2013-07-24 – 2013-07-27 (×4): 12.5 mg via ORAL
  Filled 2013-07-23 (×4): qty 1

## 2013-07-23 MED ORDER — PANTOPRAZOLE SODIUM 40 MG PO TBEC
40.0000 mg | DELAYED_RELEASE_TABLET | Freq: Every day | ORAL | Status: DC
  Administered 2013-07-24 – 2013-07-27 (×4): 40 mg via ORAL
  Filled 2013-07-23 (×4): qty 1

## 2013-07-23 MED ORDER — FAMOTIDINE 20 MG PO TABS
20.0000 mg | ORAL_TABLET | Freq: Every day | ORAL | Status: DC
  Administered 2013-07-23 – 2013-07-26 (×4): 20 mg via ORAL
  Filled 2013-07-23 (×5): qty 1

## 2013-07-23 MED ORDER — ONDANSETRON HCL 4 MG/2ML IJ SOLN: 4.0000 mg | Freq: Three times a day (TID) | INTRAMUSCULAR | Status: AC | PRN

## 2013-07-23 MED ORDER — HEPARIN SODIUM (PORCINE) 5000 UNIT/ML IJ SOLN
5000.0000 [IU] | Freq: Three times a day (TID) | INTRAMUSCULAR | Status: DC
  Administered 2013-07-23 – 2013-07-27 (×11): 5000 [IU] via SUBCUTANEOUS
  Filled 2013-07-23 (×14): qty 1

## 2013-07-23 MED ORDER — SODIUM CHLORIDE 0.9 % IV SOLN: Freq: Once | INTRAVENOUS | Status: DC

## 2013-07-23 MED ORDER — VANCOMYCIN HCL IN DEXTROSE 1-5 GM/200ML-% IV SOLN
1000.0000 mg | Freq: Once | INTRAVENOUS | Status: AC
  Administered 2013-07-23: 1000 mg via INTRAVENOUS
  Filled 2013-07-23 (×2): qty 200

## 2013-07-23 MED ORDER — TRAMADOL HCL 50 MG PO TABS: 50.0000 mg | ORAL_TABLET | Freq: Two times a day (BID) | ORAL | Status: DC | PRN

## 2013-07-23 MED ORDER — BENZONATATE 100 MG PO CAPS
200.0000 mg | ORAL_CAPSULE | Freq: Three times a day (TID) | ORAL | Status: DC | PRN
  Administered 2013-07-23 – 2013-07-27 (×6): 200 mg via ORAL
  Filled 2013-07-23 (×6): qty 2

## 2013-07-23 MED ORDER — INSULIN ASPART 100 UNIT/ML ~~LOC~~ SOLN
0.0000 [IU] | Freq: Three times a day (TID) | SUBCUTANEOUS | Status: DC
  Administered 2013-07-24: 2 [IU] via SUBCUTANEOUS
  Administered 2013-07-24 – 2013-07-27 (×2): 1 [IU] via SUBCUTANEOUS

## 2013-07-23 MED ORDER — SODIUM CHLORIDE 0.9 % IV BOLUS (SEPSIS)
1000.0000 mL | Freq: Once | INTRAVENOUS | Status: AC
  Administered 2013-07-23: 1000 mL via INTRAVENOUS

## 2013-07-23 MED ORDER — OXYCODONE HCL 5 MG PO TABS: 5.0000 mg | ORAL_TABLET | ORAL | Status: DC | PRN

## 2013-07-23 MED ORDER — ALBUTEROL SULFATE HFA 108 (90 BASE) MCG/ACT IN AERS
2.0000 | INHALATION_SPRAY | RESPIRATORY_TRACT | Status: DC | PRN
  Filled 2013-07-23: qty 6.7

## 2013-07-23 MED ORDER — PIPERACILLIN-TAZOBACTAM 3.375 G IVPB
3.3750 g | Freq: Once | INTRAVENOUS | Status: AC
  Administered 2013-07-23: 3.375 g via INTRAVENOUS
  Filled 2013-07-23 (×3): qty 50

## 2013-07-23 MED ORDER — LEVOFLOXACIN IN D5W 750 MG/150ML IV SOLN: 750.0000 mg | Freq: Once | INTRAVENOUS | Status: DC

## 2013-07-23 MED ORDER — SODIUM CHLORIDE 0.9 % IJ SOLN
10.0000 mL | INTRAMUSCULAR | Status: DC | PRN
  Administered 2013-07-23: 20 mL
  Administered 2013-07-27: 10 mL

## 2013-07-23 MED ORDER — ACETAMINOPHEN 325 MG PO TABS: 650.0000 mg | ORAL_TABLET | ORAL | Status: AC

## 2013-07-23 MED ORDER — ACETAMINOPHEN 325 MG PO TABS
1000.0000 mg | ORAL_TABLET | Freq: Once | ORAL | Status: AC
  Administered 2013-07-23: 975 mg via ORAL

## 2013-07-23 NOTE — ED Notes (Signed)
IV team at bedside to access pt port

## 2013-07-23 NOTE — ED Provider Notes (Signed)
CSN: [id number]     Arrival date & time 07/23/13  1834 History   First MD Initiated Contact with Patient 07/23/13 1841     Chief Complaint  Patient presents with  . Fever   (Consider location/radiation/quality/duration/timing/severity/associated sxs/prior Treatment) HPI Comments: 79 year old male with a history of hypertension, multiple myeloma who is currently followed at [facility] for radiation and chemotherapy. He has not had chemotherapy in over one month, he has just finished his last dose of radiation on Tuesday. He has had a cough for the last 3 days which is persistent, gradually worsening, associated with fever and a yellow sputum. He denies swelling, back pain, chest pain, sore throat. He has significant other with similar symptoms in other family members who have upper respiratory symptoms as well.   He was seen at the urgent care prior to arrival when they found his hemoglobin to be 4-1/2 and his white blood cell count to be 2.8. Chest x-ray at that time showed a right middle lobe or right lower lobe streaky pneumonia.  Patient is a 79 y.o. male presenting with fever. The history is provided by the patient and the spouse.  Fever   Past Medical History  Diagnosis Date  . Multiple myeloma in relapse 07/16/2011  . Anemia of renal disease 07/16/2011  . Diabetes mellitus   . Hypertension   . Arthritis   . Anxiety   . Thyroid disease    Past Surgical History  Procedure Laterality Date  . Joint replacement    . Prostate surgery     Family History  Problem Relation Age of Onset  . Cancer Brother   . Diabetes Brother    History  Substance Use Topics  . Smoking status: Former Smoker    Quit date: 11/04/2001  . Smokeless tobacco: Never Used  . Alcohol Use: Yes    Review of Systems  Constitutional: Positive for fever.  All other systems reviewed and are negative.    Allergies  Raspberry  Home Medications   Current Outpatient Rx  Name  Route  Sig   Dispense  Refill  . albuterol (PROVENTIL HFA) 108 (90 BASE) MCG/ACT inhaler   Inhalation   Inhale 2 puffs into the lungs every 4 (four) hours as needed.   2 Inhaler   3   . amLODipine (NORVASC) 10 MG tablet   Oral   Take 10 mg by mouth daily before breakfast.          . B Complex-C (B-COMPLEX WITH VITAMIN C) tablet   Oral   Take 1 tablet by mouth daily.         . clotrimazole (MYCELEX) 10 MG troche   Oral   Take 1 tablet (10 mg total) by mouth 3 (three) times daily as needed. Mouth sore   90 tablet   0   . insulin glargine (LANTUS) 100 UNIT/ML injection   Subcutaneous   Inject 5 Units into the skin daily as needed. Just take it when sugar is up per patient         . LORazepam (ATIVAN) 2 MG tablet   Oral   Take 1 tablet (2 mg total) by mouth every 8 (eight) hours as needed for anxiety.   90 tablet   1   . omeprazole (PRILOSEC) 20 MG capsule   Oral   Take 20 mg by mouth 2 (two) times daily.          . ondansetron (ZOFRAN) 8 MG tablet   Oral  Take 1 tablet (8 mg total) by mouth 2 (two) times daily. Take 1 tablet by mouth (8 mg ) in the am and one tablet by mouth in the pm beginning day 3 or Wednesday, 05/18/12 for 3 days total.   20 tablet   3   . oxyCODONE (ROXICODONE) 5 MG immediate release tablet   Oral   Take 1 tablet (5 mg total) by mouth every 4 (four) hours as needed for pain.   40 tablet   0   . promethazine (PHENERGAN) 25 MG tablet   Oral   Take 0.5 tablets (12.5 mg total) by mouth every 6 (six) hours as needed for nausea.   30 tablet   0   . ranitidine (ZANTAC) 150 MG capsule   Oral   Take 150 mg by mouth at bedtime.         . sitaGLIPtin (JANUVIA) 100 MG tablet   Oral   Take 1 tablet (100 mg total) by mouth daily before breakfast.   30 tablet   0     Needs office visit   . traMADol (ULTRAM) 50 MG tablet      TAKE 1 TO 2 TABLETS EVERY 6 TO 8 HOURS AS NEEDED FOR PAIN   120 tablet   0   . EXPIRED: insulin glargine (LANTUS) 100  UNIT/ML injection   Subcutaneous   Inject 5 Units into the skin at bedtime. Pt only take if bs > 125         . EXPIRED: levothyroxine (SYNTHROID, LEVOTHROID) 50 MCG tablet   Oral   Take 50 mcg by mouth daily before breakfast.          BP 92/53  Temp(Src) 99.8 F (37.7 C) (Oral)  Resp 20  SpO2 99% Physical Exam  Nursing note and vitals reviewed. Constitutional: He appears well-developed and well-nourished.  Uncomfortable appearing  HENT:  Head: Normocephalic and atraumatic.  Mouth/Throat: Oropharynx is clear and moist. No oropharyngeal exudate.  Eyes: Conjunctivae and EOM are normal. Pupils are equal, round, and reactive to light. Right eye exhibits no discharge. Left eye exhibits no discharge. No scleral icterus.  Neck: Normal range of motion. Neck supple. No JVD present. No thyromegaly present.  Cardiovascular: Regular rhythm, normal heart sounds and intact distal pulses.  Exam reveals no gallop and no friction rub.   No murmur heard. Tachycardic to 130  Pulmonary/Chest: Effort normal. No respiratory distress. He has no wheezes. He has rales ( Rales at the right base, slight rales at the left legs that clear with coughing, no wheezing, and slight increased work of breathing).  Abdominal: Soft. Bowel sounds are normal. He exhibits no distension and no mass. There is no tenderness.  Musculoskeletal: Normal range of motion. He exhibits no edema and no tenderness.  Lymphadenopathy:    He has no cervical adenopathy.  Neurological: He is alert. Coordination normal.  Skin: Skin is warm and dry. No rash noted. No erythema.  Psychiatric: He has a normal mood and affect. His behavior is normal.    ED Course  Procedures (including critical care time) Labs Review Labs Reviewed  CBC WITH DIFFERENTIAL   Imaging Review Dg Chest 2 View  07/23/2013   CLINICAL DATA:  Cough, shortness of Breath  EXAM: CHEST  2 VIEW  COMPARISON:  06/16/2013  FINDINGS: Cardiomediastinal silhouette is  stable. Right Port-A-Cath is unchanged in position. There is streaky right middle lobe atelectasis or infiltrate. No pulmonary edema.  IMPRESSION: Streaky right middle lobe atelectasis or  infiltrate.   Electronically Signed   By: Natasha Mead M.D.   On: 07/23/2013 18:47    EKG Interpretation    Date/Time:  Sunday July 23 2013 18:51:45 EST Ventricular Rate:  121 PR Interval:  126 QRS Duration: 85 QT Interval:  317 QTC Calculation: 450 R Axis:   12 Text Interpretation:  Sinus tachycardia Nonspecific T abnormalities, lateral leads Baseline wander in lead(s) II III aVF V6 Since last tracing rate faster q wave now present in inferior lead Confirmed by Niomi Valent  MD, Daniesha Driver ([id number]) on 07/23/2013 7:09:19 PM            MDM   1. Sepsis   2. HCAP (healthcare-associated pneumonia)   3. Anemia   4. Leukopenia    The patient does have a fever, tachycardia and appears to have an infiltrate on his x-ray. I have personally seen and interpret the x-ray and agreed that there is a right lower lobe or right middle lobe infiltrate. His hemoglobin reportedly was below 5, I will confirm this with repeat blood testing, I will also get a lactic acid, basic metabolic panel and plan to admit the patient to the hospital. I have ordered antibiotics, IV fluids, antipyretics.  Discussed care with the family practice resident, he agrees that the patient needs to be admitted to the hospital. The patient is hypotensive at this time, requiring IV fluid bolus resuscitation. Critical care is being delivered in this patient with a potentially life-threatening infection.  CRITICAL CARE Performed by: Vida Roller Total critical care time: 35 Critical care time was exclusive of separately billable procedures and treating other patients. Critical care was necessary to treat or prevent imminent or life-threatening deterioration. Critical care was time spent personally by me on the following activities: development of  treatment plan with patient and/or surrogate as well as nursing, discussions with consultants, evaluation of patient's response to treatment, examination of patient, obtaining history from patient or surrogate, ordering and performing treatments and interventions, ordering and review of laboratory studies, ordering and review of radiographic studies, pulse oximetry and re-evaluation of patient's condition.  Meds given in ED:  Medications  0.9 %  sodium chloride infusion (not administered)  acetaminophen (TYLENOL) tablet 650 mg (not administered)  vancomycin (VANCOCIN) IVPB 1000 mg/200 mL premix (not administered)  piperacillin-tazobactam (ZOSYN) IVPB 3.375 g (not administered)  sodium chloride 0.9 % bolus 1,000 mL (not administered)        Vida Roller, MD 07/23/13 1909

## 2013-07-23 NOTE — ED Notes (Addendum)
Pt to ED via [facility] from [facility] for further evaluation of fever and cough.  Urgent Care found pt hemoglobin to be low- pt transferred here for treatment.  Pt has hx of multiple myeloma- just finished 10 day radiation treatment on Tuesday- pt also gets chemotherapy once a month as treatment.

## 2013-07-23 NOTE — H&P (Signed)
Family Medicine [facility] Admission History and Physical Service Pager: [phone]  Patient name: Arthur Valdez Medical record number: [id number] Date of birth: 1933-06-02 Age: 79 y.o. Gender: male  Primary Care Provider: Tally Due, MD Consultants: None Code Status: Full  Chief Complaint: Cough, Congestion, Fatigue  Assessment and Plan: Arthur Valdez is a 79 y.o. male presenting with sepsis presumed due to HCAP, but patient is at higher risk for opportunistic infections given his immunocompromised status from active multiple myeloma under active treatment. PMH is significant for chronic kidney disease, anemia, and diabetes.   #Sepsis, Severe - Patient presented with fever to 101.3, HR 130, and BP of 92/53 with noted RML infiltrate on chest X-ray. No other nidus of infection noted on history or exam. Evidence of acute kidney injury but normal lactic acid. Pt undergoing active cancer treatment at [facility] for multiple myeloma, and denies any history of severe infections during treatment.  - Obtain blood and urine culture - Start Vanc and Zosyn for broad coverage bacterial infections and HCAP - Given NS bolus for blood pressure improvement (although this might not be an acute drop in BP) - If pt become hypoxemic or has tachy after rehydration, then obtain CTA to r/o PE - Admit to step down unit and consult PCCM if condition deteriorates   # Acute on Chronic Kidney Disease - Admission creat 2.3 with GFR 30, baseline creat 1.5; despite normal BUN, presumed pre-renal due to decreased BP and low PO intake - Rehydrate and assess creat in AM  # Hypertension - Pt with chronic HTN on amlodipine, but taking BP at home he notes that it was "low" in the 90's systolic last week - Hold amlodipine    # Multiple Myeloma - Free lambnda light chain, first diagnosed in 2007 and s/p numerous rounds of chemotherapy as well as autologous stem cell transplant in 2008; under current care at [facility]  by Heme-Onc and Rad-Onc departments with new lytic lesion of right femoral neck - Contact Oncologist to notify of admission to [facility] - Cont oxycodone PRN for pain  # Anemia, Normocytic - Hgb inaccurately measures as 4.8 on initial CBC, but true reading likely close to 9.0, which is baseline; presumed due to chronic kidney disease from other notes - Type and Screen Obtained - Cont to trend and if needed transfuse irradiated and leukocyte reduced   # Diabetes, Type 2 - Last A1C 6.3 in Feb 2014 - Hold home januvia and Lantus - Start sensitive sliding scale - Recheck A1C  # Anxiety - Cont home benzo q 8 PRN, watch for low BP  FEN/GI: Heart healthy diet, NS @ 125 after bolus Prophylaxis: Heparin SQ TID  Disposition: admit to step down   History of Present Illness: Arthur Valdez is a 79 y.o. male presenting with cough, congestion and malaise of several days duration. He originally thought his fatigue was due to active cancer treatment at [facility] (undergoing radiation and chemo) for multiple myeloma. However, he started to develop persistent coughing and went to his PCP office where he was found to have a fever and tachycardia. An X-ray was obtained that demonstrated RML infiltrate. Therefore, he was sent to the ED at [facility] via ambulance. He notes that he had increasing shortness of breath with exertion, but not at rest. He also has had decreased PO intake for several days. He denies chest pain, nausea, vomiting, diarrhea, rash or sores on his skin. He lives with his wife, daughter and  two granddaughters who all have viral type URI symptoms. The patient notes that he is up to date on immunizations including flu and pneumonia. He also tells me that he takes his blood pressure daily and for the last week it has been "low" in the 90's-100's systolic.   Pt accompanied by wife who denies any altered mental status this week.    Review Of Systems: Per HPI with the following additions: Denies swelling of  feet and leg Otherwise 12 point review of systems was performed and was unremarkable.  Patient Active Problem List   Diagnosis Date Noted  . Sepsis 07/23/2013  . Unspecified hypothyroidism 06/16/2013  . Diabetes mellitus 12/09/2011  . Hypertension 12/09/2011  . Multiple myeloma in relapse 07/16/2011  . Anemia of renal disease 07/16/2011   Past Medical History: Past Medical History  Diagnosis Date  . Multiple myeloma in relapse 07/16/2011  . Anemia of renal disease 07/16/2011  . Diabetes mellitus   . Hypertension   . Arthritis   . Anxiety   . Thyroid disease    Past Surgical History: Past Surgical History  Procedure Laterality Date  . Joint replacement    . Prostate surgery     Social History: History  Substance Use Topics  . Smoking status: Former Smoker    Quit date: 11/04/2001  . Smokeless tobacco: Never Used  . Alcohol Use: Yes   Additional social history: none Please also refer to relevant sections of EMR.  Family History: Family History  Problem Relation Age of Onset  . Cancer Brother   . Diabetes Brother    Allergies and Medications: Allergies  Allergen Reactions  . Raspberry     hives   Current Facility-Administered Medications on File Prior to Encounter  Medication Dose Route Frequency Provider Last Rate Last Dose  . sodium chloride 0.9 % injection 10 mL  10 mL Intravenous PRN [name], MD   10 mL at 07/16/11 1002   Current Outpatient Prescriptions on File Prior to Encounter  Medication Sig Dispense Refill  . albuterol (PROVENTIL HFA) 108 (90 BASE) MCG/ACT inhaler Inhale 2 puffs into the lungs every 4 (four) hours as needed.  2 Inhaler  3  . amLODipine (NORVASC) 10 MG tablet Take 10 mg by mouth daily before breakfast.       . B Complex-C (B-COMPLEX WITH VITAMIN C) tablet Take 1 tablet by mouth daily.      . clotrimazole (MYCELEX) 10 MG troche Take 1 tablet (10 mg total) by mouth 3 (three) times daily as needed. Mouth sore  90 tablet  0  .  LORazepam (ATIVAN) 2 MG tablet Take 1 tablet (2 mg total) by mouth every 8 (eight) hours as needed for anxiety.  90 tablet  1  . omeprazole (PRILOSEC) 20 MG capsule Take 20 mg by mouth 2 (two) times daily.       . ondansetron (ZOFRAN) 8 MG tablet Take 1 tablet (8 mg total) by mouth 2 (two) times daily. Take 1 tablet by mouth (8 mg ) in the am and one tablet by mouth in the pm beginning day 3 or Wednesday, 05/18/12 for 3 days total.  20 tablet  3  . oxyCODONE (ROXICODONE) 5 MG immediate release tablet Take 1 tablet (5 mg total) by mouth every 4 (four) hours as needed for pain.  40 tablet  0  . promethazine (PHENERGAN) 25 MG tablet Take 0.5 tablets (12.5 mg total) by mouth every 6 (six) hours as needed for nausea.  30 tablet  0  . sitaGLIPtin (JANUVIA) 100 MG tablet Take 1 tablet (100 mg total) by mouth daily before breakfast.  30 tablet  0  . traMADol (ULTRAM) 50 MG tablet TAKE 1 TO 2 TABLETS EVERY 6 TO 8 HOURS AS NEEDED FOR PAIN  120 tablet  0  . insulin glargine (LANTUS) 100 UNIT/ML injection Inject 5 Units into the skin at bedtime. Pt only take if bs > 125      . levothyroxine (SYNTHROID, LEVOTHROID) 50 MCG tablet Take 50 mcg by mouth daily before breakfast.      . [DISCONTINUED] prochlorperazine (COMPAZINE) 10 MG tablet Take 1 tablet (10 mg total) by mouth every 6 (six) hours as needed (Nausea or vomiting).  30 tablet  1    Objective: BP 92/53  Temp(Src) 99.8 F (37.7 C) (Oral)  Resp 20  SpO2 99% Exam: General: elderly AAM, appears younger than stated age, ill but non toxic appearing, pleasant and conversant HEENT: NCAT, PERRLA, dentures in place, no sinus tenderness, normal TM, OP dry, no lymphadenopathy, no meningismus Cardiovascular: RRR, no murmurs, 2+ radial pulses but difficulty to palpate DP's; < 3 sec cap refill  Respiratory: normal work of breathing, no wheezes, rhonchi or rales, no dullness to percussion Abdomen: soft, NDNT, NABS Extremities: non tender, no edema Skin: warm, dry,  intact without rashes or lesions Neuro: AXOx 4, no focal deficits   Labs and Imaging: CBC BMET   Recent Labs Lab 07/23/13 1711  WBC 2.8*  HGB 4.8*  HCT 17.3*   No results found for this basename: NA, K, CL, CO2, BUN, CREATININE, GLUCOSE, CALCIUM,  in the last 168 hours   Lactic Acid - 1.47  Dg Chest 2 View  07/23/2013   CLINICAL DATA:  Cough, shortness of Breath  EXAM: CHEST  2 VIEW  COMPARISON:  06/16/2013  FINDINGS: Cardiomediastinal silhouette is stable. Right Port-A-Cath is unchanged in position. There is streaky right middle lobe atelectasis or infiltrate. No pulmonary edema.  IMPRESSION: Streaky right middle lobe atelectasis or infiltrate.   Electronically Signed   By: Natasha Mead M.D.   On: 07/23/2013 18:47     Garnetta Buddy, MD 07/23/2013, 7:18 PM PGY-3, [facility] FPTS Intern pager: [phone], text pages welcome

## 2013-07-23 NOTE — ED Notes (Signed)
Pt not given 650 tylenol due to being given 500 mg of tylenol at urgent care.

## 2013-07-23 NOTE — ED Notes (Signed)
Clinton Sawyer, MD states that he wants blood cultures and urine drawn before antibiotics started.

## 2013-07-23 NOTE — Progress Notes (Signed)
Chief Complaint:  Chief Complaint  Patient presents with  . Shortness of Breath    started yesterday  . Cough    HPI: Arthur Valdez is a 79 y.o. male who is here for  1 day history of acute SOB, coughing, fever, feeling tired in shoulders Has a h/o multiple myeloma relapse, Dr Burna Cash Has fever since this AM, took Tylenol here in office Productive green cough, no CP, no palpitatons Recently had URI at the end of October per patients's wife, had resolving  lower Right infiltrate PNA in 07/2012 based on xray Has had flu vaccine for 2014  Past Medical History  Diagnosis Date  . Multiple myeloma in relapse 07/16/2011  . Anemia of renal disease 07/16/2011  . Diabetes mellitus   . Hypertension   . Arthritis   . Anxiety   . Thyroid disease    Past Surgical History  Procedure Laterality Date  . Joint replacement    . Prostate surgery     History   Social History  . Marital Status: Married    Spouse Name: N/A    Number of Children: N/A  . Years of Education: N/A   Social History Main Topics  . Smoking status: Former Smoker    Quit date: 11/04/2001  . Smokeless tobacco: Never Used  . Alcohol Use: Yes  . Drug Use: No  . Sexual Activity: Yes    Birth Control/ Protection: None   Other Topics Concern  . None   Social History Narrative  . None   Family History  Problem Relation Age of Onset  . Cancer Brother   . Diabetes Brother    No Known Allergies Prior to Admission medications   Medication Sig Start Date End Date Taking? Authorizing Provider  acyclovir (ZOVIRAX) 400 MG tablet Take 1 tablet (400 mg total) by mouth daily. 05/16/12  Yes [name], MD  albuterol (PROVENTIL HFA) 108 (90 BASE) MCG/ACT inhaler Inhale 2 puffs into the lungs every 4 (four) hours as needed. 07/26/12  Yes Peyton Najjar, MD  amLODipine (NORVASC) 10 MG tablet Take 10 mg by mouth daily before breakfast.    Yes Historical Provider, MD  B Complex-C (B-COMPLEX WITH VITAMIN C)  tablet Take 1 tablet by mouth daily.   Yes Historical Provider, MD  clotrimazole (MYCELEX) 10 MG troche Take 1 tablet (10 mg total) by mouth 3 (three) times daily as needed. Mouth sore 04/20/12  Yes [name], MD  dexamethasone (DECADRON) 4 MG tablet Take 5 tablets once a week; 3 weeks on 1 week off; Repeat with each cycle of Pomalyst 12/09/11  Yes [name], MD  LORazepam (ATIVAN) 2 MG tablet Take 1 tablet (2 mg total) by mouth every 8 (eight) hours as needed for anxiety. 10/18/12  Yes Eunice Blase, PA-C  omeprazole (PRILOSEC) 20 MG capsule Take 20 mg by mouth daily.   Yes Historical Provider, MD  ondansetron (ZOFRAN) 8 MG tablet Take 1 tablet (8 mg total) by mouth 2 (two) times daily. Take 1 tablet by mouth (8 mg ) in the am and one tablet by mouth in the pm beginning day 3 or Wednesday, 05/18/12 for 3 days total. 05/16/12  Yes [name], MD  oxyCODONE (ROXICODONE) 5 MG immediate release tablet Take 1 tablet (5 mg total) by mouth every 4 (four) hours as needed for pain. 06/16/13  Yes Sherren Mocha, MD  promethazine (PHENERGAN) 25 MG tablet Take 0.5 tablets (12.5  mg total) by mouth every 6 (six) hours as needed for nausea. 05/16/12  Yes [name], MD  sitaGLIPtin (JANUVIA) 100 MG tablet Take 1 tablet (100 mg total) by mouth daily before breakfast. 05/31/12  Yes Ryan M Dunn, PA-C  traMADol (ULTRAM) 50 MG tablet TAKE 1 TO 2 TABLETS EVERY 6 TO 8 HOURS AS NEEDED FOR PAIN 04/07/12  Yes [name], MD  insulin glargine (LANTUS) 100 UNIT/ML injection Inject 5 Units into the skin at bedtime. Pt only take if bs > 125 11/09/11 06/16/13  Lamar Laundry, MD  levothyroxine (SYNTHROID, LEVOTHROID) 50 MCG tablet Take 50 mcg by mouth daily before breakfast. 11/05/11 06/16/13  Lamar Laundry, MD     ROS: The patient denies  night sweats, unintentional weight loss, chest pain, palpitations, nausea, vomiting, abdominal pain, dysuria, hematuria, melena, numbness, , or tingling.   All other systems have  been reviewed and were otherwise negative with the exception of those mentioned in the HPI and as above.    PHYSICAL EXAM: Filed Vitals:   07/23/13 1634  BP: 106/58  Pulse: 130  Temp: 101.5 F (38.6 C)  Resp: 22  Spo2 92-93%, 97-100% on 2 L Filed Vitals:   07/23/13 1634  Height: 5\' 11"  (1.803 m)  Weight: 215 lb 6.4 oz (97.705 kg)   Body mass index is 30.06 kg/(m^2).  General: Alert, no acute distress, tired appearing HEENT:  Normocephalic, atraumatic, oropharynx patent. EOMI, PERRLA Cardiovascular:  Regular rate and rhythm, no rubs murmurs or gallops.  No Carotid bruits, radial pulse intact. No pedal edema.  Respiratory: Clear to auscultation bilaterally.  No wheezes, rales, or rhonchi.  No cyanosis, no use of accessory musculature GI: No organomegaly, abdomen is soft and non-tender, positive bowel sounds.  No masses. Skin: No rashes. Neurologic: Facial musculature symmetric. Psychiatric: Patient is appropriate throughout our interaction. Lymphatic: No cervical lymphadenopathy Musculoskeletal: Uses walker, steady gait with walker   LABS: Results for orders placed in visit on 07/23/13  POCT CBC      Result Value Range   WBC 2.8 (*) 4.6 - 10.2 K/uL   Lymph, poc 0.7  0.6 - 3.4   POC LYMPH PERCENT 24.2  10 - 50 %L   MID (cbc) 0.3  0 - 0.9   POC MID % 10.2  0 - 12 %M   POC Granulocyte 1.8 (*) 2 - 6.9   Granulocyte percent 65.6  37 - 80 %G   RBC 1.78 (*) 4.69 - 6.13 M/uL   Hemoglobin 4.8 (*) 14.1 - 18.1 g/dL   HCT, POC 86.5 (*) 78.4 - 53.7 %   MCV 97.0  80 - 97 fL   MCH, POC 27.0  27 - 31.2 pg   MCHC 27.7 (*) 31.8 - 35.4 g/dL   RDW, POC 69.6     Platelet Count, POC 70 (*) 142 - 424 K/uL   MPV 9.0  0 - 99.8 fL  POCT INFLUENZA A/B      Result Value Range   Influenza A, POC Negative     Influenza B, POC Negative       EKG/XRAY:   Primary read interpreted by [facility]. Conley Rolls at [facility]. Recurring right lower lobe  Infiltrate, worse in appearance than  06/16/2013   ASSESSMENT/PLAN: Encounter Diagnoses  Name Primary?  . Fever, unspecified   . Cough   . Pneumonia Yes  . Pancytopenia   . Multiple myeloma in relapse   . Hypoxia    79 y/o male  with history of relapsing multiple myeloma ( cancer specialist Burna Cash, he may have been referred to [facility] in the past) with h/o diabetes presents with an acute 1 day history of SOB/DOE, green productive cough, and fever T max 101.5 this morning. He has not taken anything for it. In the past year his hgb has been stable around 8-10. Today in office hgb was 4.8. Xray shows possible recurring right lower lobe infiltrate.  Patient will be sent to [facility] by EMS for further evaluation Spoke with Dr Curly Rim sideeffects, risk and benefits, and alternatives of medications d/w patient. Patient is aware that all medications have potential sideeffects and we are unable to predict every sideeffect or drug-drug interaction that may occur.  Hamilton Capri PHUONG, DO 07/23/2013 5:56 PM

## 2013-07-23 NOTE — ED Notes (Signed)
IV team paged to access port 

## 2013-07-24 DIAGNOSIS — J189 Pneumonia, unspecified organism: Secondary | ICD-10-CM

## 2013-07-24 DIAGNOSIS — A419 Sepsis, unspecified organism: Secondary | ICD-10-CM

## 2013-07-24 DIAGNOSIS — E119 Type 2 diabetes mellitus without complications: Secondary | ICD-10-CM

## 2013-07-24 DIAGNOSIS — D649 Anemia, unspecified: Secondary | ICD-10-CM

## 2013-07-24 DIAGNOSIS — N179 Acute kidney failure, unspecified: Secondary | ICD-10-CM

## 2013-07-24 DIAGNOSIS — C9002 Multiple myeloma in relapse: Secondary | ICD-10-CM

## 2013-07-24 LAB — GLUCOSE, CAPILLARY
Glucose-Capillary: 106 mg/dL — ABNORMAL HIGH (ref 70–99)
Glucose-Capillary: 99 mg/dL (ref 70–99)

## 2013-07-24 LAB — TYPE AND SCREEN
ABO/RH(D): O POS
Antibody Screen: POSITIVE
DAT, IgG: NEGATIVE
Unit division: 0

## 2013-07-24 LAB — BASIC METABOLIC PANEL
BUN: 20 mg/dL (ref 6–23)
CO2: 22 mEq/L (ref 19–32)
Chloride: 102 mEq/L (ref 96–112)
Creatinine, Ser: 1.9 mg/dL — ABNORMAL HIGH (ref 0.50–1.35)
Potassium: 3.4 mEq/L — ABNORMAL LOW (ref 3.5–5.1)

## 2013-07-24 LAB — CBC
HCT: 22.2 % — ABNORMAL LOW (ref 39.0–52.0)
Hemoglobin: 7.4 g/dL — ABNORMAL LOW (ref 13.0–17.0)
MCV: 86.7 fL (ref 78.0–100.0)
Platelets: 153 10*3/uL (ref 150–400)
RBC: 2.56 MIL/uL — ABNORMAL LOW (ref 4.22–5.81)
WBC: 2.4 10*3/uL — ABNORMAL LOW (ref 4.0–10.5)

## 2013-07-24 LAB — TSH: TSH: 0.278 u[IU]/mL — ABNORMAL LOW (ref 0.350–4.500)

## 2013-07-24 LAB — MRSA PCR SCREENING: MRSA by PCR: NEGATIVE

## 2013-07-24 LAB — HEMOGLOBIN A1C: Mean Plasma Glucose: 154 mg/dL — ABNORMAL HIGH (ref <117)

## 2013-07-24 MED ORDER — CLOTRIMAZOLE 10 MG MT TROC
10.0000 mg | Freq: Three times a day (TID) | OROMUCOSAL | Status: DC | PRN
  Filled 2013-07-24 (×2): qty 1

## 2013-07-24 MED ORDER — LEVOFLOXACIN IN D5W 750 MG/150ML IV SOLN
750.0000 mg | INTRAVENOUS | Status: DC
  Administered 2013-07-24 – 2013-07-25 (×2): 750 mg via INTRAVENOUS
  Filled 2013-07-24 (×2): qty 150

## 2013-07-24 MED ORDER — PIPERACILLIN-TAZOBACTAM 3.375 G IVPB
3.3750 g | Freq: Once | INTRAVENOUS | Status: AC
  Administered 2013-07-24: 3.375 g via INTRAVENOUS
  Filled 2013-07-24: qty 50

## 2013-07-24 MED ORDER — PIPERACILLIN-TAZOBACTAM 3.375 G IVPB
3.3750 g | Freq: Three times a day (TID) | INTRAVENOUS | Status: DC
  Administered 2013-07-24 – 2013-07-25 (×4): 3.375 g via INTRAVENOUS
  Filled 2013-07-24 (×6): qty 50

## 2013-07-24 MED ORDER — SODIUM CHLORIDE 0.9 % IV SOLN
1250.0000 mg | INTRAVENOUS | Status: DC
  Administered 2013-07-25: 1250 mg via INTRAVENOUS
  Filled 2013-07-24: qty 1250

## 2013-07-24 MED ORDER — ACETAMINOPHEN 325 MG PO TABS
650.0000 mg | ORAL_TABLET | Freq: Four times a day (QID) | ORAL | Status: DC | PRN
  Administered 2013-07-24 (×3): 650 mg via ORAL
  Filled 2013-07-24 (×3): qty 2

## 2013-07-24 MED ORDER — VANCOMYCIN HCL IN DEXTROSE 1-5 GM/200ML-% IV SOLN
1000.0000 mg | Freq: Once | INTRAVENOUS | Status: AC
  Administered 2013-07-24: 1000 mg via INTRAVENOUS
  Filled 2013-07-24: qty 200

## 2013-07-24 NOTE — Progress Notes (Signed)
ANTIBIOTIC CONSULT NOTE - INITIAL  Pharmacy Consult for Zosyn, Vancomycin  Indication: Pneumonia   Allergies  Allergen Reactions  . Raspberry     hives    Patient Measurements: Height: 5\' 11"  (180.3 cm) Weight: 213 lb 3 oz (96.7 kg) IBW/kg (Calculated) : 75.3 Adjusted Body Weight: n/a  Vital Signs: Temp: 99.5 F (37.5 C) (12/08 1200) Temp src: Oral (12/08 1200) BP: 125/64 mmHg (12/08 1200) Pulse Rate: 115 (12/08 0315) Intake/Output from previous day:   Intake/Output from this shift: Total I/O In: 360 [P.O.:360] Out: -   Labs:  Recent Labs  07/23/13 1711 07/23/13 1910 07/23/13 1916 07/23/13 1935 07/24/13 0430  WBC 2.8* 3.8*  --   --  2.4*  HGB 4.8* 9.0* 8.5*  --  7.4*  PLT  --  188  --   --  153  CREATININE  --   --  2.50* 2.27* 1.90*   Estimated Creatinine Clearance: 38 ml/min (by C-G formula based on Cr of 1.9). No results found for this basename: VANCOTROUGH, Leodis Binet, VANCORANDOM, GENTTROUGH, GENTPEAK, GENTRANDOM, TOBRATROUGH, TOBRAPEAK, TOBRARND, AMIKACINPEAK, AMIKACINTROU, AMIKACIN,  in the last 72 hours   Microbiology: Recent Results (from the past 720 hour(s))  MRSA PCR SCREENING     Status: None   Collection Time    07/24/13  7:28 AM      Result Value Range Status   MRSA by PCR NEGATIVE  NEGATIVE Final   Comment:            The GeneXpert MRSA Assay (FDA     approved for NASAL specimens     only), is one component of a     comprehensive MRSA colonization     surveillance program. It is not     intended to diagnose MRSA     infection nor to guide or     monitor treatment for     MRSA infections.    Medical History: Past Medical History  Diagnosis Date  . Multiple myeloma in relapse 07/16/2011  . Anemia of renal disease 07/16/2011  . Diabetes mellitus   . Hypertension   . Arthritis   . Anxiety   . Thyroid disease     Medications:  Prescriptions prior to admission  Medication Sig Dispense Refill  . albuterol (PROVENTIL HFA) 108 (90  BASE) MCG/ACT inhaler Inhale 2 puffs into the lungs every 4 (four) hours as needed.  2 Inhaler  3  . amLODipine (NORVASC) 10 MG tablet Take 10 mg by mouth daily before breakfast.       . B Complex-C (B-COMPLEX WITH VITAMIN C) tablet Take 1 tablet by mouth daily.      . clotrimazole (MYCELEX) 10 MG troche Take 1 tablet (10 mg total) by mouth 3 (three) times daily as needed. Mouth sore  90 tablet  0  . insulin glargine (LANTUS) 100 UNIT/ML injection Inject 5 Units into the skin daily as needed. Just take it when sugar is up per patient      . LORazepam (ATIVAN) 2 MG tablet Take 1 tablet (2 mg total) by mouth every 8 (eight) hours as needed for anxiety.  90 tablet  1  . omeprazole (PRILOSEC) 20 MG capsule Take 20 mg by mouth 2 (two) times daily.       . ondansetron (ZOFRAN) 8 MG tablet Take 1 tablet (8 mg total) by mouth 2 (two) times daily. Take 1 tablet by mouth (8 mg ) in the am and one tablet by mouth in the pm  beginning day 3 or Wednesday, 05/18/12 for 3 days total.  20 tablet  3  . oxyCODONE (ROXICODONE) 5 MG immediate release tablet Take 1 tablet (5 mg total) by mouth every 4 (four) hours as needed for pain.  40 tablet  0  . promethazine (PHENERGAN) 25 MG tablet Take 0.5 tablets (12.5 mg total) by mouth every 6 (six) hours as needed for nausea.  30 tablet  0  . ranitidine (ZANTAC) 150 MG capsule Take 150 mg by mouth at bedtime.      . sitaGLIPtin (JANUVIA) 100 MG tablet Take 1 tablet (100 mg total) by mouth daily before breakfast.  30 tablet  0  . traMADol (ULTRAM) 50 MG tablet TAKE 1 TO 2 TABLETS EVERY 6 TO 8 HOURS AS NEEDED FOR PAIN  120 tablet  0  . insulin glargine (LANTUS) 100 UNIT/ML injection Inject 5 Units into the skin at bedtime. Pt only take if bs > 125      . levothyroxine (SYNTHROID, LEVOTHROID) 50 MCG tablet Take 50 mcg by mouth daily before breakfast.       Assessment: 57 YOM with multiple myeloma getting active treatment (radiation and chemotherapy) at [facility] presents with increasing  SOB and cough X 3 days, fever and yellow sputum. Pharmacy consulted to manage Zosyn and vancomycin for suspected HCAP as possible source of sepsis. WBC 2.4, Max temp 101.8 F, CrCl ~ 38, SCr has trended down to 1.9  Goal of Therapy:  Eradication of Infection  Vancomycin trough goal: 15-20   Plan:  1) Vancomycin 1250 mg IV Q 24 hours 2) Zosyn 3.375 gm IV Q 8 hours  3) Monitor renal fx, CBC and patient clinical status 4) Check vanc troughs as necessary  5) Recommend changing Levaquin to 750 mg IV Q 48 hours based on current CrCl  Vinnie Level, PharmD.  Clinical Pharmacist Pager [phone]

## 2013-07-24 NOTE — Progress Notes (Signed)
Utilization review completed.  

## 2013-07-24 NOTE — H&P (Signed)
FMTS Attending Admission Note: Arthur Don MD Personal pager:  [phone] FPTS Service Pager:  [phone]  I  have seen and examined this patient, reviewed their chart. I have discussed this patient with the resident. I agree with the resident's findings, assessment and care plan.  Additionally:  79 yo M with known multiple myeloma treated at [facility] s/p radiation for new neck femoral lesion and chemo presenting with several days history of increasing SOB and found to have RML PNA.  Started on Vanc/Zosyn.  With cough and fever at home.  This AM improved somewhat, still weak.  Meets criteria for sepsis   Exam: Gen: AAM lying in bed, NAD CV:  Tachycardic, regular rate/rhythm Lungs:  Some crackles and dullness to percussion over RIght middle and lower base Ext:  No edema Skin:  No redness/swelling signs of infection around port-a-cath Right side of chest.  No other lesions noted  Imp/Plan: 1.  Severe sepsis: - Agree that PNA seems most likely source. - Would classify as health-care acquired and start Levaquin as well. - Keep in step-down today.   2.  MM: - last radiation was this week.  - Agree with contacting [facility] for records.    3.  Anemia: - dropping Hgb - Recommend FOBT - FU CBC's  Tobey Grim, MD 07/24/2013 7:36 AM

## 2013-07-24 NOTE — Progress Notes (Signed)
Family Medicine Teaching Service Daily Progress Note Intern Pager: [phone]  Patient name: Arthur Valdez Medical record number: [id number] Date of birth: 25-Aug-1932 Age: 79 y.o. Gender: male  Primary Care Provider: Tally Due, MD Consultants: none Code Status: Full  Pt Overview and Major Events to Date:   Assessment and Plan: SHANDY VI is a 79 y.o. male presenting with sepsis presumed due to HCAP, but patient is at higher risk for opportunistic infections given his immunocompromised status from active multiple myeloma under active treatment. PMH is significant for chronic kidney disease, anemia, and diabetes.   #Sepsis, Severe - Patient presented with fever to 101.3, HR 130, and BP of 92/53 with noted RML infiltrate on chest X-ray. No other nidus of infection noted on history or exam. Evidence of acute kidney injury but normal lactic acid. Pt undergoing active cancer treatment at [facility] for multiple myeloma, and denies any history of severe infections during treatment. Tmax 101.8 at 3am resolved with tylenol. No evidence of an infected port - Blood and urine culture pending - Vanc and Zosyn for broad coverage bacterial infections and HCAP, adding levaquin as well - If pt become hypoxemic or has tachy after rehydration, then obtain CTA to r/o PE- will hold at this time - low threshold for CCM consult if condition deteriorates   # Acute on Chronic Kidney Disease - Admission creat 2.3 with GFR 30, baseline creat 1.5; despite normal BUN, presumed pre-renal due to decreased BP and low PO intake. Cr downtrending this am 1.9 - Rehydrate and cont to trend  # Hypertension - Pt with chronic HTN on amlodipine, but taking BP at home he notes that it was "low" in the 90's systolic last week  - Hold amlodipine   # Multiple Myeloma - Free lambnda light chain, first diagnosed in 2007 and s/p numerous rounds of chemotherapy as well as autologous stem cell transplant in 2008; under current care at  [facility] by Heme-Onc and Rad-Onc departments with new lytic lesion of right femoral neck. WBC 2.4 - will f/up with onc prn- attempt to contact today for records - Cont oxycodone PRN for pain   # Anemia, Normocytic - Hgb inaccurately measures as 4.8 on initial CBC, but true reading likely close to 9.0, which is baseline; presumed due to chronic kidney disease from other notes; 7.4 this am  - Cont to trend and if needed transfuse irradiated and leukocyte reduced -consider FOBT   # Diabetes, Type 2 - Last A1C 6.3 in Feb 2014  - Hold home januvia and Lantus  - Start sensitive sliding scale  - Recheck A1C   # Anxiety  - Cont home benzo q 8 PRN, watch for low BP  FEN/GI: HHD PPx: HSQ  Disposition: pending clinical improvement, narrowing of abx coverage  Subjective: Feels much improved this morning, no c/o denies CP, palps SOB  Objective: Temp:  [98.1 F (36.7 C)-101.8 F (38.8 C)] 99.2 F (37.3 C) (12/08 0700) Pulse Rate:  [98-130] 115 (12/08 0315) Resp:  [22-35] 35 (12/08 0315) BP: (92-118)/(53-66) 118/58 mmHg (12/08 0315) SpO2:  [92 %-100 %] 94 % (12/08 0315) Weight:  [213 lb 3 oz (96.7 kg)-215 lb 6.4 oz (97.705 kg)] 213 lb 3 oz (96.7 kg) (12/08 0315) Physical Exam: General: NAD, pleasant and conversant  HEENT: NCAT, PERRL Cardiovascular: RRR, no murmurs, 2+ radial pulses but difficulty to palpate DP's; < 3 sec cap refill  Respiratory: normal work of breathing, no wheezes, rhonchi or rales, diminished breath sounds in  the bases Abdomen: soft, NDNT, NABS  Extremities: non tender, no edema  Skin: warm, dry, intact without rashes or lesions  Neuro: AXOx 4, no focal deficits    Laboratory:  Recent Labs Lab 07/23/13 1711 07/23/13 1910 07/23/13 1916 07/24/13 0430  WBC 2.8* 3.8*  --  2.4*  HGB 4.8* 9.0* 8.5* 7.4*  HCT 17.3* 27.9* 25.0* 22.2*  PLT  --  188  --  153    Recent Labs Lab 07/23/13 1916 07/23/13 1935 07/24/13 0430  NA 137 134* 134*  K 3.5 3.3* 3.4*  CL 102  99 102  CO2  --  21 22  BUN 19 20 20   CREATININE 2.50* 2.27* 1.90*  CALCIUM  --  8.6 7.8*  PROT  --  6.0  --   BILITOT  --  0.8  --   ALKPHOS  --  57  --   ALT  --  5  --   AST  --  13  --   GLUCOSE 161* 152* 143*    Lactic acid 1.47  Imaging/Diagnostic Tests: IMPRESSION: Streaky right middle lobe atelectasis or infiltrate   Anselm Lis, MD 07/24/2013, 7:08 AM PGY-1, [facility] Family Medicine FPTS Intern pager: [phone], text pages welcome

## 2013-07-24 NOTE — Progress Notes (Signed)
I discussed with  Dr Marsh.  I agree with their plans documented in their progress note for today.     

## 2013-07-25 ENCOUNTER — Encounter (HOSPITAL_COMMUNITY): Payer: Self-pay | Admitting: *Deleted

## 2013-07-25 LAB — CBC WITH DIFFERENTIAL/PLATELET
Basophils Relative: 0 % (ref 0–1)
Eosinophils Relative: 1 % (ref 0–5)
HCT: 22 % — ABNORMAL LOW (ref 39.0–52.0)
Hemoglobin: 7.3 g/dL — ABNORMAL LOW (ref 13.0–17.0)
Lymphocytes Relative: 11 % — ABNORMAL LOW (ref 12–46)
Lymphs Abs: 0.4 10*3/uL — ABNORMAL LOW (ref 0.7–4.0)
MCHC: 33.2 g/dL (ref 30.0–36.0)
MCV: 88.7 fL (ref 78.0–100.0)
Monocytes Relative: 8 % (ref 3–12)
Neutro Abs: 2.7 10*3/uL (ref 1.7–7.7)
Neutrophils Relative %: 80 % — ABNORMAL HIGH (ref 43–77)
RBC: 2.48 MIL/uL — ABNORMAL LOW (ref 4.22–5.81)
WBC Morphology: INCREASED

## 2013-07-25 LAB — BASIC METABOLIC PANEL
BUN: 18 mg/dL (ref 6–23)
Chloride: 104 mEq/L (ref 96–112)
GFR calc Af Amer: 43 mL/min — ABNORMAL LOW (ref >90)
GFR calc non Af Amer: 37 mL/min — ABNORMAL LOW (ref >90)
Glucose, Bld: 98 mg/dL (ref 70–99)
Potassium: 3.3 mEq/L — ABNORMAL LOW (ref 3.5–5.1)
Sodium: 139 mEq/L (ref 135–145)

## 2013-07-25 LAB — URINE CULTURE

## 2013-07-25 LAB — LEGIONELLA ANTIGEN, URINE: Legionella Antigen, Urine: NEGATIVE

## 2013-07-25 LAB — GLUCOSE, CAPILLARY
Glucose-Capillary: 95 mg/dL (ref 70–99)
Glucose-Capillary: 94 mg/dL (ref 70–99)

## 2013-07-25 MED ORDER — POTASSIUM CHLORIDE CRYS ER 20 MEQ PO TBCR
20.0000 meq | EXTENDED_RELEASE_TABLET | Freq: Once | ORAL | Status: AC
  Administered 2013-07-25: 20 meq via ORAL
  Filled 2013-07-25: qty 1

## 2013-07-25 MED ORDER — LEVOFLOXACIN IN D5W 750 MG/150ML IV SOLN: 750.0000 mg | INTRAVENOUS | Status: DC

## 2013-07-25 MED ORDER — VANCOMYCIN HCL IN DEXTROSE 750-5 MG/150ML-% IV SOLN
750.0000 mg | Freq: Two times a day (BID) | INTRAVENOUS | Status: DC
  Administered 2013-07-25: 750 mg via INTRAVENOUS
  Filled 2013-07-25 (×2): qty 150

## 2013-07-25 MED ORDER — GLUCERNA SHAKE PO LIQD
237.0000 mL | Freq: Two times a day (BID) | ORAL | Status: DC
  Administered 2013-07-25 – 2013-07-27 (×3): 237 mL via ORAL

## 2013-07-25 MED ORDER — GLUCERNA PO LIQD: 237.0000 mL | Freq: Two times a day (BID) | ORAL | Status: DC

## 2013-07-25 NOTE — Progress Notes (Signed)
Family Medicine Teaching Service Daily Progress Note Intern Pager: [phone]  Patient name: Arthur Valdez Medical record number: [id number] Date of birth: 1933-01-18 Age: 79 y.o. Gender: male  Primary Care Provider: Tally Due, MD Consultants: none Code Status: Full  Pt Overview and Major Events to Date:   Assessment and Plan: KIRBY CORTESE is a 79 y.o. male presenting with sepsis presumed due to HCAP, but patient is at higher risk for opportunistic infections given his immunocompromised status from active multiple myeloma under active treatment. PMH is significant for chronic kidney disease, anemia, and diabetes.   #Sepsis, Severe - Patient presented with fever to 101.3, HR 130, and BP of 92/53 with noted RML infiltrate on chest X-ray. No other nidus of infection noted on history or exam. Evidence of acute kidney injury but normal lactic acid. Pt undergoing active cancer treatment at [facility] for multiple myeloma, and denies any history of severe infections during treatment. Tmax 101.1 at 2110 resolved with tylenol. - Blood pending and urine culture no growth - Vanc and Zosyn and levaquin for broad coverage bacterial infections and HCAP - low threshold for CCM consult if condition deteriorates   # Acute on Chronic Kidney Disease - Admission creat 2.3 with GFR 30, baseline creat 1.5; despite normal BUN, presumed pre-renal due to decreased BP and low PO intake. Cr downtrending this am 1.68 - Rehydrate and cont to trend  # Hypertension - Pt with chronic HTN on amlodipine, but taking BP at home he notes that it was "low" in the 90's systolic last week  - Hold amlodipine   # Multiple Myeloma - Free lambnda light chain, first diagnosed in 2007 and s/p numerous rounds of chemotherapy as well as autologous stem cell transplant in 2008; under current care at [facility] by Heme-Onc and Rad-Onc departments with new lytic lesion of right femoral neck. WBC 2.4 - will attempt to touch base with them again  today - Cont oxycodone PRN for pain (has not required any)  # Anemia, Normocytic - Hgb inaccurately measures as 4.8 on initial CBC, but true reading likely close to 9.0, which is baseline; presumed due to chronic kidney disease from other notes; 7.4-> 7.3 this am, ANC 2.7 (may actually be higher with increased bands) - Cont to trend and if needed transfuse irradiated and leukocyte reduced -consider FOBT   # Diabetes, Type 2 - Last A1C 6.3 in Feb 2014 , a1c 7.0 current - Hold home [place] and Lantus  - Start sensitive sliding scale   # Anxiety  - Cont home benzo q 8 PRN, watch for low BP -has not required any in house  FEN/GI: HHD PPx: HSQ  Disposition: pending clinical improvement, narrowing of abx coverage  Subjective: Feeling better this morning, still with phlegm producing cough, wife is concerned that he is not eating much  Objective: Temp:  [98.4 F (36.9 C)-101.1 F (38.4 C)] 99 F (37.2 C) (12/09 0355) Pulse Rate:  [92-108] 92 (12/09 0355) Resp:  [16-27] 27 (12/09 0355) BP: (114-125)/(58-74) 122/68 mmHg (12/09 0355) SpO2:  [94 %-98 %] 96 % (12/09 0355) Weight:  [213 lb 10 oz (96.9 kg)] 213 lb 10 oz (96.9 kg) (12/09 0500) Physical Exam: General: NAD, pleasant and conversant  HEENT: NCAT, PERRL Cardiovascular: RRR, no murmurs,  Respiratory: normal work of breathing, no wheezes, rhonchi or rales, diminished breath sounds in the bases Abdomen: soft, NDNT, NABS  Extremities: non tender, no edema  Skin: warm, dry, intact without rashes or lesions  Neuro:  AXOx 4, no focal deficits    Laboratory:  Recent Labs Lab 07/23/13 1910 07/23/13 1916 07/24/13 0430 07/25/13 0455  WBC 3.8*  --  2.4* 3.4*  HGB 9.0* 8.5* 7.4* 7.3*  HCT 27.9* 25.0* 22.2* 22.0*  PLT 188  --  153 142*    Recent Labs Lab 07/23/13 1916 07/23/13 1935 07/24/13 0430  NA 137 134* 134*  K 3.5 3.3* 3.4*  CL 102 99 102  CO2  --  21 22  BUN 19 20 20   CREATININE 2.50* 2.27* 1.90*  CALCIUM  --   8.6 7.8*  PROT  --  6.0  --   BILITOT  --  0.8  --   ALKPHOS  --  57  --   ALT  --  5  --   AST  --  13  --   GLUCOSE 161* 152* 143*    Lactic acid 1.47  Imaging/Diagnostic Tests: IMPRESSION: Streaky right middle lobe atelectasis or infiltrate   Anselm Lis, MD 07/25/2013, 7:19 AM PGY-1, [place] [facility] FPTS Intern pager: [phone], text pages welcome

## 2013-07-25 NOTE — Progress Notes (Signed)
ANTIBIOTIC CONSULT NOTE - INITIAL  Pharmacy Consult for Zosyn, Vancomycin  Indication: Pneumonia   Allergies  Allergen Reactions  . Raspberry     hives    Patient Measurements: Height: 5\' 11"  (180.3 cm) Weight: 213 lb 10 oz (96.9 kg) IBW/kg (Calculated) : 75.3 Adjusted Body Weight: n/a  Vital Signs: Temp: 98.9 F (37.2 C) (12/09 0800) Temp src: Oral (12/09 0800) BP: 133/72 mmHg (12/09 0800) Pulse Rate: 92 (12/09 0355) Intake/Output from previous day: 12/08 0701 - 12/09 0700 In: 600 [P.O.:600] Out: -  Intake/Output from this shift: Total I/O In: 180 [P.O.:180] Out: -   Labs:  Recent Labs  07/23/13 1910  07/23/13 1916 07/23/13 1935 07/24/13 0430 07/25/13 0455  WBC 3.8*  --   --   --  2.4* 3.4*  HGB 9.0*  --  8.5*  --  7.4* 7.3*  PLT 188  --   --   --  153 142*  CREATININE  --   < > 2.50* 2.27* 1.90* 1.68*  < > = values in this interval not displayed. Estimated Creatinine Clearance: 43 ml/min (by C-G formula based on Cr of 1.68). No results found for this basename: VANCOTROUGH, VANCOPEAK, VANCORANDOM, GENTTROUGH, GENTPEAK, GENTRANDOM, TOBRATROUGH, TOBRAPEAK, TOBRARND, AMIKACINPEAK, AMIKACINTROU, AMIKACIN,  in the last 72 hours   Assessment: 52 YOM with multiple myeloma getting active treatment (radiation and chemotherapy) at [facility] presents with increasing SOB and cough X 3 days, fever and yellow sputum. Patient on Zosyn and vancomycin for HCAP. CXR shows RML infiltrate. WBC 3.4, Max temp 101.1 F, CrCl ~ 43, SCr has trended down to 1.68  Cultures: 12/7 Urine CX >> neg  12/7 Blood Cx x2 >> ngtd  Goal of Therapy:  Eradication of Infection  Vancomycin trough goal: 15-20   Plan:  1) Change vancomycin to 750 mg IV Q 12 hours 2) Zosyn 3.375 gm IV Q 8 hours  3) Levaquin 750 mg IV Q 48 hours per MD order 4) Monitor renal fx, CBC and patient clinical status 5) Check vanc troughs as necessary    Vinnie Level, PharmD.  Clinical Pharmacist Pager  [phone]

## 2013-07-25 NOTE — Progress Notes (Signed)
I have seen and examined this patient. I have discussed with Dr Marsh.  I agree with their findings and plans as documented in their progress note.    

## 2013-07-25 NOTE — Progress Notes (Signed)
Patient being transferred to [address] [place] per MD order. Report called to Nimrod, Charity fundraiser. Patient alert and oriented, VSS. Patient will transfer in wheel chair.

## 2013-07-26 LAB — BASIC METABOLIC PANEL
BUN: 12 mg/dL (ref 6–23)
CO2: 20 mEq/L (ref 19–32)
Calcium: 8.3 mg/dL — ABNORMAL LOW (ref 8.4–10.5)
Chloride: 108 mEq/L (ref 96–112)
Creatinine, Ser: 1.45 mg/dL — ABNORMAL HIGH (ref 0.50–1.35)
GFR calc Af Amer: 52 mL/min — ABNORMAL LOW (ref >90)
GFR calc non Af Amer: 45 mL/min — ABNORMAL LOW (ref >90)
Glucose, Bld: 114 mg/dL — ABNORMAL HIGH (ref 70–99)
Potassium: 3.3 mEq/L — ABNORMAL LOW (ref 3.5–5.1)
Sodium: 141 mEq/L (ref 135–145)

## 2013-07-26 LAB — CBC WITH DIFFERENTIAL/PLATELET
Basophils Absolute: 0 10*3/uL (ref 0.0–0.1)
Basophils Relative: 0 % (ref 0–1)
Eosinophils Absolute: 0 10*3/uL (ref 0.0–0.7)
Eosinophils Relative: 1 % (ref 0–5)
HCT: 22.4 % — ABNORMAL LOW (ref 39.0–52.0)
Hemoglobin: 7.2 g/dL — ABNORMAL LOW (ref 13.0–17.0)
Lymphocytes Relative: 13 % (ref 12–46)
Lymphs Abs: 0.5 10*3/uL — ABNORMAL LOW (ref 0.7–4.0)
MCH: 28.3 pg (ref 26.0–34.0)
MCHC: 32.1 g/dL (ref 30.0–36.0)
MCV: 88.2 fL (ref 78.0–100.0)
Monocytes Absolute: 0.4 10*3/uL (ref 0.1–1.0)
Monocytes Relative: 9 % (ref 3–12)
Neutro Abs: 3.2 10*3/uL (ref 1.7–7.7)
Neutrophils Relative %: 78 % — ABNORMAL HIGH (ref 43–77)
Platelets: 143 10*3/uL — ABNORMAL LOW (ref 150–400)
RBC: 2.54 MIL/uL — ABNORMAL LOW (ref 4.22–5.81)
RDW: 17.8 % — ABNORMAL HIGH (ref 11.5–15.5)
WBC: 4.1 10*3/uL (ref 4.0–10.5)

## 2013-07-26 LAB — GLUCOSE, CAPILLARY
Glucose-Capillary: 102 mg/dL — ABNORMAL HIGH (ref 70–99)
Glucose-Capillary: 118 mg/dL — ABNORMAL HIGH (ref 70–99)
Glucose-Capillary: 111 mg/dL — ABNORMAL HIGH (ref 70–99)
Glucose-Capillary: 125 mg/dL — ABNORMAL HIGH (ref 70–99)

## 2013-07-26 LAB — KAPPA/LAMBDA LIGHT CHAINS: Lambda free light chains: 150 mg/dL — ABNORMAL HIGH (ref 0.57–2.63)

## 2013-07-26 LAB — CLOSTRIDIUM DIFFICILE BY PCR: Toxigenic C. Difficile by PCR: NEGATIVE

## 2013-07-26 MED ORDER — POTASSIUM CHLORIDE CRYS ER 20 MEQ PO TBCR
20.0000 meq | EXTENDED_RELEASE_TABLET | Freq: Once | ORAL | Status: AC
  Administered 2013-07-26: 20 meq via ORAL
  Filled 2013-07-26: qty 1

## 2013-07-26 MED ORDER — LEVOFLOXACIN 750 MG PO TABS
750.0000 mg | ORAL_TABLET | ORAL | Status: DC
  Administered 2013-07-27: 750 mg via ORAL
  Filled 2013-07-26: qty 1

## 2013-07-26 NOTE — Progress Notes (Signed)
I discussed with  Dr Marsh.  I agree with their plans documented in their progress note for today.     

## 2013-07-26 NOTE — Progress Notes (Signed)
Pt got OOB to BR HR up to 140's non sustained. Pt asymptomatic---see monitor for saved strips. Will continue to monitor. Dierdre Highman, RN

## 2013-07-26 NOTE — Progress Notes (Signed)
Family Medicine Teaching Service Daily Progress Note Intern Pager: [phone]  Patient name: Arthur Valdez Medical record number: [id number] Date of birth: [birth year] Age: 79 y.o. Gender: male  Primary Care Provider: Tally Due, MD Consultants: none Code Status: Full  Pt Overview and Major Events to Date:   Assessment and Plan: Arthur Valdez is a 79 y.o. male presenting with sepsis presumed due to HCAP, but patient is at higher risk for opportunistic infections given his immunocompromised status from active multiple myeloma under active treatment. PMH is significant for chronic kidney disease, anemia, and diabetes.   #Sepsis, Severe - Patient presented with fever to 101.3, HR 130, and BP of 92/53 with noted RML infiltrate on chest X-ray. No other nidus of infection noted on history or exam. Evidence of acute kidney injury but normal lactic acid. Pt undergoing active cancer treatment at [facility] for multiple myeloma, and denies any history of severe infections during treatment. Strep pneumo neg, legionella neg - Blood pending but neg X48hrs and urine culture no growth - Vanc and Zosyn d/c today, cont levaquin for coverage pseudomonal infections and HCAP -monitor clinically today off iv abx -tessalon prn for cough  # Acute on Chronic Kidney Disease - Admission creat 2.3 with GFR 30, baseline creat 1.5; despite normal BUN, presumed pre-renal due to decreased BP and low PO intake. Cr downtrending - Rehydrate and cont to trend  # Hypertension - Pt with chronic HTN on amlodipine, but taking BP at home he notes that it was "low" in the 90's systolic last week, systolic 130s - Hold amlodipine   # Multiple Myeloma - Free lambnda light chain, first diagnosed in 2007 and s/p numerous rounds of chemotherapy as well as autologous stem cell transplant in 2008; under current care at [facility] by Heme-Onc and Rad-Onc departments with new lytic lesion of right femoral neck. WBC 2.4 - onc to page with  additional recs -obtaining SPEP/UPEP kappa/lambda light chains - Cont oxycodone PRN for pain (has not required any)  # Anemia, Normocytic - Hgb inaccurately measures as 4.8 on initial CBC, but true reading likely close to 9.0, which is baseline; presumed due to chronic kidney disease from other notes; 7.4-> 7.3, ANC 2.7 (may actually be higher with increased bands). Diff on yest cbc shows schistocytes but haptoglobin 322, ldh 211 - Cont to trend and if needed transfuse irradiated and leukocyte reduced   # Diabetes, Type 2 - Last A1C 6.3 in Feb 2014 , a1c 7.0 current - Hold home [place] and Lantus  - cont sensitive sliding scale   # Anxiety  - Cont home benzo q 8 PRN, watch for low BP - required one in house  #diarrhea- started yesterday evening, hold on immodium until cdiff r/o -cdiff by pcr  FEN/GI: HHD PPx: HSQ  Disposition: pending clinical improvement, toleration of abx regimen and further onc recs  Subjective: Feels much better today, developed some loose stools overnight  Objective: Temp:  [98.9 F (37.2 C)] 98.9 F (37.2 C) (12/10 0419) Pulse Rate:  [85-92] 85 (12/10 0419) Resp:  [18] 18 (12/10 0419) BP: (127-142)/(72-84) 135/84 mmHg (12/10 0419) SpO2:  [93 %] 93 % (12/10 0419) Weight:  [212 lb 15.4 oz (96.6 kg)] 212 lb 15.4 oz (96.6 kg) (12/10 0419) Physical Exam: General: NAD, pleasant and conversant  HEENT: NCAT, PERRL Cardiovascular: RRR, no murmurs,  Respiratory: normal work of breathing, no wheezes, rhonchi or rales, better aeration Abdomen: soft, NDNT, NABS  Extremities: non tender, no edema  Skin:  warm, dry, intact without rashes or lesions  Neuro: AXOx 4, no focal deficits    Laboratory:  Recent Labs Lab 07/23/13 1910 07/23/13 1916 07/24/13 0430 07/25/13 0455  WBC 3.8*  --  2.4* 3.4*  HGB 9.0* 8.5* 7.4* 7.3*  HCT 27.9* 25.0* 22.2* 22.0*  PLT 188  --  153 142*    Recent Labs Lab 07/23/13 1935 07/24/13 0430 07/25/13 0455  NA 134* 134* 139   K 3.3* 3.4* 3.3*  CL 99 102 104  CO2 21 22 18*  BUN 20 20 18   CREATININE 2.27* 1.90* 1.68*  CALCIUM 8.6 7.8* 7.8*  PROT 6.0  --   --   BILITOT 0.8  --   --   ALKPHOS 57  --   --   ALT 5  --   --   AST 13  --   --   GLUCOSE 152* 143* 98    Lactic acid 1.47  Imaging/Diagnostic Tests: IMPRESSION: Streaky right middle lobe atelectasis or infiltrate   Anselm Lis, MD 07/26/2013, 7:18 AM PGY-1, [facility] Medicine FPTS Intern pager: [phone], text pages welcome

## 2013-07-27 LAB — UIFE/LIGHT CHAINS/TP QN, 24-HR UR
Albumin, U: DETECTED
Alpha 1, Urine: DETECTED — AB
Beta, Urine: DETECTED — AB
Free Kappa Lt Chains,Ur: 6.9 mg/dL — ABNORMAL HIGH (ref 0.14–2.42)
Free Kappa/Lambda Ratio: 0.03 ratio — ABNORMAL LOW (ref 2.04–10.37)
Free Lambda Lt Chains,Ur: 212 mg/dL — ABNORMAL HIGH (ref 0.02–0.67)
Total Protein, Urine: 226.7 mg/dL

## 2013-07-27 LAB — BASIC METABOLIC PANEL
BUN: 10 mg/dL (ref 6–23)
CO2: 21 mEq/L (ref 19–32)
Chloride: 110 mEq/L (ref 96–112)
GFR calc Af Amer: 59 mL/min — ABNORMAL LOW (ref >90)
Glucose, Bld: 108 mg/dL — ABNORMAL HIGH (ref 70–99)
Potassium: 3.6 mEq/L (ref 3.5–5.1)
Sodium: 144 mEq/L (ref 135–145)

## 2013-07-27 LAB — CBC WITH DIFFERENTIAL/PLATELET
Basophils Absolute: 0 10*3/uL (ref 0.0–0.1)
Eosinophils Absolute: 0.1 10*3/uL (ref 0.0–0.7)
Hemoglobin: 8 g/dL — ABNORMAL LOW (ref 13.0–17.0)
Lymphs Abs: 1.2 10*3/uL (ref 0.7–4.0)
MCH: 28.9 pg (ref 26.0–34.0)
MCHC: 32.9 g/dL (ref 30.0–36.0)
MCV: 87.7 fL (ref 78.0–100.0)
Monocytes Absolute: 0.6 10*3/uL (ref 0.1–1.0)
Neutrophils Relative %: 71 % (ref 43–77)
Platelets: ADEQUATE 10*3/uL (ref 150–400)
RDW: 17.8 % — ABNORMAL HIGH (ref 11.5–15.5)

## 2013-07-27 LAB — GLUCOSE, CAPILLARY
Glucose-Capillary: 106 mg/dL — ABNORMAL HIGH (ref 70–99)
Glucose-Capillary: 121 mg/dL — ABNORMAL HIGH (ref 70–99)

## 2013-07-27 LAB — PROTEIN ELECTROPHORESIS, SERUM
Albumin ELP: 57.3 % (ref 55.8–66.1)
Alpha-1-Globulin: 10.3 % — ABNORMAL HIGH (ref 2.9–4.9)
Alpha-2-Globulin: 15.8 % — ABNORMAL HIGH (ref 7.1–11.8)
Beta 2: 4.7 % (ref 3.2–6.5)
Beta Globulin: 7.2 % (ref 4.7–7.2)
Gamma Globulin: 4.7 % — ABNORMAL LOW (ref 11.1–18.8)
M-Spike, %: NOT DETECTED g/dL
Total Protein ELP: 5.1 g/dL — ABNORMAL LOW (ref 6.0–8.3)

## 2013-07-27 MED ORDER — LEVOFLOXACIN 750 MG PO TABS: 750.0000 mg | ORAL_TABLET | ORAL | Status: DC

## 2013-07-27 MED ORDER — LOPERAMIDE HCL 2 MG PO CAPS: 2.0000 mg | ORAL_CAPSULE | ORAL | Status: DC | PRN

## 2013-07-27 MED ORDER — HEPARIN SOD (PORK) LOCK FLUSH 100 UNIT/ML IV SOLN
500.0000 [IU] | INTRAVENOUS | Status: AC | PRN
  Administered 2013-07-27: 500 [IU]

## 2013-07-27 MED ORDER — LOPERAMIDE HCL 2 MG PO CAPS
2.0000 mg | ORAL_CAPSULE | ORAL | Status: DC | PRN
  Administered 2013-07-27: 2 mg via ORAL
  Filled 2013-07-27: qty 1

## 2013-07-27 NOTE — Progress Notes (Signed)
I have seen and examined this patient. I have discussed with Dr Marsh.  I agree with their findings and plans as documented in their progress note.    

## 2013-07-27 NOTE — Care Management Note (Addendum)
    Page 1 of 2   07/27/2013     2:29:52 PM   CARE MANAGEMENT NOTE 07/27/2013  Patient:  Arthur Valdez, Arthur Valdez   Account Number:  [id number]  Date Initiated:  07/24/2013  Documentation initiated by:  Donn Pierini  Subjective/Objective Assessment:   Pt admitted with sepsis     Action/Plan:   PTA pt lived at home- NCM to follow for d/c needs   Anticipated DC Date:  07/27/2013   Anticipated DC Plan:        DC Planning Services  CM consult      [facility] Choice  HOME HEALTH   Choice offered to / List presented to:  C-1 Patient        HH arranged  HH-1 RN  HH-10 DISEASE MANAGEMENT      HH agency  [facility]   Status of service:  Completed, signed off Medicare Important Message given?   (If response is "NO", the following Medicare IM given date fields will be blank) Date Medicare IM given:   Date Additional Medicare IM given:    Discharge Disposition:  HOME W HOME HEALTH SERVICES  Per UR Regulation:  Reviewed for med. necessity/level of care/duration of stay  If discussed at Long Length of Stay Meetings, dates discussed:    Comments:  07-27-13 [address] Tomi Bamberger, RN,BSN [phone] CM did speak to pt and wife and they are agreeable to [facility] services. CM did make referral to [facility] to begin within 24-48 hours post d/c.    07-27-13 [address] RN,BSN [phone] [facility] copay is $6.62 - no prior Berkley Harvey is required. Pt is aware.  07-27-13 [address], RN,BSN [phone] [facility] uses [facility] on [address]. CM has a benefits check in process and will make pt aware of cost once completed.

## 2013-07-27 NOTE — Progress Notes (Signed)
Family Medicine Teaching Service Daily Progress Note Intern Pager: [phone]  Patient name: Arthur Valdez Medical record number: [id number] Date of birth: Feb 03, 1933 Age: 79 y.o. Gender: male  Primary Care Provider: Tally Due, MD Consultants: none Code Status: Full  Pt Overview and Major Events to Date:   Assessment and Plan: DEMARRI ELIE is a 79 y.o. male presenting with sepsis presumed due to HCAP, but patient is at higher risk for opportunistic infections given his immunocompromised status from active multiple myeloma under active treatment. PMH is significant for chronic kidney disease, anemia, and diabetes.   #Sepsis, Severe - Patient presented with fever to 101.3, HR 130, and BP of 92/53 with noted RML infiltrate on chest X-ray. No other nidus of infection noted on history or exam. Evidence of acute kidney injury but normal lactic acid. Pt undergoing active cancer treatment at [facility] for multiple myeloma, and denies any history of severe infections during treatment. Strep pneumo neg, legionella neg - Blood pending but neg X48hrs and urine culture no growth - afebrile on levaquin -tessalon prn for cough  # Acute on Chronic Kidney Disease - Admission creat 2.3 with GFR 30, baseline creat 1.5; despite normal BUN, presumed pre-renal due to decreased BP and low PO intake. Cr downtrending 1.45 - Rehydrate and cont to trend  # Hypertension - Pt with chronic HTN on amlodipine, but taking BP at home he notes that it was "low" in the 90's systolic last week, systolic 130s - Hold amlodipine   # Multiple Myeloma - Free lambda light chain, first diagnosed in 2007 and s/p numerous rounds of chemotherapy as well as autologous stem cell transplant in 2008; under current care at [facility] by Heme-Onc and Rad-Onc departments with new lytic lesion of right femoral neck. WBC 2.4 - onc to page with additional recs -SPEP/UPEP kappa/lambda light chains obtained per onc light chain 150 - Cont oxycodone  PRN for pain (has not required any)  # Anemia, Normocytic - Hgb inaccurately measures as 4.8 on initial CBC, but true reading likely close to 9.0, which is baseline; presumed due to chronic kidney disease from other notes; 7.4-> 7.3, ANC 2.7 (may actually be higher with increased bands). Diff on cbc shows schistocytes but haptoglobin 322, ldh 211 - Cont to trend and if needed transfuse irradiated and leukocyte reduced   # Diabetes, Type 2 - Last A1C 6.3 in Feb 2014 , a1c 7.0 current - Hold home [place] and Lantus  - cont sensitive sliding scale   # Anxiety  - Cont home benzo q 8 PRN, watch for low BP - required one in house  #diarrhea- started yesterday evening,cdiff neg -immodium prn  FEN/GI: HHD PPx: HSQ  Disposition: pending clinical improvement, toleration of diet, discussion with onc today, likely home  Subjective: Feels much better today, developed some loose stools overnight  Objective: Temp:  [97.9 F (36.6 C)-99 F (37.2 C)] 99 F (37.2 C) (12/11 0454) Pulse Rate:  [84-98] 98 (12/11 0454) Resp:  [16-18] 16 (12/11 0454) BP: (133-177)/(68-82) 137/76 mmHg (12/11 0454) SpO2:  [91 %-95 %] 91 % (12/11 0454) Weight:  [205 lb 8 oz (93.214 kg)] 205 lb 8 oz (93.214 kg) (12/11 0454) Physical Exam: General: NAD, pleasant and conversant  HEENT: NCAT, PERRL Cardiovascular: RRR, no murmurs,  Respiratory: normal work of breathing, no wheezes, rhonchi or rales, better aeration Abdomen: soft, NDNT, NABS  Extremities: non tender, no edema  Skin: warm, dry, intact without rashes or lesions  Neuro: AXOx 4, no  focal deficits    Laboratory:  Recent Labs Lab 07/24/13 0430 07/25/13 0455 07/26/13 0830  WBC 2.4* 3.4* 4.1  HGB 7.4* 7.3* 7.2*  HCT 22.2* 22.0* 22.4*  PLT 153 142* 143*    Recent Labs Lab 07/23/13 1935 07/24/13 0430 07/25/13 0455 07/26/13 0830  NA 134* 134* 139 141  K 3.3* 3.4* 3.3* 3.3*  CL 99 102 104 108  CO2 21 22 18* 20  BUN 20 20 18 12   CREATININE  2.27* 1.90* 1.68* 1.45*  CALCIUM 8.6 7.8* 7.8* 8.3*  PROT 6.0  --   --   --   BILITOT 0.8  --   --   --   ALKPHOS 57  --   --   --   ALT 5  --   --   --   AST 13  --   --   --   GLUCOSE 152* 143* 98 114*    Lactic acid 1.47  Imaging/Diagnostic Tests: IMPRESSION: Streaky right middle lobe atelectasis or infiltrate   Anselm Lis, MD 07/27/2013, 9:11 AM PGY-1, [place] [facility] FPTS Intern pager: [phone], text pages welcome

## 2013-07-27 NOTE — Discharge Summary (Signed)
Family Medicine Teaching [facility] Discharge Summary  Patient name: Arthur Valdez Medical record number: [id number] Date of birth: 1933-05-01 Age: 79 y.o. Gender: male Date of Admission: 07/23/2013  Date of Discharge: 07/27/14 Admitting Physician: Tobey Grim, MD  Primary Care Provider: Tally Due, MD Consultants: None  Indication for Hospitalization: sepsis, HCAP,   Discharge Diagnoses/Problem List:  Sepsis AKI on CKD HTN MM Normocytic anemia DMII Anxiety Diarrhea  Disposition: home with home health services  Discharge Condition: improved  Discharge Exam:  BP 137/81  Pulse 88  Temp(Src) 97.9 F (36.6 C) (Oral)  Resp 16  Ht 5\' 11"  (1.803 m)  Wt 205 lb 8 oz (93.214 kg)  BMI 28.67 kg/m2  SpO2 94% General: NAD, pleasant and conversant  HEENT: NCAT, PERRL  Cardiovascular: RRR, no murmurs,  Respiratory: normal work of breathing, no wheezes, rhonchi or rales, better aeration  Abdomen: soft, NDNT, NABS  Extremities: non tender, no edema  Skin: warm, dry, intact without rashes or lesions  Neuro: AXOx 4, no focal deficits   Brief Hospital Course:  Arthur Valdez is a 79 y.o. male who presented with sepsis presumed due to HCAP, but patient is at higher risk for opportunistic infections given his immunocompromised status from active multiple myeloma under active treatment. PMH is significant for chronic kidney disease, anemia, and diabetes.   #Sepsis, Severe - Patient presented with fever to 101.3, HR 130, and BP of 92/53 with noted RML infiltrate on chest X-ray. No other nidus of infection noted on history or exam (pt with port but did not appear infected) Evidence of acute kidney injury but normal lactic acid. Pt undergoing active cancer treatment at [facility] for multiple myeloma, but denied any history of severe infections during treatment. BCX, UCX obtained in addition to strep pneumo and legionella antigen. Given health-care exposures during treatments PNA  classified as HCAP and pt was started on vanc/zosyn as well as levaquin for coverage of pseudomonal infections. Pt was placed on tessalon for cough. Pt greatly improved after 24 hrs on IV abx. Urine Cx neg, Blood Cx neg, Strep pneumo neg and legionella neg. Vanc/zosyn d/c, and pt observed for additional day on iv levaquin. Given clinical improvement and tolerance of levaquin pt transitioned to PO dosing q48hrs for remaining 10 days.Care management consulted for home nursing to assess pt vitals for the next 2 weeks while completing abx regimen. Pt d/c'd to f/up closely with PCP in 1 week.    # Acute on Chronic Kidney Disease - Admission creat 2.3 with GFR 30, baseline creat 1.5; despite normal BUN, presumed pre-renal due to decreased BP and low PO intake. Pt was initially given fluid bolus and started on NS@125 . Once VS improved and pt able to tolerate PO, fluids d/c'd. Cr was trended, and was back to baseline 1.3 on d/c.   # Hypertension - Pt with chronic HTN on amlodipine, but taking BP at home he notes that it was "low" in the 90's systolic last week. Held amlodipine in house. Stopped on d/c. Would rec discussion with PCP.   # Multiple Myeloma - Pt with findings of elevated free lambda light chain, first diagnosed in 2007 and s/p numerous rounds of chemotherapy as well as autologous stem cell transplant in 2008; under current care at [facility] by Heme-Onc and Rad-Onc departments with new lytic lesion of right femoral neck. WBC 2.4 on admission. S/p radiation week prior to admission. Discussed admission with [facility] onc while in house, recommended obtaining SPEP/UPEP, kappa/lambda light  chains to assess for disease progression. Was continued on home oxycodone prn but did not require any during admission. Onc rec'd pt reschedule f/up (as he missed during admission) but nothing else to do acutely.  Labs pending.  # Anemia, Normocytic - Hgb inaccurately measured as 4.8 on initial CBC, but true reading likely close to  9.0, which is baseline; Presumed due to chronic kidney disease from other notes; Diff on cbc showed schistocytes but haptoglobin 322, ldh 211. Spoke with Onc regarding hgb and they felt it was most likely 2/2 disease progression. Pt did not require transfusion, hemodynamically stable, asymptomatic. Hgb on d/c 8.0  # Diabetes, Type 2 - Last A1C 6.3 in Feb 2014 , a1c 7.0 current. Held [place] and lantus while in house and started on SSI (sensitive)   # Anxiety- Stable did not attest to increased anxiety during admission. Cont'd on home benzo q8 prn.   #diarrhea- Developed after high dose abx therapy. Cdiff obtained, and was neg. Pt given immodium prn.   Issues for Follow Up:  1. Completion of abx and tolerance 2. Need for anti-HTNsives, stopped on d/c 3. F/up with onc for disease progression (will need to resch) 4. Need for home health nursing  Significant Procedures: None  Significant Labs and Imaging:   Recent Labs Lab 07/25/13 0455 07/26/13 0830 07/27/13 0800  WBC 3.4* 4.1 6.4  HGB 7.3* 7.2* 8.0*  HCT 22.0* 22.4* 24.3*  PLT 142* 143* PLATELET CLUMPS NOTED ON SMEAR, COUNT APPEARS ADEQUATE    Recent Labs Lab 07/23/13 1935 07/24/13 0430 07/25/13 0455 07/26/13 0830 07/27/13 0800  NA 134* 134* 139 141 144  K 3.3* 3.4* 3.3* 3.3* 3.6  CL 99 102 104 108 110  CO2 21 22 18* 20 21  GLUCOSE 152* 143* 98 114* 108*  BUN 20 20 18 12 10   CREATININE 2.27* 1.90* 1.68* 1.45* 1.30  CALCIUM 8.6 7.8* 7.8* 8.3* 8.9  ALKPHOS 57  --   --   --   --   AST 13  --   --   --   --   ALT 5  --   --   --   --   ALBUMIN 3.3*  --   --   --   --    Lactic acid 1.47 IMPRESSION: Streaky right middle lobe atelectasis or infiltrate   Results/Tests Pending at Time of Discharge: SPEP/UPEP final results, kappa/lambda light chains  Discharge Medications:    Medication List    STOP taking these medications       amLODipine 10 MG tablet  Commonly known as:  NORVASC      TAKE these medications        albuterol 108 (90 BASE) MCG/ACT inhaler  Commonly known as:  PROVENTIL HFA  Inhale 2 puffs into the lungs every 4 (four) hours as needed.     B-complex with vitamin C tablet  Take 1 tablet by mouth daily.     clotrimazole 10 MG troche  Commonly known as:  MYCELEX  Take 1 tablet (10 mg total) by mouth 3 (three) times daily as needed. Mouth sore     insulin glargine 100 UNIT/ML injection  Commonly known as:  LANTUS  Inject 5 Units into the skin at bedtime. Pt only take if bs > 125     insulin glargine 100 UNIT/ML injection  Commonly known as:  LANTUS  Inject 5 Units into the skin daily as needed. Just take it when sugar is up per patient  levofloxacin 750 MG tablet  Commonly known as:  LEVAQUIN  Take 1 tablet (750 mg total) by mouth every other day.     levothyroxine 50 MCG tablet  Commonly known as:  SYNTHROID, LEVOTHROID  Take 50 mcg by mouth daily before breakfast.     loperamide 2 MG capsule  Commonly known as:  IMODIUM  Take 1 capsule (2 mg total) by mouth as needed for diarrhea or loose stools.     LORazepam 2 MG tablet  Commonly known as:  ATIVAN  Take 1 tablet (2 mg total) by mouth every 8 (eight) hours as needed for anxiety.     omeprazole 20 MG capsule  Commonly known as:  PRILOSEC  Take 20 mg by mouth 2 (two) times daily.     ondansetron 8 MG tablet  Commonly known as:  ZOFRAN  Take 1 tablet (8 mg total) by mouth 2 (two) times daily. Take 1 tablet by mouth (8 mg ) in the am and one tablet by mouth in the pm beginning day 3 or Wednesday, 05/18/12 for 3 days total.     oxyCODONE 5 MG immediate release tablet  Commonly known as:  ROXICODONE  Take 1 tablet (5 mg total) by mouth every 4 (four) hours as needed for pain.     promethazine 25 MG tablet  Commonly known as:  PHENERGAN  Take 0.5 tablets (12.5 mg total) by mouth every 6 (six) hours as needed for nausea.     ranitidine 150 MG capsule  Commonly known as:  ZANTAC  Take 150 mg by mouth at bedtime.      sitaGLIPtin 100 MG tablet  Commonly known as:  JANUVIA  Take 1 tablet (100 mg total) by mouth daily before breakfast.     traMADol 50 MG tablet  Commonly known as:  ULTRAM  TAKE 1 TO 2 TABLETS EVERY 6 TO 8 HOURS AS NEEDED FOR PAIN        Discharge Instructions: Please refer to Patient Instructions section of EMR for full details.  Patient was counseled important signs and symptoms that should prompt return to medical care, changes in medications, dietary instructions, activity restrictions, and follow up appointments.   Follow-Up Appointments: Follow-up Information   Schedule an appointment as soon as possible for a visit with GUEST, Loretha Stapler, MD. (within 1-2 weeks)    Specialty:  Internal Medicine   Contact information:   [address] [place] [address] [phone]       Anselm Lis, MD 07/30/2013, 12:21 AM PGY-1, [facility] Family Medicine

## 2013-07-30 LAB — CULTURE, BLOOD (ROUTINE X 2): Culture: NO GROWTH

## 2013-07-30 NOTE — Discharge Summary (Signed)
I have seen and examined this patient. I have discussed with Dr Marsh.  I agree with their findings and plans as documented in their discharge note.    

## 2013-08-08 ENCOUNTER — Telehealth: Payer: Self-pay

## 2013-08-08 NOTE — Telephone Encounter (Signed)
Toni Amend, [facility] RN from [employer] [employer] reported that pt reports he has been getting dizzy when he changes positions since last week. RN reports that pt has been compliant w/drinking 6-8 glasses of water daily. Pt's BP today is 140/80 and pulse is fast at 84-100. I had her do orthostatics while I was on the phone and lying BP was 140/80, sitting 140/88 and standing dropped to 100/68. Advised RN to have pt come in for eval. Pt agreed.

## 2013-09-01 ENCOUNTER — Telehealth: Payer: Self-pay | Admitting: Hematology & Oncology

## 2013-09-01 ENCOUNTER — Telehealth: Payer: Self-pay

## 2013-09-01 ENCOUNTER — Other Ambulatory Visit: Payer: Self-pay | Admitting: Hematology & Oncology

## 2013-09-01 DIAGNOSIS — C9002 Multiple myeloma in relapse: Secondary | ICD-10-CM

## 2013-09-01 NOTE — Telephone Encounter (Signed)
Baxter Flattery from the cancer center calling to say that they need a referral for this patient please call [phone] with questions

## 2013-09-01 NOTE — Telephone Encounter (Signed)
Pt aware of 1-22 PET at [facility] to be NPO 6 hours and to hold his insulin

## 2013-09-02 ENCOUNTER — Other Ambulatory Visit: Payer: Self-pay | Admitting: Emergency Medicine

## 2013-09-02 DIAGNOSIS — C9002 Multiple myeloma in relapse: Secondary | ICD-10-CM

## 2013-09-02 NOTE — Telephone Encounter (Signed)
I put the referral in.

## 2013-09-02 NOTE — Telephone Encounter (Signed)
Was not sure how to put in referral for the cancer center.  Please advise referral

## 2013-09-06 ENCOUNTER — Telehealth: Payer: Self-pay | Admitting: Hematology & Oncology

## 2013-09-06 NOTE — Telephone Encounter (Signed)
Pt aware 1-22 PET moved to 1-30. Dr. Marin Olp is aware insurance has not pre-certed it yet. And said that date was fine.

## 2013-09-07 ENCOUNTER — Ambulatory Visit (HOSPITAL_COMMUNITY): Admission: RE | Admit: 2013-09-07 | Payer: Medicare HMO | Source: Ambulatory Visit

## 2013-09-13 ENCOUNTER — Telehealth: Payer: Self-pay

## 2013-09-13 NOTE — Telephone Encounter (Signed)
Spoke with pt, he just got a hip replacement on Friday and he came home on Monday. The nurse came by and states she heard some rattling in his chest and he is spitting up phlegm. She thought [facility] would send in an antibiotic to take at home but there was nothing called to their pharmacy. I advised to call [facility] to see if maybe they have forgotten. Please advise.

## 2013-09-13 NOTE — Telephone Encounter (Signed)
Can we help pt with this? Does he need to RTC?

## 2013-09-13 NOTE — Telephone Encounter (Signed)
Patient's wife states that patient had a hip replacement at [facility] and was receiving an antibiotic thru an IV. States that they told her his primary care doctor should write him an RX for an antibiotic now that he has returned home. Patient's wife unsure of the name of the antibiotic he was on while in the hospital.   [phone]

## 2013-09-15 ENCOUNTER — Encounter (HOSPITAL_COMMUNITY): Payer: Medicare HMO

## 2013-10-06 ENCOUNTER — Encounter (HOSPITAL_COMMUNITY)
Admission: RE | Admit: 2013-10-06 | Discharge: 2013-10-06 | Disposition: A | Discharge status: Home / Self Care | Payer: Medicare HMO | Source: Ambulatory Visit | Attending: Hematology & Oncology | Admitting: Hematology & Oncology

## 2013-10-06 DIAGNOSIS — C9002 Multiple myeloma in relapse: Secondary | ICD-10-CM | POA: Insufficient documentation

## 2013-10-06 LAB — GLUCOSE, CAPILLARY: Glucose-Capillary: 106 mg/dL — ABNORMAL HIGH (ref 70–99)

## 2013-10-06 MED ORDER — FLUDEOXYGLUCOSE F - 18 (FDG) INJECTION
11.2000 | Freq: Once | INTRAVENOUS | Status: AC | PRN
  Administered 2013-10-06: 11.2 via INTRAVENOUS

## 2013-10-29 ENCOUNTER — Other Ambulatory Visit: Payer: Self-pay | Admitting: Hematology & Oncology

## 2013-10-29 DIAGNOSIS — C9002 Multiple myeloma in relapse: Secondary | ICD-10-CM

## 2013-10-31 ENCOUNTER — Ambulatory Visit (HOSPITAL_COMMUNITY)
Admission: RE | Admit: 2013-10-31 | Discharge: 2013-10-31 | Disposition: A | Discharge status: Home / Self Care | Payer: Medicare HMO | Source: Ambulatory Visit | Attending: Hematology & Oncology | Admitting: Hematology & Oncology

## 2013-10-31 DIAGNOSIS — Z01818 Encounter for other preprocedural examination: Secondary | ICD-10-CM | POA: Insufficient documentation

## 2013-10-31 DIAGNOSIS — Z09 Encounter for follow-up examination after completed treatment for conditions other than malignant neoplasm: Secondary | ICD-10-CM

## 2013-10-31 DIAGNOSIS — C9002 Multiple myeloma in relapse: Secondary | ICD-10-CM

## 2013-10-31 DIAGNOSIS — I10 Essential (primary) hypertension: Secondary | ICD-10-CM | POA: Insufficient documentation

## 2013-10-31 DIAGNOSIS — E119 Type 2 diabetes mellitus without complications: Secondary | ICD-10-CM | POA: Insufficient documentation

## 2013-10-31 NOTE — Progress Notes (Signed)
Echocardiogram 2D Echocardiogram has been performed.  Arthur Valdez, Arthur Valdez 10/31/2013, 12:18 PM

## 2013-11-02 ENCOUNTER — Encounter: Payer: Self-pay | Admitting: Hematology & Oncology

## 2013-11-02 ENCOUNTER — Ambulatory Visit (HOSPITAL_BASED_OUTPATIENT_CLINIC_OR_DEPARTMENT_OTHER): Payer: Medicare HMO

## 2013-11-02 VITALS — BP 127/68 | HR 99 | Temp 97.3°F | Resp 18

## 2013-11-02 DIAGNOSIS — C9002 Multiple myeloma in relapse: Secondary | ICD-10-CM

## 2013-11-02 DIAGNOSIS — Z5111 Encounter for antineoplastic chemotherapy: Secondary | ICD-10-CM

## 2013-11-02 MED ORDER — LORAZEPAM 0.5 MG PO TABS: 0.5000 mg | ORAL_TABLET | Freq: Four times a day (QID) | ORAL | Status: DC | PRN

## 2013-11-02 MED ORDER — ONDANSETRON HCL 8 MG PO TABS
8.0000 mg | ORAL_TABLET | Freq: Two times a day (BID) | ORAL | Status: DC
Start: 2013-11-02 — End: 2014-03-05

## 2013-11-02 MED ORDER — DEXTROSE 5 % IV SOLN
20.0000 mg/m2 | Freq: Once | INTRAVENOUS | Status: AC
  Administered 2013-11-02: 44 mg via INTRAVENOUS
  Filled 2013-11-02: qty 22

## 2013-11-02 MED ORDER — DEXAMETHASONE SODIUM PHOSPHATE 10 MG/ML IJ SOLN
INTRAMUSCULAR | Status: AC
  Filled 2013-11-02: qty 1

## 2013-11-02 MED ORDER — DEXAMETHASONE 4 MG PO TABS: 8.0000 mg | ORAL_TABLET | Freq: Two times a day (BID) | ORAL | Status: DC

## 2013-11-02 MED ORDER — DEXAMETHASONE SODIUM PHOSPHATE 10 MG/ML IJ SOLN
10.0000 mg | Freq: Once | INTRAMUSCULAR | Status: AC
  Administered 2013-11-02: 10 mg via INTRAVENOUS

## 2013-11-02 MED ORDER — ACYCLOVIR 400 MG PO TABS: 400.0000 mg | ORAL_TABLET | Freq: Every day | ORAL | Status: DC

## 2013-11-02 MED ORDER — SODIUM CHLORIDE 0.9 % IV SOLN: Freq: Once | INTRAVENOUS | Status: DC

## 2013-11-02 MED ORDER — PROCHLORPERAZINE MALEATE 10 MG PO TABS: 10.0000 mg | ORAL_TABLET | Freq: Four times a day (QID) | ORAL | Status: DC | PRN

## 2013-11-02 MED ORDER — HEPARIN SOD (PORK) LOCK FLUSH 100 UNIT/ML IV SOLN
500.0000 [IU] | Freq: Once | INTRAVENOUS | Status: AC | PRN
  Administered 2013-11-02: 500 [IU]
  Filled 2013-11-02: qty 5

## 2013-11-02 MED ORDER — SODIUM CHLORIDE 0.9 % IJ SOLN
10.0000 mL | INTRAMUSCULAR | Status: DC | PRN
  Administered 2013-11-02: 10 mL
  Filled 2013-11-02: qty 10

## 2013-11-02 MED ORDER — ZOLEDRONIC ACID 4 MG/100ML IV SOLN
4.0000 mg | Freq: Once | INTRAVENOUS | Status: AC
  Administered 2013-11-02: 4 mg via INTRAVENOUS
  Filled 2013-11-02: qty 100

## 2013-11-02 MED ORDER — ONDANSETRON 8 MG/50ML IVPB (CHCC)
8.0000 mg | Freq: Once | INTRAVENOUS | Status: AC
  Administered 2013-11-02: 8 mg via INTRAVENOUS

## 2013-11-02 MED ORDER — SODIUM CHLORIDE 0.9 % IV SOLN
Freq: Once | INTRAVENOUS | Status: AC
  Administered 2013-11-02: 13:00:00 via INTRAVENOUS

## 2013-11-02 NOTE — Patient Instructions (Signed)
Carfilzomib injection What is this medicine? CARFILZOMIB (kar FILZ oh mib) is a chemotherapy drug that works by slowing or stopping cancer cell growth. This medicine is used to treat multiple myeloma. This medicine may be used for other purposes; ask your health care provider or pharmacist if you have questions. COMMON BRAND NAME(S): KYPROLIS What should I tell my health care provider before I take this medicine? They need to know if you have any of these conditions: -heart disease -irregular heartbeat -liver disease -lung or breathing disease -an unusual or allergic reaction to carfilzomib, or other medicines, foods, dyes, or preservatives -pregnant or trying to get pregnant -breast-feeding How should I use this medicine? This medicine is for injection or infusion into a vein. It is given by a health care professional in a hospital or clinic setting. Talk to your pediatrician regarding the use of this medicine in children. Special care may be needed. Overdosage: If you think you've taken too much of this medicine contact a poison control center or emergency room at once. Overdosage: If you think you have taken too much of this medicine contact a poison control center or emergency room at once. NOTE: This medicine is only for you. Do not share this medicine with others. What if I miss a dose? It is important not to miss your dose. Call your doctor or health care professional if you are unable to keep an appointment. What may interact with this medicine? Interactions are not expected. Give your health care provider a list of all the medicines, herbs, non-prescription drugs, or dietary supplements you use. Also tell them if you smoke, drink alcohol, or use illegal drugs. Some items may interact with your medicine. This list may not describe all possible interactions. Give your health care provider a list of all the medicines, herbs, non-prescription drugs, or dietary supplements you use. Also  tell them if you smoke, drink alcohol, or use illegal drugs. Some items may interact with your medicine. What should I watch for while using this medicine? Your condition will be monitored carefully while you are receiving this medicine. Report any side effects. Continue your course of treatment even though you feel ill unless your doctor tells you to stop. Call your doctor or health care professional for advice if you get a fever, chills or sore throat, or other symptoms of a cold or flu. Do not treat yourself. Try to avoid being around people who are sick. Do not become pregnant while taking this medicine. Women should inform their doctor if they wish to become pregnant or think they might be pregnant. There is a potential for serious side effects to an unborn child. Talk to your health care professional or pharmacist for more information. Do not breast-feed an infant while taking this medicine. Check with your doctor or health care professional if you get an attack of severe diarrhea, nausea and vomiting, or if you sweat a lot. The loss of too much body fluid can make it dangerous for you to take this medicine. You may get dizzy. Do not drive, use machinery, or do anything that needs mental alertness until you know how this medicine affects you. Do not stand or sit up quickly, especially if you are an older patient. This reduces the risk of dizzy or fainting spells. What side effects may I notice from receiving this medicine? Side effects that you should report to your doctor or health care professional as soon as possible: -allergic reactions like skin rash, itching or hives,  swelling of the face, lips, or tongue -breathing problems -chest pain or palpitationschest tightness -cough -dark urine -dizziness -feeling faint or lightheaded -fever or chills -general ill feeling or flu-like symptoms -light-colored stools -palpitations -right upper belly pain -swelling of the legs or ankles -unusual  bleeding or bruising -unusually weak or tired -yellowing of the eyes or skin  Side effects that usually do not require medical attention (Report these to your doctor or health care professional if they continue or are bothersome.): -diarrhea -headache -nausea, vomiting -tiredness This list may not describe all possible side effects. Call your doctor for medical advice about side effects. You may report side effects to FDA at 1-800-FDA-1088. Where should I keep my medicine? This drug is given in a hospital or clinic and will not be stored at home. NOTE: This sheet is a summary. It may not cover all possible information. If you have questions about this medicine, talk to your doctor, pharmacist, or health care provider.  2014, Elsevier/Gold Standard. (2012-01-22 17:02:29)  

## 2013-11-03 ENCOUNTER — Ambulatory Visit (HOSPITAL_COMMUNITY)
Admission: RE | Admit: 2013-11-03 | Discharge: 2013-11-03 | Disposition: A | Discharge status: Home / Self Care | Payer: Medicare HMO | Source: Ambulatory Visit | Attending: Hematology & Oncology | Admitting: Hematology & Oncology

## 2013-11-03 ENCOUNTER — Other Ambulatory Visit: Payer: Self-pay | Admitting: *Deleted

## 2013-11-03 ENCOUNTER — Ambulatory Visit (HOSPITAL_BASED_OUTPATIENT_CLINIC_OR_DEPARTMENT_OTHER): Payer: Medicare HMO

## 2013-11-03 ENCOUNTER — Ambulatory Visit (HOSPITAL_BASED_OUTPATIENT_CLINIC_OR_DEPARTMENT_OTHER): Payer: Medicare HMO | Admitting: Lab

## 2013-11-03 DIAGNOSIS — C9000 Multiple myeloma not having achieved remission: Secondary | ICD-10-CM

## 2013-11-03 DIAGNOSIS — C9002 Multiple myeloma in relapse: Secondary | ICD-10-CM

## 2013-11-03 DIAGNOSIS — Z5112 Encounter for antineoplastic immunotherapy: Secondary | ICD-10-CM

## 2013-11-03 LAB — CBC WITH DIFFERENTIAL (CANCER CENTER ONLY)
BASO#: 0 10*3/uL (ref 0.0–0.2)
BASO%: 0 % (ref 0.0–2.0)
EOS%: 0.2 % (ref 0.0–7.0)
Eosinophils Absolute: 0 10*3/uL (ref 0.0–0.5)
HEMATOCRIT: 25.1 % — AB (ref 38.7–49.9)
HGB: 8 g/dL — ABNORMAL LOW (ref 13.0–17.1)
LYMPH#: 0.8 10*3/uL — ABNORMAL LOW (ref 0.9–3.3)
LYMPH%: 19.4 % (ref 14.0–48.0)
MCH: 28 pg (ref 28.0–33.4)
MCHC: 31.9 g/dL — AB (ref 32.0–35.9)
MCV: 88 fL (ref 82–98)
MONO#: 0.6 10*3/uL (ref 0.1–0.9)
MONO%: 13.8 % — ABNORMAL HIGH (ref 0.0–13.0)
NEUT#: 2.7 10*3/uL (ref 1.5–6.5)
NEUT%: 66.6 % (ref 40.0–80.0)
Platelets: 141 10*3/uL — ABNORMAL LOW (ref 145–400)
RBC: 2.86 10*6/uL — ABNORMAL LOW (ref 4.20–5.70)
RDW: 18.9 % — AB (ref 11.1–15.7)
WBC: 4.1 10*3/uL (ref 4.0–10.0)

## 2013-11-03 LAB — CMP (CANCER CENTER ONLY)
ALT: 14 U/L (ref 10–47)
AST: 19 U/L (ref 11–38)
Albumin: 3.3 g/dL (ref 3.3–5.5)
Alkaline Phosphatase: 64 U/L (ref 26–84)
BUN, Bld: 20 mg/dL (ref 7–22)
CALCIUM: 8.7 mg/dL (ref 8.0–10.3)
CHLORIDE: 108 meq/L (ref 98–108)
CO2: 25 mEq/L (ref 18–33)
Creat: 1.6 mg/dl — ABNORMAL HIGH (ref 0.6–1.2)
Glucose, Bld: 120 mg/dL — ABNORMAL HIGH (ref 73–118)
Potassium: 3.6 mEq/L (ref 3.3–4.7)
SODIUM: 142 meq/L (ref 128–145)
TOTAL PROTEIN: 6.3 g/dL — AB (ref 6.4–8.1)
Total Bilirubin: 0.6 mg/dl (ref 0.20–1.60)

## 2013-11-03 LAB — PRO B NATRIURETIC PEPTIDE: PRO B NATRI PEPTIDE: 824 pg/mL — AB (ref <451)

## 2013-11-03 LAB — HOLD TUBE, BLOOD BANK - CHCC SATELLITE

## 2013-11-03 LAB — ABO/RH: ABO/RH(D): O POS

## 2013-11-03 MED ORDER — SODIUM CHLORIDE 0.9 % IV SOLN
Freq: Once | INTRAVENOUS | Status: AC
  Administered 2013-11-03: 14:00:00 via INTRAVENOUS

## 2013-11-03 MED ORDER — SODIUM CHLORIDE 0.9 % IJ SOLN
10.0000 mL | INTRAMUSCULAR | Status: DC | PRN
  Filled 2013-11-03: qty 10

## 2013-11-03 MED ORDER — DEXTROSE 5 % IV SOLN
27.0000 mg/m2 | Freq: Once | INTRAVENOUS | Status: AC
  Administered 2013-11-03: 58 mg via INTRAVENOUS
  Filled 2013-11-03: qty 29

## 2013-11-03 MED ORDER — DEXAMETHASONE SODIUM PHOSPHATE 10 MG/ML IJ SOLN
10.0000 mg | Freq: Once | INTRAMUSCULAR | Status: AC
  Administered 2013-11-03: 10 mg via INTRAVENOUS

## 2013-11-03 MED ORDER — DEXAMETHASONE SODIUM PHOSPHATE 10 MG/ML IJ SOLN
INTRAMUSCULAR | Status: AC
  Filled 2013-11-03: qty 1

## 2013-11-03 MED ORDER — HEPARIN SOD (PORK) LOCK FLUSH 100 UNIT/ML IV SOLN
500.0000 [IU] | Freq: Once | INTRAVENOUS | Status: DC | PRN
  Filled 2013-11-03: qty 5

## 2013-11-03 MED ORDER — ONDANSETRON 8 MG/50ML IVPB (CHCC)
8.0000 mg | Freq: Once | INTRAVENOUS | Status: AC
Start: 2013-11-03 — End: 2013-11-03
  Administered 2013-11-03: 8 mg via INTRAVENOUS

## 2013-11-03 MED ORDER — SODIUM CHLORIDE 0.9 % IV SOLN: Freq: Once | INTRAVENOUS | Status: DC

## 2013-11-03 NOTE — Progress Notes (Signed)
Erroneous encounter

## 2013-11-03 NOTE — Patient Instructions (Signed)
Carfilzomib injection What is this medicine? CARFILZOMIB (kar FILZ oh mib) is a chemotherapy drug that works by slowing or stopping cancer cell growth. This medicine is used to treat multiple myeloma. This medicine may be used for other purposes; ask your health care provider or pharmacist if you have questions. COMMON BRAND NAME(S): KYPROLIS What should I tell my health care provider before I take this medicine? They need to know if you have any of these conditions: -heart disease -irregular heartbeat -liver disease -lung or breathing disease -an unusual or allergic reaction to carfilzomib, or other medicines, foods, dyes, or preservatives -pregnant or trying to get pregnant -breast-feeding How should I use this medicine? This medicine is for injection or infusion into a vein. It is given by a health care professional in a hospital or clinic setting. Talk to your pediatrician regarding the use of this medicine in children. Special care may be needed. Overdosage: If you think you've taken too much of this medicine contact a poison control center or emergency room at once. Overdosage: If you think you have taken too much of this medicine contact a poison control center or emergency room at once. NOTE: This medicine is only for you. Do not share this medicine with others. What if I miss a dose? It is important not to miss your dose. Call your doctor or health care professional if you are unable to keep an appointment. What may interact with this medicine? Interactions are not expected. Give your health care provider a list of all the medicines, herbs, non-prescription drugs, or dietary supplements you use. Also tell them if you smoke, drink alcohol, or use illegal drugs. Some items may interact with your medicine. This list may not describe all possible interactions. Give your health care provider a list of all the medicines, herbs, non-prescription drugs, or dietary supplements you use. Also  tell them if you smoke, drink alcohol, or use illegal drugs. Some items may interact with your medicine. What should I watch for while using this medicine? Your condition will be monitored carefully while you are receiving this medicine. Report any side effects. Continue your course of treatment even though you feel ill unless your doctor tells you to stop. Call your doctor or health care professional for advice if you get a fever, chills or sore throat, or other symptoms of a cold or flu. Do not treat yourself. Try to avoid being around people who are sick. Do not become pregnant while taking this medicine. Women should inform their doctor if they wish to become pregnant or think they might be pregnant. There is a potential for serious side effects to an unborn child. Talk to your health care professional or pharmacist for more information. Do not breast-feed an infant while taking this medicine. Check with your doctor or health care professional if you get an attack of severe diarrhea, nausea and vomiting, or if you sweat a lot. The loss of too much body fluid can make it dangerous for you to take this medicine. You may get dizzy. Do not drive, use machinery, or do anything that needs mental alertness until you know how this medicine affects you. Do not stand or sit up quickly, especially if you are an older patient. This reduces the risk of dizzy or fainting spells. What side effects may I notice from receiving this medicine? Side effects that you should report to your doctor or health care professional as soon as possible: -allergic reactions like skin rash, itching or hives,  swelling of the face, lips, or tongue -breathing problems -chest pain or palpitationschest tightness -cough -dark urine -dizziness -feeling faint or lightheaded -fever or chills -general ill feeling or flu-like symptoms -light-colored stools -palpitations -right upper belly pain -swelling of the legs or ankles -unusual  bleeding or bruising -unusually weak or tired -yellowing of the eyes or skin  Side effects that usually do not require medical attention (Report these to your doctor or health care professional if they continue or are bothersome.): -diarrhea -headache -nausea, vomiting -tiredness This list may not describe all possible side effects. Call your doctor for medical advice about side effects. You may report side effects to FDA at 1-800-FDA-1088. Where should I keep my medicine? This drug is given in a hospital or clinic and will not be stored at home. NOTE: This sheet is a summary. It may not cover all possible information. If you have questions about this medicine, talk to your doctor, pharmacist, or health care provider.  2014, Elsevier/Gold Standard. (2012-01-22 17:02:29)  

## 2013-11-06 ENCOUNTER — Telehealth: Payer: Self-pay | Admitting: Hematology & Oncology

## 2013-11-06 ENCOUNTER — Encounter: Payer: Self-pay | Admitting: Hematology & Oncology

## 2013-11-06 ENCOUNTER — Ambulatory Visit (HOSPITAL_BASED_OUTPATIENT_CLINIC_OR_DEPARTMENT_OTHER): Payer: Medicare HMO

## 2013-11-06 VITALS — BP 124/82 | HR 78 | Temp 96.7°F | Resp 16

## 2013-11-06 DIAGNOSIS — C9002 Multiple myeloma in relapse: Secondary | ICD-10-CM

## 2013-11-06 LAB — PREPARE RBC (CROSSMATCH)

## 2013-11-06 MED ORDER — SODIUM CHLORIDE 0.9 % IJ SOLN
3.0000 mL | INTRAMUSCULAR | Status: DC | PRN
  Filled 2013-11-06: qty 10

## 2013-11-06 MED ORDER — ACETAMINOPHEN 325 MG PO TABS
ORAL_TABLET | ORAL | Status: AC
  Filled 2013-11-06: qty 2

## 2013-11-06 MED ORDER — SODIUM CHLORIDE 0.9 % IV SOLN
250.0000 mL | Freq: Once | INTRAVENOUS | Status: AC
  Administered 2013-11-06: 250 mL via INTRAVENOUS

## 2013-11-06 MED ORDER — HEPARIN SOD (PORK) LOCK FLUSH 100 UNIT/ML IV SOLN
250.0000 [IU] | INTRAVENOUS | Status: DC | PRN
  Filled 2013-11-06: qty 5

## 2013-11-06 MED ORDER — FUROSEMIDE 10 MG/ML IJ SOLN
INTRAMUSCULAR | Status: AC
  Filled 2013-11-06: qty 4

## 2013-11-06 MED ORDER — DIPHENHYDRAMINE HCL 25 MG PO CAPS
25.0000 mg | ORAL_CAPSULE | Freq: Once | ORAL | Status: AC
  Administered 2013-11-06: 25 mg via ORAL

## 2013-11-06 MED ORDER — FUROSEMIDE 10 MG/ML IJ SOLN
20.0000 mg | Freq: Once | INTRAMUSCULAR | Status: AC
  Administered 2013-11-06: 20 mg via INTRAVENOUS

## 2013-11-06 MED ORDER — ACETAMINOPHEN 325 MG PO TABS
650.0000 mg | ORAL_TABLET | Freq: Once | ORAL | Status: AC
  Administered 2013-11-06: 650 mg via ORAL

## 2013-11-06 MED ORDER — DIPHENHYDRAMINE HCL 25 MG PO CAPS
ORAL_CAPSULE | ORAL | Status: AC
  Filled 2013-11-06: qty 1

## 2013-11-06 NOTE — Patient Instructions (Signed)
Blood Transfusion Information WHAT IS A BLOOD TRANSFUSION? A transfusion is the replacement of blood or some of its parts. Blood is made up of multiple cells which provide different functions.  Red blood cells carry oxygen and are used for blood loss replacement.  White blood cells fight against infection.  Platelets control bleeding.  Plasma helps clot blood.  Other blood products are available for specialized needs, such as hemophilia or other clotting disorders. BEFORE THE TRANSFUSION  Who gives blood for transfusions?   You may be able to donate blood to be used at a later date on yourself (autologous donation).  Relatives can be asked to donate blood. This is generally not any safer than if you have received blood from a stranger. The same precautions are taken to ensure safety when a relative's blood is donated.  Healthy volunteers who are fully evaluated to make sure their blood is safe. This is blood bank blood. Transfusion therapy is the safest it has ever been in the practice of medicine. Before blood is taken from a donor, a complete history is taken to make sure that person has no history of diseases nor engages in risky social behavior (examples are intravenous drug use or sexual activity with multiple partners). The donor's travel history is screened to minimize risk of transmitting infections, such as malaria. The donated blood is tested for signs of infectious diseases, such as HIV and hepatitis. The blood is then tested to be sure it is compatible with you in order to minimize the chance of a transfusion reaction. If you or a relative donates blood, this is often done in anticipation of surgery and is not appropriate for emergency situations. It takes many days to process the donated blood. RISKS AND COMPLICATIONS Although transfusion therapy is very safe and saves many lives, the main dangers of transfusion include:   Getting an infectious disease.  Developing a  transfusion reaction. This is an allergic reaction to something in the blood you were given. Every precaution is taken to prevent this. The decision to have a blood transfusion has been considered carefully by your caregiver before blood is given. Blood is not given unless the benefits outweigh the risks. AFTER THE TRANSFUSION  Right after receiving a blood transfusion, you will usually feel much better and more energetic. This is especially true if your red blood cells have gotten low (anemic). The transfusion raises the level of the red blood cells which carry oxygen, and this usually causes an energy increase.  The nurse administering the transfusion will monitor you carefully for complications. HOME CARE INSTRUCTIONS  No special instructions are needed after a transfusion. You may find your energy is better. Speak with your caregiver about any limitations on activity for underlying diseases you may have. SEEK MEDICAL CARE IF:   Your condition is not improving after your transfusion.  You develop redness or irritation at the intravenous (IV) site. SEEK IMMEDIATE MEDICAL CARE IF:  Any of the following symptoms occur over the next 12 hours:  Shaking chills.  You have a temperature by mouth above 102 F (38.9 C), not controlled by medicine.  Chest, back, or muscle pain.  People around you feel you are not acting correctly or are confused.  Shortness of breath or difficulty breathing.  Dizziness and fainting.  You get a rash or develop hives.  You have a decrease in urine output.  Your urine turns a dark color or changes to pink, red, or [place]. Any of the following   symptoms occur over the next 10 days:  You have a temperature by mouth above 102 F (38.9 C), not controlled by medicine.  Shortness of breath.  Weakness after normal activity.  The white part of the eye turns yellow (jaundice).  You have a decrease in the amount of urine or are urinating less often.  Your  urine turns a dark color or changes to pink, red, or [place]. Document Released: 07/31/2000 Document Revised: 10/26/2011 Document Reviewed: 03/19/2008 ExitCare Patient Information 2014 [employer].  

## 2013-11-06 NOTE — Telephone Encounter (Signed)
[place] APPROVED Ondansetron 8mg  tab for a year.  Valid: 11/06/2013 - 11/07/2014

## 2013-11-07 LAB — TYPE AND SCREEN
ABO/RH(D): O POS
Antibody Screen: POSITIVE
DAT, IgG: NEGATIVE
UNIT DIVISION: 0
Unit division: 0

## 2013-11-08 ENCOUNTER — Ambulatory Visit: Payer: Medicare HMO

## 2013-11-08 ENCOUNTER — Other Ambulatory Visit: Payer: Self-pay | Admitting: *Deleted

## 2013-11-08 DIAGNOSIS — C9002 Multiple myeloma in relapse: Secondary | ICD-10-CM

## 2013-11-08 LAB — KAPPA/LAMBDA LIGHT CHAINS
Kappa free light chain: 0.57 mg/dL (ref 0.33–1.94)
Kappa:Lambda Ratio: 0 — ABNORMAL LOW (ref 0.26–1.65)
Lambda Free Lght Chn: 318 mg/dL — ABNORMAL HIGH (ref 0.57–2.63)

## 2013-11-08 LAB — PROTEIN ELECTROPHORESIS, SERUM, WITH REFLEX
ALPHA-1-GLOBULIN: 7.6 % — AB (ref 2.9–4.9)
Albumin ELP: 59.4 % (ref 55.8–66.1)
Alpha-2-Globulin: 14.7 % — ABNORMAL HIGH (ref 7.1–11.8)
Beta 2: 4.8 % (ref 3.2–6.5)
Beta Globulin: 8 % — ABNORMAL HIGH (ref 4.7–7.2)
Gamma Globulin: 5.5 % — ABNORMAL LOW (ref 11.1–18.8)
M-Spike, %: 0.13 g/dL
TOTAL PROTEIN, SERUM ELECTROPHOR: 5.5 g/dL — AB (ref 6.0–8.3)

## 2013-11-08 LAB — LACTATE DEHYDROGENASE: LDH: 125 U/L (ref 94–250)

## 2013-11-08 LAB — IGG, IGA, IGM
IgA: 12 mg/dL — ABNORMAL LOW (ref 68–379)
IgG (Immunoglobin G), Serum: 289 mg/dL — ABNORMAL LOW (ref 650–1600)

## 2013-11-08 LAB — IFE INTERPRETATION

## 2013-11-09 ENCOUNTER — Ambulatory Visit (HOSPITAL_BASED_OUTPATIENT_CLINIC_OR_DEPARTMENT_OTHER): Payer: Medicare HMO

## 2013-11-09 ENCOUNTER — Other Ambulatory Visit (HOSPITAL_BASED_OUTPATIENT_CLINIC_OR_DEPARTMENT_OTHER): Payer: Medicare HMO | Admitting: Lab

## 2013-11-09 VITALS — BP 132/82 | HR 99 | Temp 98.0°F | Resp 16

## 2013-11-09 DIAGNOSIS — Z5112 Encounter for antineoplastic immunotherapy: Secondary | ICD-10-CM

## 2013-11-09 DIAGNOSIS — C9002 Multiple myeloma in relapse: Secondary | ICD-10-CM

## 2013-11-09 LAB — CBC WITH DIFFERENTIAL (CANCER CENTER ONLY)
BASO#: 0 10*3/uL (ref 0.0–0.2)
BASO%: 0.2 % (ref 0.0–2.0)
EOS ABS: 0.1 10*3/uL (ref 0.0–0.5)
EOS%: 1.8 % (ref 0.0–7.0)
HCT: 30.5 % — ABNORMAL LOW (ref 38.7–49.9)
HGB: 9.7 g/dL — ABNORMAL LOW (ref 13.0–17.1)
LYMPH#: 0.9 10*3/uL (ref 0.9–3.3)
LYMPH%: 16.9 % (ref 14.0–48.0)
MCH: 27.5 pg — ABNORMAL LOW (ref 28.0–33.4)
MCHC: 31.8 g/dL — AB (ref 32.0–35.9)
MCV: 86 fL (ref 82–98)
MONO#: 0.7 10*3/uL (ref 0.1–0.9)
MONO%: 12.9 % (ref 0.0–13.0)
NEUT%: 68.2 % (ref 40.0–80.0)
NEUTROS ABS: 3.7 10*3/uL (ref 1.5–6.5)
PLATELETS: 144 10*3/uL — AB (ref 145–400)
RBC: 3.53 10*6/uL — ABNORMAL LOW (ref 4.20–5.70)
RDW: 18.3 % — AB (ref 11.1–15.7)
WBC: 5.4 10*3/uL (ref 4.0–10.0)

## 2013-11-09 LAB — COMPREHENSIVE METABOLIC PANEL
ALK PHOS: 58 U/L (ref 39–117)
ALT: <8 U/L (ref 0–53)
AST: 12 U/L (ref 0–37)
Albumin: 3.4 g/dL — ABNORMAL LOW (ref 3.5–5.2)
BILIRUBIN TOTAL: 0.6 mg/dL (ref 0.2–1.2)
BUN: 15 mg/dL (ref 6–23)
CO2: 21 mEq/L (ref 19–32)
Calcium: 7.5 mg/dL — ABNORMAL LOW (ref 8.4–10.5)
Chloride: 107 mEq/L (ref 96–112)
Creatinine, Ser: 1.2 mg/dL (ref 0.50–1.35)
GLUCOSE: 120 mg/dL — AB (ref 70–99)
POTASSIUM: 3.8 meq/L (ref 3.5–5.3)
SODIUM: 138 meq/L (ref 135–145)
TOTAL PROTEIN: 5.3 g/dL — AB (ref 6.0–8.3)

## 2013-11-09 MED ORDER — DEXAMETHASONE SODIUM PHOSPHATE 10 MG/ML IJ SOLN
INTRAMUSCULAR | Status: AC
  Filled 2013-11-09: qty 1

## 2013-11-09 MED ORDER — ONDANSETRON 8 MG/50ML IVPB (CHCC)
8.0000 mg | Freq: Once | INTRAVENOUS | Status: AC
  Administered 2013-11-09: 8 mg via INTRAVENOUS

## 2013-11-09 MED ORDER — SODIUM CHLORIDE 0.9 % IJ SOLN
10.0000 mL | INTRAMUSCULAR | Status: DC | PRN
  Administered 2013-11-09: 10 mL
  Filled 2013-11-09: qty 10

## 2013-11-09 MED ORDER — DEXAMETHASONE SODIUM PHOSPHATE 10 MG/ML IJ SOLN
10.0000 mg | Freq: Once | INTRAMUSCULAR | Status: AC
  Administered 2013-11-09: 10 mg via INTRAVENOUS

## 2013-11-09 MED ORDER — SODIUM CHLORIDE 0.9 % IV SOLN
Freq: Once | INTRAVENOUS | Status: AC
  Administered 2013-11-09: 12:00:00 via INTRAVENOUS

## 2013-11-09 MED ORDER — HEPARIN SOD (PORK) LOCK FLUSH 100 UNIT/ML IV SOLN
500.0000 [IU] | Freq: Once | INTRAVENOUS | Status: AC | PRN
  Administered 2013-11-09: 500 [IU]
  Filled 2013-11-09: qty 5

## 2013-11-09 MED ORDER — CARFILZOMIB CHEMO INJECTION 60 MG
56.0000 mg/m2 | Freq: Once | INTRAVENOUS | Status: AC
  Administered 2013-11-09: 120 mg via INTRAVENOUS
  Filled 2013-11-09: qty 60

## 2013-11-10 ENCOUNTER — Ambulatory Visit (HOSPITAL_BASED_OUTPATIENT_CLINIC_OR_DEPARTMENT_OTHER): Payer: Medicare HMO

## 2013-11-10 VITALS — BP 143/69 | HR 79 | Temp 97.0°F | Resp 18

## 2013-11-10 DIAGNOSIS — C9002 Multiple myeloma in relapse: Secondary | ICD-10-CM

## 2013-11-10 DIAGNOSIS — Z5112 Encounter for antineoplastic immunotherapy: Secondary | ICD-10-CM

## 2013-11-10 MED ORDER — ONDANSETRON 8 MG/50ML IVPB (CHCC)
8.0000 mg | Freq: Once | INTRAVENOUS | Status: AC
  Administered 2013-11-10: 8 mg via INTRAVENOUS

## 2013-11-10 MED ORDER — DEXAMETHASONE SODIUM PHOSPHATE 10 MG/ML IJ SOLN
INTRAMUSCULAR | Status: AC
  Filled 2013-11-10: qty 1

## 2013-11-10 MED ORDER — DEXTROSE 5 % IV SOLN
56.0000 mg/m2 | Freq: Once | INTRAVENOUS | Status: AC
  Administered 2013-11-10: 120 mg via INTRAVENOUS
  Filled 2013-11-10: qty 60

## 2013-11-10 MED ORDER — HEPARIN SOD (PORK) LOCK FLUSH 100 UNIT/ML IV SOLN
500.0000 [IU] | Freq: Once | INTRAVENOUS | Status: AC | PRN
  Administered 2013-11-10: 500 [IU]
  Filled 2013-11-10: qty 5

## 2013-11-10 MED ORDER — SODIUM CHLORIDE 0.9 % IJ SOLN
10.0000 mL | INTRAMUSCULAR | Status: DC | PRN
  Administered 2013-11-10: 10 mL
  Filled 2013-11-10: qty 10

## 2013-11-10 MED ORDER — DEXAMETHASONE SODIUM PHOSPHATE 10 MG/ML IJ SOLN
10.0000 mg | Freq: Once | INTRAMUSCULAR | Status: AC
  Administered 2013-11-10: 10 mg via INTRAVENOUS

## 2013-11-10 MED ORDER — SODIUM CHLORIDE 0.9 % IV SOLN
Freq: Once | INTRAVENOUS | Status: AC
  Administered 2013-11-10: 13:00:00 via INTRAVENOUS

## 2013-11-10 NOTE — Patient Instructions (Signed)
Carfilzomib injection What is this medicine? CARFILZOMIB (kar FILZ oh mib) is a chemotherapy drug that works by slowing or stopping cancer cell growth. This medicine is used to treat multiple myeloma. This medicine may be used for other purposes; ask your health care provider or pharmacist if you have questions. COMMON BRAND NAME(S): KYPROLIS What should I tell my health care provider before I take this medicine? They need to know if you have any of these conditions: -heart disease -irregular heartbeat -liver disease -lung or breathing disease -an unusual or allergic reaction to carfilzomib, or other medicines, foods, dyes, or preservatives -pregnant or trying to get pregnant -breast-feeding How should I use this medicine? This medicine is for injection or infusion into a vein. It is given by a health care professional in a hospital or clinic setting. Talk to your pediatrician regarding the use of this medicine in children. Special care may be needed. Overdosage: If you think you've taken too much of this medicine contact a poison control center or emergency room at once. Overdosage: If you think you have taken too much of this medicine contact a poison control center or emergency room at once. NOTE: This medicine is only for you. Do not share this medicine with others. What if I miss a dose? It is important not to miss your dose. Call your doctor or health care professional if you are unable to keep an appointment. What may interact with this medicine? Interactions are not expected. Give your health care provider a list of all the medicines, herbs, non-prescription drugs, or dietary supplements you use. Also tell them if you smoke, drink alcohol, or use illegal drugs. Some items may interact with your medicine. This list may not describe all possible interactions. Give your health care provider a list of all the medicines, herbs, non-prescription drugs, or dietary supplements you use. Also  tell them if you smoke, drink alcohol, or use illegal drugs. Some items may interact with your medicine. What should I watch for while using this medicine? Your condition will be monitored carefully while you are receiving this medicine. Report any side effects. Continue your course of treatment even though you feel ill unless your doctor tells you to stop. Call your doctor or health care professional for advice if you get a fever, chills or sore throat, or other symptoms of a cold or flu. Do not treat yourself. Try to avoid being around people who are sick. Do not become pregnant while taking this medicine. Women should inform their doctor if they wish to become pregnant or think they might be pregnant. There is a potential for serious side effects to an unborn child. Talk to your health care professional or pharmacist for more information. Do not breast-feed an infant while taking this medicine. Check with your doctor or health care professional if you get an attack of severe diarrhea, nausea and vomiting, or if you sweat a lot. The loss of too much body fluid can make it dangerous for you to take this medicine. You may get dizzy. Do not drive, use machinery, or do anything that needs mental alertness until you know how this medicine affects you. Do not stand or sit up quickly, especially if you are an older patient. This reduces the risk of dizzy or fainting spells. What side effects may I notice from receiving this medicine? Side effects that you should report to your doctor or health care professional as soon as possible: -allergic reactions like skin rash, itching or hives,  swelling of the face, lips, or tongue -breathing problems -chest pain or palpitationschest tightness -cough -dark urine -dizziness -feeling faint or lightheaded -fever or chills -general ill feeling or flu-like symptoms -light-colored stools -palpitations -right upper belly pain -swelling of the legs or ankles -unusual  bleeding or bruising -unusually weak or tired -yellowing of the eyes or skin  Side effects that usually do not require medical attention (Report these to your doctor or health care professional if they continue or are bothersome.): -diarrhea -headache -nausea, vomiting -tiredness This list may not describe all possible side effects. Call your doctor for medical advice about side effects. You may report side effects to FDA at 1-800-FDA-1088. Where should I keep my medicine? This drug is given in a hospital or clinic and will not be stored at home. NOTE: This sheet is a summary. It may not cover all possible information. If you have questions about this medicine, talk to your doctor, pharmacist, or health care provider.  2014, Elsevier/Gold Standard. (2012-01-22 17:02:29)  

## 2013-11-15 ENCOUNTER — Other Ambulatory Visit: Payer: Self-pay | Admitting: Nurse Practitioner

## 2013-11-15 ENCOUNTER — Other Ambulatory Visit: Payer: Medicare HMO | Admitting: Lab

## 2013-11-15 ENCOUNTER — Ambulatory Visit: Payer: Medicare HMO

## 2013-11-15 DIAGNOSIS — C9002 Multiple myeloma in relapse: Secondary | ICD-10-CM

## 2013-11-16 ENCOUNTER — Ambulatory Visit: Payer: Medicare HMO

## 2013-11-16 ENCOUNTER — Ambulatory Visit (HOSPITAL_BASED_OUTPATIENT_CLINIC_OR_DEPARTMENT_OTHER): Payer: Medicare HMO | Admitting: Lab

## 2013-11-16 ENCOUNTER — Ambulatory Visit (HOSPITAL_BASED_OUTPATIENT_CLINIC_OR_DEPARTMENT_OTHER): Payer: Medicare HMO

## 2013-11-16 VITALS — BP 123/76 | Temp 97.9°F | Resp 18

## 2013-11-16 DIAGNOSIS — Z5112 Encounter for antineoplastic immunotherapy: Secondary | ICD-10-CM

## 2013-11-16 DIAGNOSIS — C9002 Multiple myeloma in relapse: Secondary | ICD-10-CM

## 2013-11-16 LAB — CMP (CANCER CENTER ONLY)
ALBUMIN: 3.2 g/dL — AB (ref 3.3–5.5)
ALT: 13 U/L (ref 10–47)
AST: 13 U/L (ref 11–38)
Alkaline Phosphatase: 54 U/L (ref 26–84)
BUN, Bld: 22 mg/dL (ref 7–22)
CALCIUM: 7.7 mg/dL — AB (ref 8.0–10.3)
CHLORIDE: 109 meq/L — AB (ref 98–108)
CO2: 25 mEq/L (ref 18–33)
Creat: 1.3 mg/dl — ABNORMAL HIGH (ref 0.6–1.2)
Glucose, Bld: 136 mg/dL — ABNORMAL HIGH (ref 73–118)
Potassium: 3.6 mEq/L (ref 3.3–4.7)
Sodium: 139 mEq/L (ref 128–145)
TOTAL PROTEIN: 6.1 g/dL — AB (ref 6.4–8.1)
Total Bilirubin: 0.7 mg/dl (ref 0.20–1.60)

## 2013-11-16 LAB — CBC WITH DIFFERENTIAL (CANCER CENTER ONLY)
BASO#: 0 10*3/uL (ref 0.0–0.2)
BASO%: 0.2 % (ref 0.0–2.0)
EOS ABS: 0.1 10*3/uL (ref 0.0–0.5)
EOS%: 1.6 % (ref 0.0–7.0)
HCT: 30.3 % — ABNORMAL LOW (ref 38.7–49.9)
HGB: 9.8 g/dL — ABNORMAL LOW (ref 13.0–17.1)
LYMPH#: 1 10*3/uL (ref 0.9–3.3)
LYMPH%: 19.6 % (ref 14.0–48.0)
MCH: 28.1 pg (ref 28.0–33.4)
MCHC: 32.3 g/dL (ref 32.0–35.9)
MCV: 87 fL (ref 82–98)
MONO#: 0.8 10*3/uL (ref 0.1–0.9)
MONO%: 15.9 % — AB (ref 0.0–13.0)
NEUT#: 3.1 10*3/uL (ref 1.5–6.5)
NEUT%: 62.7 % (ref 40.0–80.0)
PLATELETS: 80 10*3/uL — AB (ref 145–400)
RBC: 3.49 10*6/uL — ABNORMAL LOW (ref 4.20–5.70)
RDW: 19.1 % — ABNORMAL HIGH (ref 11.1–15.7)
WBC: 4.9 10*3/uL (ref 4.0–10.0)

## 2013-11-16 MED ORDER — HEPARIN SOD (PORK) LOCK FLUSH 100 UNIT/ML IV SOLN
500.0000 [IU] | Freq: Once | INTRAVENOUS | Status: AC | PRN
  Administered 2013-11-16: 500 [IU]
  Filled 2013-11-16: qty 5

## 2013-11-16 MED ORDER — DEXAMETHASONE SODIUM PHOSPHATE 10 MG/ML IJ SOLN
INTRAMUSCULAR | Status: AC
  Filled 2013-11-16: qty 1

## 2013-11-16 MED ORDER — ONDANSETRON 8 MG/50ML IVPB (CHCC)
8.0000 mg | Freq: Once | INTRAVENOUS | Status: AC
  Administered 2013-11-16: 8 mg via INTRAVENOUS

## 2013-11-16 MED ORDER — SODIUM CHLORIDE 0.9 % IV SOLN
Freq: Once | INTRAVENOUS | Status: AC
  Administered 2013-11-16: 12:00:00 via INTRAVENOUS

## 2013-11-16 MED ORDER — DEXAMETHASONE SODIUM PHOSPHATE 10 MG/ML IJ SOLN
10.0000 mg | Freq: Once | INTRAMUSCULAR | Status: AC
  Administered 2013-11-16: 10 mg via INTRAVENOUS

## 2013-11-16 MED ORDER — DEXTROSE 5 % IV SOLN
56.0000 mg/m2 | Freq: Once | INTRAVENOUS | Status: AC
  Administered 2013-11-16: 120 mg via INTRAVENOUS
  Filled 2013-11-16: qty 60

## 2013-11-16 MED ORDER — SODIUM CHLORIDE 0.9 % IJ SOLN
10.0000 mL | INTRAMUSCULAR | Status: DC | PRN
  Administered 2013-11-16: 10 mL
  Filled 2013-11-16: qty 10

## 2013-11-16 NOTE — Progress Notes (Signed)
Spoke with Dr. Marin Olp about labs, Creatinine and platelet count. OK to treat.

## 2013-11-16 NOTE — Patient Instructions (Signed)
Cancer Center Discharge Instructions for Patients Receiving Chemotherapy  Today you received the following chemotherapy agents Kyprolis To help prevent nausea and vomiting after your treatment, we encourage you to take your nausea medication as prescribed.  If you develop nausea and vomiting that is not controlled by your nausea medication, call the clinic.   BELOW ARE SYMPTOMS THAT SHOULD BE REPORTED IMMEDIATELY:  *FEVER GREATER THAN 100.5 F  *CHILLS WITH OR WITHOUT FEVER  NAUSEA AND VOMITING THAT IS NOT CONTROLLED WITH YOUR NAUSEA MEDICATION  *UNUSUAL SHORTNESS OF BREATH  *UNUSUAL BRUISING OR BLEEDING  TENDERNESS IN MOUTH AND THROAT WITH OR WITHOUT PRESENCE OF ULCERS  *URINARY PROBLEMS  *BOWEL PROBLEMS  UNUSUAL RASH Items with * indicate a potential emergency and should be followed up as soon as possible.  Feel free to call the clinic you have any questions or concerns. The clinic phone number is [phone].    

## 2013-11-17 ENCOUNTER — Ambulatory Visit (HOSPITAL_BASED_OUTPATIENT_CLINIC_OR_DEPARTMENT_OTHER): Payer: Medicare HMO

## 2013-11-17 VITALS — BP 120/84 | HR 92 | Temp 97.8°F | Resp 20

## 2013-11-17 DIAGNOSIS — Z5112 Encounter for antineoplastic immunotherapy: Secondary | ICD-10-CM

## 2013-11-17 DIAGNOSIS — C9002 Multiple myeloma in relapse: Secondary | ICD-10-CM

## 2013-11-17 MED ORDER — DEXAMETHASONE SODIUM PHOSPHATE 10 MG/ML IJ SOLN
10.0000 mg | Freq: Once | INTRAMUSCULAR | Status: AC
  Administered 2013-11-17: 10 mg via INTRAVENOUS

## 2013-11-17 MED ORDER — HEPARIN SOD (PORK) LOCK FLUSH 100 UNIT/ML IV SOLN
500.0000 [IU] | Freq: Once | INTRAVENOUS | Status: AC | PRN
  Administered 2013-11-17: 500 [IU]
  Filled 2013-11-17: qty 5

## 2013-11-17 MED ORDER — DEXAMETHASONE SODIUM PHOSPHATE 10 MG/ML IJ SOLN
INTRAMUSCULAR | Status: AC
  Filled 2013-11-17: qty 1

## 2013-11-17 MED ORDER — SODIUM CHLORIDE 0.9 % IJ SOLN
10.0000 mL | INTRAMUSCULAR | Status: DC | PRN
  Administered 2013-11-17: 10 mL
  Filled 2013-11-17: qty 10

## 2013-11-17 MED ORDER — SODIUM CHLORIDE 0.9 % IV SOLN
Freq: Once | INTRAVENOUS | Status: AC
  Administered 2013-11-17: 13:00:00 via INTRAVENOUS

## 2013-11-17 MED ORDER — ONDANSETRON 8 MG/50ML IVPB (CHCC)
8.0000 mg | Freq: Once | INTRAVENOUS | Status: AC
  Administered 2013-11-17: 8 mg via INTRAVENOUS

## 2013-11-17 MED ORDER — DEXTROSE 5 % IV SOLN
56.0000 mg/m2 | Freq: Once | INTRAVENOUS | Status: AC
  Administered 2013-11-17: 120 mg via INTRAVENOUS
  Filled 2013-11-17: qty 60

## 2013-11-17 NOTE — Patient Instructions (Signed)
Carfilzomib injection What is this medicine? CARFILZOMIB (kar FILZ oh mib) is a chemotherapy drug that works by slowing or stopping cancer cell growth. This medicine is used to treat multiple myeloma. This medicine may be used for other purposes; ask your health care provider or pharmacist if you have questions. COMMON BRAND NAME(S): KYPROLIS What should I tell my health care provider before I take this medicine? They need to know if you have any of these conditions: -heart disease -irregular heartbeat -liver disease -lung or breathing disease -an unusual or allergic reaction to carfilzomib, or other medicines, foods, dyes, or preservatives -pregnant or trying to get pregnant -breast-feeding How should I use this medicine? This medicine is for injection or infusion into a vein. It is given by a health care professional in a hospital or clinic setting. Talk to your pediatrician regarding the use of this medicine in children. Special care may be needed. Overdosage: If you think you've taken too much of this medicine contact a poison control center or emergency room at once. Overdosage: If you think you have taken too much of this medicine contact a poison control center or emergency room at once. NOTE: This medicine is only for you. Do not share this medicine with others. What if I miss a dose? It is important not to miss your dose. Call your doctor or health care professional if you are unable to keep an appointment. What may interact with this medicine? Interactions are not expected. Give your health care provider a list of all the medicines, herbs, non-prescription drugs, or dietary supplements you use. Also tell them if you smoke, drink alcohol, or use illegal drugs. Some items may interact with your medicine. This list may not describe all possible interactions. Give your health care provider a list of all the medicines, herbs, non-prescription drugs, or dietary supplements you use. Also  tell them if you smoke, drink alcohol, or use illegal drugs. Some items may interact with your medicine. What should I watch for while using this medicine? Your condition will be monitored carefully while you are receiving this medicine. Report any side effects. Continue your course of treatment even though you feel ill unless your doctor tells you to stop. Call your doctor or health care professional for advice if you get a fever, chills or sore throat, or other symptoms of a cold or flu. Do not treat yourself. Try to avoid being around people who are sick. Do not become pregnant while taking this medicine. Women should inform their doctor if they wish to become pregnant or think they might be pregnant. There is a potential for serious side effects to an unborn child. Talk to your health care professional or pharmacist for more information. Do not breast-feed an infant while taking this medicine. Check with your doctor or health care professional if you get an attack of severe diarrhea, nausea and vomiting, or if you sweat a lot. The loss of too much body fluid can make it dangerous for you to take this medicine. You may get dizzy. Do not drive, use machinery, or do anything that needs mental alertness until you know how this medicine affects you. Do not stand or sit up quickly, especially if you are an older patient. This reduces the risk of dizzy or fainting spells. What side effects may I notice from receiving this medicine? Side effects that you should report to your doctor or health care professional as soon as possible: -allergic reactions like skin rash, itching or hives,  swelling of the face, lips, or tongue -breathing problems -chest pain or palpitationschest tightness -cough -dark urine -dizziness -feeling faint or lightheaded -fever or chills -general ill feeling or flu-like symptoms -light-colored stools -palpitations -right upper belly pain -swelling of the legs or ankles -unusual  bleeding or bruising -unusually weak or tired -yellowing of the eyes or skin  Side effects that usually do not require medical attention (Report these to your doctor or health care professional if they continue or are bothersome.): -diarrhea -headache -nausea, vomiting -tiredness This list may not describe all possible side effects. Call your doctor for medical advice about side effects. You may report side effects to FDA at 1-800-FDA-1088. Where should I keep my medicine? This drug is given in a hospital or clinic and will not be stored at home. NOTE: This sheet is a summary. It may not cover all possible information. If you have questions about this medicine, talk to your doctor, pharmacist, or health care provider.  2014, Elsevier/Gold Standard. (2012-01-22 17:02:29)  

## 2013-11-20 LAB — PROTEIN ELECTROPHORESIS, SERUM, WITH REFLEX
Albumin ELP: 60.6 % (ref 55.8–66.1)
Alpha-1-Globulin: 7.8 % — ABNORMAL HIGH (ref 2.9–4.9)
Alpha-2-Globulin: 14.8 % — ABNORMAL HIGH (ref 7.1–11.8)
Beta 2: 4.4 % (ref 3.2–6.5)
Beta Globulin: 7.5 % — ABNORMAL HIGH (ref 4.7–7.2)
Gamma Globulin: 4.9 % — ABNORMAL LOW (ref 11.1–18.8)
M-Spike, %: 0.14 g/dL
Total Protein, Serum Electrophoresis: 5.3 g/dL — ABNORMAL LOW (ref 6.0–8.3)

## 2013-11-20 LAB — KAPPA/LAMBDA LIGHT CHAINS
KAPPA FREE LGHT CHN: 0.15 mg/dL — AB (ref 0.33–1.94)
Kappa:Lambda Ratio: 0 — ABNORMAL LOW (ref 0.26–1.65)
Lambda Free Lght Chn: 280 mg/dL — ABNORMAL HIGH (ref 0.57–2.63)

## 2013-11-20 LAB — IFE INTERPRETATION

## 2013-11-20 LAB — LACTATE DEHYDROGENASE: LDH: 151 U/L (ref 94–250)

## 2013-11-20 LAB — IGG, IGA, IGM
IgA: 12 mg/dL — ABNORMAL LOW (ref 68–379)
IgG (Immunoglobin G), Serum: 284 mg/dL — ABNORMAL LOW (ref 650–1600)
IgM, Serum: <5 mg/dL — ABNORMAL LOW (ref 41–251)

## 2013-11-21 ENCOUNTER — Telehealth: Payer: Self-pay | Admitting: *Deleted

## 2013-11-21 NOTE — Telephone Encounter (Addendum)
Message copied by Lenn Sink on Tue Nov 21, 2013  1:10 PM ------      Message from: Volanda Napoleon      Created: Tue Nov 21, 2013  6:28 AM       Call - myeloma is better!!  Laurey Arrow ------Informed patient that myeloma is better!

## 2013-11-23 ENCOUNTER — Other Ambulatory Visit: Payer: Self-pay | Admitting: Pharmacist

## 2013-11-23 DIAGNOSIS — C9002 Multiple myeloma in relapse: Secondary | ICD-10-CM

## 2013-11-29 ENCOUNTER — Other Ambulatory Visit: Payer: Medicare HMO | Admitting: Lab

## 2013-11-29 ENCOUNTER — Other Ambulatory Visit: Payer: Self-pay | Admitting: *Deleted

## 2013-11-29 ENCOUNTER — Ambulatory Visit: Payer: Medicare HMO

## 2013-11-29 DIAGNOSIS — C9002 Multiple myeloma in relapse: Secondary | ICD-10-CM

## 2013-11-30 ENCOUNTER — Ambulatory Visit: Payer: Medicare HMO

## 2013-11-30 ENCOUNTER — Ambulatory Visit (HOSPITAL_BASED_OUTPATIENT_CLINIC_OR_DEPARTMENT_OTHER): Payer: Medicare HMO

## 2013-11-30 ENCOUNTER — Other Ambulatory Visit (HOSPITAL_BASED_OUTPATIENT_CLINIC_OR_DEPARTMENT_OTHER): Payer: Medicare HMO | Admitting: Lab

## 2013-11-30 VITALS — BP 108/68 | HR 84 | Temp 97.1°F | Resp 16

## 2013-11-30 DIAGNOSIS — C9002 Multiple myeloma in relapse: Secondary | ICD-10-CM

## 2013-11-30 DIAGNOSIS — Z5112 Encounter for antineoplastic immunotherapy: Secondary | ICD-10-CM

## 2013-11-30 LAB — CBC WITH DIFFERENTIAL (CANCER CENTER ONLY)
BASO#: 0 10*3/uL (ref 0.0–0.2)
BASO%: 0.2 % (ref 0.0–2.0)
EOS%: 1.6 % (ref 0.0–7.0)
Eosinophils Absolute: 0.1 10*3/uL (ref 0.0–0.5)
HCT: 28.1 % — ABNORMAL LOW (ref 38.7–49.9)
HEMOGLOBIN: 9.1 g/dL — AB (ref 13.0–17.1)
LYMPH#: 0.9 10*3/uL (ref 0.9–3.3)
LYMPH%: 19.7 % (ref 14.0–48.0)
MCH: 28.8 pg (ref 28.0–33.4)
MCHC: 32.4 g/dL (ref 32.0–35.9)
MCV: 89 fL (ref 82–98)
MONO#: 0.7 10*3/uL (ref 0.1–0.9)
MONO%: 15.9 % — ABNORMAL HIGH (ref 0.0–13.0)
NEUT#: 2.8 10*3/uL (ref 1.5–6.5)
NEUT%: 62.6 % (ref 40.0–80.0)
Platelets: 175 10*3/uL (ref 145–400)
RBC: 3.16 10*6/uL — ABNORMAL LOW (ref 4.20–5.70)
RDW: 19.4 % — AB (ref 11.1–15.7)
WBC: 4.5 10*3/uL (ref 4.0–10.0)

## 2013-11-30 LAB — CMP (CANCER CENTER ONLY)
ALT(SGPT): 12 U/L (ref 10–47)
AST: 18 U/L (ref 11–38)
Albumin: 3.1 g/dL — ABNORMAL LOW (ref 3.3–5.5)
Alkaline Phosphatase: 54 U/L (ref 26–84)
BUN: 19 mg/dL (ref 7–22)
CALCIUM: 8.7 mg/dL (ref 8.0–10.3)
CHLORIDE: 106 meq/L (ref 98–108)
CO2: 26 meq/L (ref 18–33)
Creat: 1.4 mg/dl — ABNORMAL HIGH (ref 0.6–1.2)
Glucose, Bld: 105 mg/dL (ref 73–118)
Potassium: 3.7 mEq/L (ref 3.3–4.7)
Sodium: 142 mEq/L (ref 128–145)
Total Bilirubin: 0.6 mg/dl (ref 0.20–1.60)
Total Protein: 6.3 g/dL — ABNORMAL LOW (ref 6.4–8.1)

## 2013-11-30 MED ORDER — DEXTROSE 5 % IV SOLN
56.0000 mg/m2 | Freq: Once | INTRAVENOUS | Status: AC
  Administered 2013-11-30: 120 mg via INTRAVENOUS
  Filled 2013-11-30: qty 60

## 2013-11-30 MED ORDER — ZOLEDRONIC ACID 4 MG/5ML IV CONC
3.5000 mg | Freq: Once | INTRAVENOUS | Status: AC
  Administered 2013-11-30: 3.5 mg via INTRAVENOUS
  Filled 2013-11-30: qty 4.38

## 2013-11-30 MED ORDER — SODIUM CHLORIDE 0.9 % IV SOLN
Freq: Once | INTRAVENOUS | Status: AC
Start: 2013-11-30 — End: 2013-11-30
  Administered 2013-11-30: 14:00:00 via INTRAVENOUS

## 2013-11-30 MED ORDER — DEXAMETHASONE SODIUM PHOSPHATE 10 MG/ML IJ SOLN
10.0000 mg | Freq: Once | INTRAMUSCULAR | Status: AC
  Administered 2013-11-30: 10 mg via INTRAVENOUS

## 2013-11-30 MED ORDER — HEPARIN SOD (PORK) LOCK FLUSH 100 UNIT/ML IV SOLN
500.0000 [IU] | Freq: Once | INTRAVENOUS | Status: AC | PRN
  Administered 2013-11-30: 500 [IU]
  Filled 2013-11-30: qty 5

## 2013-11-30 MED ORDER — SODIUM CHLORIDE 0.9 % IJ SOLN
10.0000 mL | INTRAMUSCULAR | Status: DC | PRN
  Administered 2013-11-30: 10 mL
  Filled 2013-11-30: qty 10

## 2013-11-30 MED ORDER — DEXAMETHASONE SODIUM PHOSPHATE 10 MG/ML IJ SOLN
INTRAMUSCULAR | Status: AC
  Filled 2013-11-30: qty 1

## 2013-11-30 MED ORDER — ONDANSETRON 8 MG/50ML IVPB (CHCC)
8.0000 mg | Freq: Once | INTRAVENOUS | Status: AC
  Administered 2013-11-30: 8 mg via INTRAVENOUS

## 2013-11-30 NOTE — Patient Instructions (Signed)
Cancer Center Discharge Instructions for Patients Receiving Chemotherapy  Today you received the following chemotherapy agents Kyprolis To help prevent nausea and vomiting after your treatment, we encourage you to take your nausea medication as prescribed.  If you develop nausea and vomiting that is not controlled by your nausea medication, call the clinic.   BELOW ARE SYMPTOMS THAT SHOULD BE REPORTED IMMEDIATELY:  *FEVER GREATER THAN 100.5 F  *CHILLS WITH OR WITHOUT FEVER  NAUSEA AND VOMITING THAT IS NOT CONTROLLED WITH YOUR NAUSEA MEDICATION  *UNUSUAL SHORTNESS OF BREATH  *UNUSUAL BRUISING OR BLEEDING  TENDERNESS IN MOUTH AND THROAT WITH OR WITHOUT PRESENCE OF ULCERS  *URINARY PROBLEMS  *BOWEL PROBLEMS  UNUSUAL RASH Items with * indicate a potential emergency and should be followed up as soon as possible.  Feel free to call the clinic you have any questions or concerns. The clinic phone number is [phone].    

## 2013-12-01 ENCOUNTER — Ambulatory Visit (HOSPITAL_BASED_OUTPATIENT_CLINIC_OR_DEPARTMENT_OTHER): Payer: Medicare HMO

## 2013-12-01 VITALS — BP 127/69 | HR 89 | Temp 97.5°F | Resp 18

## 2013-12-01 DIAGNOSIS — Z5112 Encounter for antineoplastic immunotherapy: Secondary | ICD-10-CM

## 2013-12-01 DIAGNOSIS — C9002 Multiple myeloma in relapse: Secondary | ICD-10-CM

## 2013-12-01 MED ORDER — ONDANSETRON 8 MG/50ML IVPB (CHCC)
8.0000 mg | Freq: Once | INTRAVENOUS | Status: AC
  Administered 2013-12-01: 8 mg via INTRAVENOUS

## 2013-12-01 MED ORDER — SODIUM CHLORIDE 0.9 % IV SOLN
Freq: Once | INTRAVENOUS | Status: AC
  Administered 2013-12-01: 13:00:00 via INTRAVENOUS

## 2013-12-01 MED ORDER — DEXAMETHASONE SODIUM PHOSPHATE 10 MG/ML IJ SOLN
10.0000 mg | Freq: Once | INTRAMUSCULAR | Status: AC
  Administered 2013-12-01: 10 mg via INTRAVENOUS

## 2013-12-01 MED ORDER — DEXAMETHASONE SODIUM PHOSPHATE 10 MG/ML IJ SOLN
INTRAMUSCULAR | Status: AC
  Filled 2013-12-01: qty 1

## 2013-12-01 MED ORDER — DEXTROSE 5 % IV SOLN
56.0000 mg/m2 | Freq: Once | INTRAVENOUS | Status: AC
  Administered 2013-12-01: 120 mg via INTRAVENOUS
  Filled 2013-12-01: qty 60

## 2013-12-01 MED ORDER — HEPARIN SOD (PORK) LOCK FLUSH 100 UNIT/ML IV SOLN
500.0000 [IU] | Freq: Once | INTRAVENOUS | Status: AC | PRN
  Administered 2013-12-01: 500 [IU]
  Filled 2013-12-01: qty 5

## 2013-12-01 MED ORDER — SODIUM CHLORIDE 0.9 % IV SOLN: Freq: Once | INTRAVENOUS | Status: DC

## 2013-12-01 MED ORDER — SODIUM CHLORIDE 0.9 % IJ SOLN
10.0000 mL | INTRAMUSCULAR | Status: DC | PRN
  Administered 2013-12-01: 10 mL
  Filled 2013-12-01: qty 10

## 2013-12-01 NOTE — Patient Instructions (Signed)
[facility] Discharge Instructions for Patients Receiving Chemotherapy  Today you received the following chemotherapy agents Kyprolis To help prevent nausea and vomiting after your treatment, we encourage you to take your nausea medication as prescribed.  If you develop nausea and vomiting that is not controlled by your nausea medication, call the clinic.   BELOW ARE SYMPTOMS THAT SHOULD BE REPORTED IMMEDIATELY:  *FEVER GREATER THAN 100.5 F  *CHILLS WITH OR WITHOUT FEVER  NAUSEA AND VOMITING THAT IS NOT CONTROLLED WITH YOUR NAUSEA MEDICATION  *UNUSUAL SHORTNESS OF BREATH  *UNUSUAL BRUISING OR BLEEDING  TENDERNESS IN MOUTH AND THROAT WITH OR WITHOUT PRESENCE OF ULCERS  *URINARY PROBLEMS  *BOWEL PROBLEMS  UNUSUAL RASH Items with * indicate a potential emergency and should be followed up as soon as possible.  Feel free to call the clinic you have any questions or concerns. The clinic phone number is [phone].    

## 2013-12-07 ENCOUNTER — Other Ambulatory Visit (HOSPITAL_BASED_OUTPATIENT_CLINIC_OR_DEPARTMENT_OTHER): Payer: Medicare HMO | Admitting: Lab

## 2013-12-07 ENCOUNTER — Ambulatory Visit (HOSPITAL_BASED_OUTPATIENT_CLINIC_OR_DEPARTMENT_OTHER): Payer: Medicare HMO

## 2013-12-07 VITALS — BP 129/72 | HR 110 | Temp 96.2°F | Resp 18

## 2013-12-07 DIAGNOSIS — Z5112 Encounter for antineoplastic immunotherapy: Secondary | ICD-10-CM

## 2013-12-07 DIAGNOSIS — C9002 Multiple myeloma in relapse: Secondary | ICD-10-CM

## 2013-12-07 LAB — CMP (CANCER CENTER ONLY)
ALBUMIN: 3.2 g/dL — AB (ref 3.3–5.5)
ALK PHOS: 60 U/L (ref 26–84)
ALT: 14 U/L (ref 10–47)
AST: 20 U/L (ref 11–38)
BUN, Bld: 20 mg/dL (ref 7–22)
CALCIUM: 8.9 mg/dL (ref 8.0–10.3)
CHLORIDE: 107 meq/L (ref 98–108)
CO2: 26 mEq/L (ref 18–33)
Creat: 1.4 mg/dl — ABNORMAL HIGH (ref 0.6–1.2)
Glucose, Bld: 157 mg/dL — ABNORMAL HIGH (ref 73–118)
POTASSIUM: 3.6 meq/L (ref 3.3–4.7)
Sodium: 141 mEq/L (ref 128–145)
Total Bilirubin: 0.8 mg/dl (ref 0.20–1.60)
Total Protein: 6.3 g/dL — ABNORMAL LOW (ref 6.4–8.1)

## 2013-12-07 LAB — CBC WITH DIFFERENTIAL (CANCER CENTER ONLY)
BASO#: 0 10*3/uL (ref 0.0–0.2)
BASO%: 0.2 % (ref 0.0–2.0)
EOS%: 1.5 % (ref 0.0–7.0)
Eosinophils Absolute: 0.1 10*3/uL (ref 0.0–0.5)
HCT: 26.9 % — ABNORMAL LOW (ref 38.7–49.9)
HGB: 8.7 g/dL — ABNORMAL LOW (ref 13.0–17.1)
LYMPH#: 0.8 10*3/uL — AB (ref 0.9–3.3)
LYMPH%: 15.4 % (ref 14.0–48.0)
MCH: 28.2 pg (ref 28.0–33.4)
MCHC: 32.3 g/dL (ref 32.0–35.9)
MCV: 87 fL (ref 82–98)
MONO#: 0.9 10*3/uL (ref 0.1–0.9)
MONO%: 17.3 % — AB (ref 0.0–13.0)
NEUT#: 3.5 10*3/uL (ref 1.5–6.5)
NEUT%: 65.6 % (ref 40.0–80.0)
PLATELETS: 122 10*3/uL — AB (ref 145–400)
RBC: 3.09 10*6/uL — ABNORMAL LOW (ref 4.20–5.70)
RDW: 19.6 % — AB (ref 11.1–15.7)
WBC: 5.3 10*3/uL (ref 4.0–10.0)

## 2013-12-07 MED ORDER — DEXAMETHASONE SODIUM PHOSPHATE 10 MG/ML IJ SOLN
10.0000 mg | Freq: Once | INTRAMUSCULAR | Status: AC
  Administered 2013-12-07: 10 mg via INTRAVENOUS

## 2013-12-07 MED ORDER — HYDROMORPHONE HCL PF 1 MG/ML IJ SOLN
2.0000 mg | Freq: Once | INTRAMUSCULAR | Status: DC
  Administered 2013-12-07: 2 mg via INTRAVENOUS

## 2013-12-07 MED ORDER — DEXAMETHASONE SODIUM PHOSPHATE 10 MG/ML IJ SOLN
INTRAMUSCULAR | Status: AC
  Filled 2013-12-07: qty 1

## 2013-12-07 MED ORDER — HEPARIN SOD (PORK) LOCK FLUSH 100 UNIT/ML IV SOLN
500.0000 [IU] | Freq: Once | INTRAVENOUS | Status: AC | PRN
  Administered 2013-12-07: 500 [IU]
  Filled 2013-12-07: qty 5

## 2013-12-07 MED ORDER — DEXTROSE 5 % IV SOLN
56.0000 mg/m2 | Freq: Once | INTRAVENOUS | Status: AC
  Administered 2013-12-07: 120 mg via INTRAVENOUS
  Filled 2013-12-07: qty 60

## 2013-12-07 MED ORDER — SODIUM CHLORIDE 0.9 % IJ SOLN
3.0000 mL | INTRAMUSCULAR | Status: DC | PRN
  Filled 2013-12-07: qty 10

## 2013-12-07 MED ORDER — HYDROMORPHONE HCL PF 2 MG/ML IJ SOLN
2.0000 mg | Freq: Once | INTRAMUSCULAR | Status: DC
Start: 2013-12-07 — End: 2013-12-07

## 2013-12-07 MED ORDER — SODIUM CHLORIDE 0.9 % IV SOLN: Freq: Once | INTRAVENOUS | Status: DC

## 2013-12-07 MED ORDER — SODIUM CHLORIDE 0.9 % IJ SOLN
10.0000 mL | INTRAMUSCULAR | Status: DC | PRN
  Administered 2013-12-07: 10 mL
  Filled 2013-12-07: qty 10

## 2013-12-07 MED ORDER — ONDANSETRON 8 MG/50ML IVPB (CHCC)
8.0000 mg | Freq: Once | INTRAVENOUS | Status: AC
  Administered 2013-12-07: 8 mg via INTRAVENOUS

## 2013-12-07 MED ORDER — SODIUM CHLORIDE 0.9 % IV SOLN
Freq: Once | INTRAVENOUS | Status: AC
  Administered 2013-12-07: 13:00:00 via INTRAVENOUS

## 2013-12-07 MED ORDER — ALTEPLASE 2 MG IJ SOLR
2.0000 mg | Freq: Once | INTRAMUSCULAR | Status: DC | PRN
  Filled 2013-12-07: qty 2

## 2013-12-07 MED ORDER — HEPARIN SOD (PORK) LOCK FLUSH 100 UNIT/ML IV SOLN
250.0000 [IU] | Freq: Once | INTRAVENOUS | Status: DC | PRN
  Filled 2013-12-07: qty 5

## 2013-12-07 MED ORDER — HYDROMORPHONE HCL PF 4 MG/ML IJ SOLN: 2.0000 mg | Freq: Once | INTRAMUSCULAR | Status: DC

## 2013-12-07 MED ORDER — HYDROMORPHONE HCL PF 2 MG/ML IJ SOLN
INTRAMUSCULAR | Status: AC
  Filled 2013-12-07: qty 1

## 2013-12-07 NOTE — Patient Instructions (Signed)
Carfilzomib injection What is this medicine? CARFILZOMIB (kar FILZ oh mib) is a chemotherapy drug that works by slowing or stopping cancer cell growth. This medicine is used to treat multiple myeloma. This medicine may be used for other purposes; ask your health care provider or pharmacist if you have questions. COMMON BRAND NAME(S): KYPROLIS What should I tell my health care provider before I take this medicine? They need to know if you have any of these conditions: -heart disease -irregular heartbeat -liver disease -lung or breathing disease -an unusual or allergic reaction to carfilzomib, or other medicines, foods, dyes, or preservatives -pregnant or trying to get pregnant -breast-feeding How should I use this medicine? This medicine is for injection or infusion into a vein. It is given by a health care professional in a hospital or clinic setting. Talk to your pediatrician regarding the use of this medicine in children. Special care may be needed. Overdosage: If you think you've taken too much of this medicine contact a poison control center or emergency room at once. Overdosage: If you think you have taken too much of this medicine contact a poison control center or emergency room at once. NOTE: This medicine is only for you. Do not share this medicine with others. What if I miss a dose? It is important not to miss your dose. Call your doctor or health care professional if you are unable to keep an appointment. What may interact with this medicine? Interactions are not expected. Give your health care provider a list of all the medicines, herbs, non-prescription drugs, or dietary supplements you use. Also tell them if you smoke, drink alcohol, or use illegal drugs. Some items may interact with your medicine. This list may not describe all possible interactions. Give your health care provider a list of all the medicines, herbs, non-prescription drugs, or dietary supplements you use. Also  tell them if you smoke, drink alcohol, or use illegal drugs. Some items may interact with your medicine. What should I watch for while using this medicine? Your condition will be monitored carefully while you are receiving this medicine. Report any side effects. Continue your course of treatment even though you feel ill unless your doctor tells you to stop. Call your doctor or health care professional for advice if you get a fever, chills or sore throat, or other symptoms of a cold or flu. Do not treat yourself. Try to avoid being around people who are sick. Do not become pregnant while taking this medicine. Women should inform their doctor if they wish to become pregnant or think they might be pregnant. There is a potential for serious side effects to an unborn child. Talk to your health care professional or pharmacist for more information. Do not breast-feed an infant while taking this medicine. Check with your doctor or health care professional if you get an attack of severe diarrhea, nausea and vomiting, or if you sweat a lot. The loss of too much body fluid can make it dangerous for you to take this medicine. You may get dizzy. Do not drive, use machinery, or do anything that needs mental alertness until you know how this medicine affects you. Do not stand or sit up quickly, especially if you are an older patient. This reduces the risk of dizzy or fainting spells. What side effects may I notice from receiving this medicine? Side effects that you should report to your doctor or health care professional as soon as possible: -allergic reactions like skin rash, itching or hives,  swelling of the face, lips, or tongue -breathing problems -chest pain or palpitationschest tightness -cough -dark urine -dizziness -feeling faint or lightheaded -fever or chills -general ill feeling or flu-like symptoms -light-colored stools -palpitations -right upper belly pain -swelling of the legs or ankles -unusual  bleeding or bruising -unusually weak or tired -yellowing of the eyes or skin  Side effects that usually do not require medical attention (Report these to your doctor or health care professional if they continue or are bothersome.): -diarrhea -headache -nausea, vomiting -tiredness This list may not describe all possible side effects. Call your doctor for medical advice about side effects. You may report side effects to FDA at 1-800-FDA-1088. Where should I keep my medicine? This drug is given in a hospital or clinic and will not be stored at home. NOTE: This sheet is a summary. It may not cover all possible information. If you have questions about this medicine, talk to your doctor, pharmacist, or health care provider.  2014, Elsevier/Gold Standard. (2012-01-22 17:02:29)  

## 2013-12-08 ENCOUNTER — Ambulatory Visit (HOSPITAL_BASED_OUTPATIENT_CLINIC_OR_DEPARTMENT_OTHER): Payer: Medicare HMO

## 2013-12-08 VITALS — BP 139/76 | HR 99 | Temp 97.9°F | Resp 16

## 2013-12-08 DIAGNOSIS — C9002 Multiple myeloma in relapse: Secondary | ICD-10-CM

## 2013-12-08 DIAGNOSIS — Z5112 Encounter for antineoplastic immunotherapy: Secondary | ICD-10-CM

## 2013-12-08 MED ORDER — HYDROMORPHONE HCL PF 2 MG/ML IJ SOLN
INTRAMUSCULAR | Status: AC
  Filled 2013-12-08: qty 1

## 2013-12-08 MED ORDER — DEXAMETHASONE SODIUM PHOSPHATE 10 MG/ML IJ SOLN
10.0000 mg | Freq: Once | INTRAMUSCULAR | Status: AC
  Administered 2013-12-08: 10 mg via INTRAVENOUS

## 2013-12-08 MED ORDER — SODIUM CHLORIDE 0.9 % IJ SOLN
10.0000 mL | INTRAMUSCULAR | Status: DC | PRN
  Administered 2013-12-08: 10 mL
  Filled 2013-12-08: qty 10

## 2013-12-08 MED ORDER — HEPARIN SOD (PORK) LOCK FLUSH 100 UNIT/ML IV SOLN
500.0000 [IU] | Freq: Once | INTRAVENOUS | Status: AC | PRN
  Administered 2013-12-08: 500 [IU]
  Filled 2013-12-08: qty 5

## 2013-12-08 MED ORDER — DEXAMETHASONE SODIUM PHOSPHATE 10 MG/ML IJ SOLN
INTRAMUSCULAR | Status: AC
  Filled 2013-12-08: qty 1

## 2013-12-08 MED ORDER — SODIUM CHLORIDE 0.9 % IV SOLN
Freq: Once | INTRAVENOUS | Status: AC
  Administered 2013-12-08: 13:00:00 via INTRAVENOUS

## 2013-12-08 MED ORDER — HYDROMORPHONE HCL 2 MG PO TABS: 2.0000 mg | ORAL_TABLET | Freq: Four times a day (QID) | ORAL | Status: DC | PRN

## 2013-12-08 MED ORDER — HYDROMORPHONE HCL PF 1 MG/ML IJ SOLN
2.0000 mg | Freq: Once | INTRAMUSCULAR | Status: AC
  Administered 2013-12-08: 2 mg via INTRAVENOUS
  Filled 2013-12-08: qty 2

## 2013-12-08 MED ORDER — DEXTROSE 5 % IV SOLN
56.0000 mg/m2 | Freq: Once | INTRAVENOUS | Status: AC
  Administered 2013-12-08: 120 mg via INTRAVENOUS
  Filled 2013-12-08: qty 60

## 2013-12-08 MED ORDER — ONDANSETRON 8 MG/50ML IVPB (CHCC)
8.0000 mg | Freq: Once | INTRAVENOUS | Status: AC
  Administered 2013-12-08: 8 mg via INTRAVENOUS

## 2013-12-08 NOTE — Patient Instructions (Signed)
Hydromorphone tablets What is this medicine? HYDROMORPHONE (hye droe MOR fone) is a pain reliever. It is used to treat moderate to severe pain. This medicine may be used for other purposes; ask your health care provider or pharmacist if you have questions. COMMON BRAND NAME(S): Dilaudid What should I tell my health care provider before I take this medicine? They need to know if you have any of these conditions: -brain tumor -drug abuse or addiction -head injury -heart disease -frequently drink alcohol containing drinks -kidney disease or problems going to the bathroom -liver disease -lung disease, asthma, or breathing problems -mental problems -an allergic or unusual reaction to lactose, hydromorphone, other opioid analgesics, other medicines, sulfites, foods, dyes, or preservatives -pregnant or trying to get pregnant -breast-feeding How should I use this medicine? Take this medicine by mouth with a glass of water. If the medicine upsets your stomach, take it with food or milk. Follow the directions on the prescription label. Do not take more medicine than you are told to take. Talk to your pediatrician regarding the use of this medicine in children. Special care may be needed. Overdosage: If you think you have taken too much of this medicine contact a poison control center or emergency room at once. NOTE: This medicine is only for you. Do not share this medicine with others. What if I miss a dose? If you miss a dose, take it as soon as you can. If it is almost time for your next dose, take only that dose. Do not take double or extra doses. What may interact with this medicine? -alcohol -antihistamines for allergy, cough and cold -medicines for anesthesia -medicines for depression, anxiety, or psychotic disturbances -medicines for sleep -muscle relaxants -naltrexone -narcotic medicines (opiates) for pain -phenothiazines like chlorpromazine, mesoridazine, prochlorperazine,  thioridazine -tramadol This list may not describe all possible interactions. Give your health care provider a list of all the medicines, herbs, non-prescription drugs, or dietary supplements you use. Also tell them if you smoke, drink alcohol, or use illegal drugs. Some items may interact with your medicine. What should I watch for while using this medicine? Tell your doctor or health care professional if your pain does not go away, if it gets worse, or if you have new or a different type of pain. You may develop tolerance to the medicine. Tolerance means that you will need a higher dose of the medicine for pain relief. Tolerance is normal and is expected if you take this medicine for a long time. Do not suddenly stop taking your medicine because you may develop a severe reaction. Your body becomes used to the medicine. This does NOT mean you are addicted. Addiction is a behavior related to getting and using a drug for a non-medical reason. If you have pain, you have a medical reason to take pain medicine. Your doctor will tell you how much medicine to take. If your doctor wants you to stop the medicine, the dose will be slowly lowered over time to avoid any side effects. You may get drowsy or dizzy. Do not drive, use machinery, or do anything that needs mental alertness until you know how this medicine affects you. Do not stand or sit up quickly, especially if you are an older patient. This reduces the risk of dizzy or fainting spells. Alcohol may interfere with the effect of this medicine. Avoid alcoholic drinks. There are different types of narcotic medicines (opiates) for pain. If you take more than one type at the same time, you  may have more side effects. Give your health care provider a list of all medicines you use. Your doctor will tell you how much medicine to take. Do not take more medicine than directed. Call emergency for help if you have problems breathing. This medicine will cause  constipation. Try to have a bowel movement at least every 2 to 3 days. If you do not have a bowel movement for 3 days, call your doctor or health care professional. Your mouth may get dry. Chewing sugarless gum or sucking hard candy, and drinking plenty of water may help. Contact your doctor if the problem does not go away or is severe. What side effects may I notice from receiving this medicine? Side effects that you should report to your doctor or health care professional as soon as possible: -allergic reactions like skin rash, itching or hives, swelling of the face, lips, or tongue -breathing problems -changes in vision -confusion -feeling faint or lightheaded, falls -seizures -slow or fast heartbeat -trouble passing urine or change in the amount of urine -trouble with balance, talking, walking -unusually weak or tired  Side effects that usually do not require medical attention (report to your doctor or health care professional if they continue or are bothersome): -difficulty sleeping -drowsiness -dry mouth -flushing -headache -itching -loss of appetite -nausea, vomiting This list may not describe all possible side effects. Call your doctor for medical advice about side effects. You may report side effects to FDA at 1-800-FDA-1088. Where should I keep my medicine? Keep out of the reach of children. This medicine can be abused. Keep your medicine in a safe place to protect it from theft. Do not share this medicine with anyone. Selling or giving away this medicine is dangerous and against the law. Store at room temperature between 15 and 30 degrees C (59 and 86 degrees F). Keep container tightly closed. Protect from light. This medicine may cause accidental overdose and death if it is taken by other adults, children, or pets. Flush any unused medicine down the toilet to reduce the chance of harm. Do not use the medicine after the expiration date. NOTE: This sheet is a summary. It may not  cover all possible information. If you have questions about this medicine, talk to your doctor, pharmacist, or health care provider.  2014, Elsevier/Gold Standard. (2013-03-07 09:50:15) Carfilzomib injection What is this medicine? CARFILZOMIB (kar FILZ oh mib) is a chemotherapy drug that works by slowing or stopping cancer cell growth. This medicine is used to treat multiple myeloma. This medicine may be used for other purposes; ask your health care provider or pharmacist if you have questions. COMMON BRAND NAME(S): KYPROLIS What should I tell my health care provider before I take this medicine? They need to know if you have any of these conditions: -heart disease -irregular heartbeat -liver disease -lung or breathing disease -an unusual or allergic reaction to carfilzomib, or other medicines, foods, dyes, or preservatives -pregnant or trying to get pregnant -breast-feeding How should I use this medicine? This medicine is for injection or infusion into a vein. It is given by a health care professional in a hospital or clinic setting. Talk to your pediatrician regarding the use of this medicine in children. Special care may be needed. Overdosage: If you think you've taken too much of this medicine contact a poison control center or emergency room at once. Overdosage: If you think you have taken too much of this medicine contact a poison control center or emergency room at once.  NOTE: This medicine is only for you. Do not share this medicine with others. What if I miss a dose? It is important not to miss your dose. Call your doctor or health care professional if you are unable to keep an appointment. What may interact with this medicine? Interactions are not expected. Give your health care provider a list of all the medicines, herbs, non-prescription drugs, or dietary supplements you use. Also tell them if you smoke, drink alcohol, or use illegal drugs. Some items may interact with your  medicine. This list may not describe all possible interactions. Give your health care provider a list of all the medicines, herbs, non-prescription drugs, or dietary supplements you use. Also tell them if you smoke, drink alcohol, or use illegal drugs. Some items may interact with your medicine. What should I watch for while using this medicine? Your condition will be monitored carefully while you are receiving this medicine. Report any side effects. Continue your course of treatment even though you feel ill unless your doctor tells you to stop. Call your doctor or health care professional for advice if you get a fever, chills or sore throat, or other symptoms of a cold or flu. Do not treat yourself. Try to avoid being around people who are sick. Do not become pregnant while taking this medicine. Women should inform their doctor if they wish to become pregnant or think they might be pregnant. There is a potential for serious side effects to an unborn child. Talk to your health care professional or pharmacist for more information. Do not breast-feed an infant while taking this medicine. Check with your doctor or health care professional if you get an attack of severe diarrhea, nausea and vomiting, or if you sweat a lot. The loss of too much body fluid can make it dangerous for you to take this medicine. You may get dizzy. Do not drive, use machinery, or do anything that needs mental alertness until you know how this medicine affects you. Do not stand or sit up quickly, especially if you are an older patient. This reduces the risk of dizzy or fainting spells. What side effects may I notice from receiving this medicine? Side effects that you should report to your doctor or health care professional as soon as possible: -allergic reactions like skin rash, itching or hives, swelling of the face, lips, or tongue -breathing problems -chest pain or palpitationschest tightness -cough -dark  urine -dizziness -feeling faint or lightheaded -fever or chills -general ill feeling or flu-like symptoms -light-colored stools -palpitations -right upper belly pain -swelling of the legs or ankles -unusual bleeding or bruising -unusually weak or tired -yellowing of the eyes or skin  Side effects that usually do not require medical attention (Report these to your doctor or health care professional if they continue or are bothersome.): -diarrhea -headache -nausea, vomiting -tiredness This list may not describe all possible side effects. Call your doctor for medical advice about side effects. You may report side effects to FDA at 1-800-FDA-1088. Where should I keep my medicine? This drug is given in a hospital or clinic and will not be stored at home. NOTE: This sheet is a summary. It may not cover all possible information. If you have questions about this medicine, talk to your doctor, pharmacist, or health care provider.  2014, Elsevier/Gold Standard. (2012-01-22 17:02:29)

## 2013-12-13 ENCOUNTER — Other Ambulatory Visit: Payer: Self-pay | Admitting: *Deleted

## 2013-12-13 DIAGNOSIS — C9002 Multiple myeloma in relapse: Secondary | ICD-10-CM

## 2013-12-14 ENCOUNTER — Ambulatory Visit (HOSPITAL_COMMUNITY)
Admission: RE | Admit: 2013-12-14 | Discharge: 2013-12-14 | Disposition: A | Discharge status: Home / Self Care | Payer: Medicare HMO | Source: Ambulatory Visit | Attending: Hematology & Oncology | Admitting: Hematology & Oncology

## 2013-12-14 ENCOUNTER — Other Ambulatory Visit (HOSPITAL_BASED_OUTPATIENT_CLINIC_OR_DEPARTMENT_OTHER): Payer: Medicare HMO | Admitting: Lab

## 2013-12-14 ENCOUNTER — Ambulatory Visit (HOSPITAL_BASED_OUTPATIENT_CLINIC_OR_DEPARTMENT_OTHER): Payer: Medicare HMO

## 2013-12-14 ENCOUNTER — Other Ambulatory Visit: Payer: Self-pay | Admitting: *Deleted

## 2013-12-14 VITALS — BP 128/75 | HR 100 | Temp 98.0°F | Resp 16

## 2013-12-14 DIAGNOSIS — C9002 Multiple myeloma in relapse: Secondary | ICD-10-CM

## 2013-12-14 DIAGNOSIS — D649 Anemia, unspecified: Secondary | ICD-10-CM | POA: Insufficient documentation

## 2013-12-14 LAB — CBC WITH DIFFERENTIAL (CANCER CENTER ONLY)
BASO#: 0 10*3/uL (ref 0.0–0.2)
BASO%: 0.2 % (ref 0.0–2.0)
EOS ABS: 0.1 10*3/uL (ref 0.0–0.5)
EOS%: 1.3 % (ref 0.0–7.0)
HCT: 25.1 % — ABNORMAL LOW (ref 38.7–49.9)
HGB: 8 g/dL — ABNORMAL LOW (ref 13.0–17.1)
LYMPH#: 0.8 10*3/uL — ABNORMAL LOW (ref 0.9–3.3)
LYMPH%: 14.4 % (ref 14.0–48.0)
MCH: 28.7 pg (ref 28.0–33.4)
MCHC: 31.9 g/dL — AB (ref 32.0–35.9)
MCV: 90 fL (ref 82–98)
MONO#: 0.9 10*3/uL (ref 0.1–0.9)
MONO%: 17 % — AB (ref 0.0–13.0)
NEUT#: 3.7 10*3/uL (ref 1.5–6.5)
NEUT%: 67.1 % (ref 40.0–80.0)
Platelets: 97 10*3/uL — ABNORMAL LOW (ref 145–400)
RBC: 2.79 10*6/uL — ABNORMAL LOW (ref 4.20–5.70)
RDW: 20.3 % — ABNORMAL HIGH (ref 11.1–15.7)
WBC: 5.5 10*3/uL (ref 4.0–10.0)

## 2013-12-14 LAB — COMPREHENSIVE METABOLIC PANEL
ALK PHOS: 53 U/L (ref 39–117)
ALT: <8 U/L (ref 0–53)
AST: 12 U/L (ref 0–37)
Albumin: 3.3 g/dL — ABNORMAL LOW (ref 3.5–5.2)
BILIRUBIN TOTAL: 0.5 mg/dL (ref 0.2–1.2)
BUN: 17 mg/dL (ref 6–23)
CO2: 22 mEq/L (ref 19–32)
Calcium: 8 mg/dL — ABNORMAL LOW (ref 8.4–10.5)
Chloride: 109 mEq/L (ref 96–112)
Creatinine, Ser: 1.22 mg/dL (ref 0.50–1.35)
Glucose, Bld: 132 mg/dL — ABNORMAL HIGH (ref 70–99)
Potassium: 3.6 mEq/L (ref 3.5–5.3)
Sodium: 140 mEq/L (ref 135–145)
Total Protein: 5.2 g/dL — ABNORMAL LOW (ref 6.0–8.3)

## 2013-12-14 LAB — PREPARE RBC (CROSSMATCH)

## 2013-12-14 LAB — HOLD TUBE, BLOOD BANK - CHCC SATELLITE

## 2013-12-14 MED ORDER — SODIUM CHLORIDE 0.9 % IJ SOLN
10.0000 mL | INTRAMUSCULAR | Status: DC | PRN
  Administered 2013-12-14: 10 mL via INTRAVENOUS
  Filled 2013-12-14: qty 10

## 2013-12-14 MED ORDER — HEPARIN SOD (PORK) LOCK FLUSH 100 UNIT/ML IV SOLN
500.0000 [IU] | Freq: Once | INTRAVENOUS | Status: AC
  Administered 2013-12-14: 500 [IU] via INTRAVENOUS
  Filled 2013-12-14: qty 5

## 2013-12-14 NOTE — Patient Instructions (Signed)

## 2013-12-14 NOTE — Progress Notes (Signed)
Hgb 8.0 today.  Dr. Marin Olp notified and chemotherapy canceled for today.  Pt to get 2 [facility] tomorrow.  Pharmacy notified.

## 2013-12-15 ENCOUNTER — Ambulatory Visit (HOSPITAL_COMMUNITY)
Admission: RE | Admit: 2013-12-15 | Discharge: 2013-12-15 | Disposition: A | Discharge status: Home / Self Care | Payer: Medicare HMO | Source: Ambulatory Visit | Attending: Hematology & Oncology | Admitting: Hematology & Oncology

## 2013-12-15 ENCOUNTER — Ambulatory Visit: Payer: Medicare HMO

## 2013-12-15 DIAGNOSIS — C9000 Multiple myeloma not having achieved remission: Secondary | ICD-10-CM | POA: Insufficient documentation

## 2013-12-16 ENCOUNTER — Ambulatory Visit (HOSPITAL_BASED_OUTPATIENT_CLINIC_OR_DEPARTMENT_OTHER): Payer: Medicare HMO

## 2013-12-16 VITALS — BP 150/73 | HR 75 | Temp 97.3°F | Resp 18

## 2013-12-16 DIAGNOSIS — C9002 Multiple myeloma in relapse: Secondary | ICD-10-CM

## 2013-12-16 DIAGNOSIS — N189 Chronic kidney disease, unspecified: Secondary | ICD-10-CM

## 2013-12-16 DIAGNOSIS — N039 Chronic nephritic syndrome with unspecified morphologic changes: Secondary | ICD-10-CM

## 2013-12-16 DIAGNOSIS — D631 Anemia in chronic kidney disease: Secondary | ICD-10-CM

## 2013-12-16 MED ORDER — DIPHENHYDRAMINE HCL 25 MG PO CAPS
25.0000 mg | ORAL_CAPSULE | Freq: Once | ORAL | Status: AC
  Administered 2013-12-16: 25 mg via ORAL

## 2013-12-16 MED ORDER — FUROSEMIDE 10 MG/ML IJ SOLN
20.0000 mg | Freq: Once | INTRAMUSCULAR | Status: AC
  Administered 2013-12-16: 20 mg via INTRAVENOUS

## 2013-12-16 MED ORDER — HEPARIN SOD (PORK) LOCK FLUSH 100 UNIT/ML IV SOLN
500.0000 [IU] | Freq: Every day | INTRAVENOUS | Status: AC | PRN
  Administered 2013-12-16: 500 [IU]
  Filled 2013-12-16: qty 5

## 2013-12-16 MED ORDER — SODIUM CHLORIDE 0.9 % IJ SOLN
10.0000 mL | INTRAMUSCULAR | Status: AC | PRN
  Administered 2013-12-16: 10 mL
  Filled 2013-12-16: qty 10

## 2013-12-16 MED ORDER — ACETAMINOPHEN 325 MG PO TABS
650.0000 mg | ORAL_TABLET | Freq: Once | ORAL | Status: AC
  Administered 2013-12-16: 650 mg via ORAL

## 2013-12-16 MED ORDER — SODIUM CHLORIDE 0.9 % IV SOLN
250.0000 mL | Freq: Once | INTRAVENOUS | Status: AC
  Administered 2013-12-16: 250 mL via INTRAVENOUS

## 2013-12-16 NOTE — Patient Instructions (Signed)
Blood Transfusion Information WHAT IS A BLOOD TRANSFUSION? A transfusion is the replacement of blood or some of its parts. Blood is made up of multiple cells which provide different functions.  Red blood cells carry oxygen and are used for blood loss replacement.  White blood cells fight against infection.  Platelets control bleeding.  Plasma helps clot blood.  Other blood products are available for specialized needs, such as hemophilia or other clotting disorders. BEFORE THE TRANSFUSION  Who gives blood for transfusions?   You may be able to donate blood to be used at a later date on yourself (autologous donation).  Relatives can be asked to donate blood. This is generally not any safer than if you have received blood from a stranger. The same precautions are taken to ensure safety when a relative's blood is donated.  Healthy volunteers who are fully evaluated to make sure their blood is safe. This is blood bank blood. Transfusion therapy is the safest it has ever been in the practice of medicine. Before blood is taken from a donor, a complete history is taken to make sure that person has no history of diseases nor engages in risky social behavior (examples are intravenous drug use or sexual activity with multiple partners). The donor's travel history is screened to minimize risk of transmitting infections, such as malaria. The donated blood is tested for signs of infectious diseases, such as HIV and hepatitis. The blood is then tested to be sure it is compatible with you in order to minimize the chance of a transfusion reaction. If you or a relative donates blood, this is often done in anticipation of surgery and is not appropriate for emergency situations. It takes many days to process the donated blood. RISKS AND COMPLICATIONS Although transfusion therapy is very safe and saves many lives, the main dangers of transfusion include:   Getting an infectious disease.  Developing a  transfusion reaction. This is an allergic reaction to something in the blood you were given. Every precaution is taken to prevent this. The decision to have a blood transfusion has been considered carefully by your caregiver before blood is given. Blood is not given unless the benefits outweigh the risks. AFTER THE TRANSFUSION  Right after receiving a blood transfusion, you will usually feel much better and more energetic. This is especially true if your red blood cells have gotten low (anemic). The transfusion raises the level of the red blood cells which carry oxygen, and this usually causes an energy increase.  The nurse administering the transfusion will monitor you carefully for complications. HOME CARE INSTRUCTIONS  No special instructions are needed after a transfusion. You may find your energy is better. Speak with your caregiver about any limitations on activity for underlying diseases you may have. SEEK MEDICAL CARE IF:   Your condition is not improving after your transfusion.  You develop redness or irritation at the intravenous (IV) site. SEEK IMMEDIATE MEDICAL CARE IF:  Any of the following symptoms occur over the next 12 hours:  Shaking chills.  You have a temperature by mouth above 102 F (38.9 C), not controlled by medicine.  Chest, back, or muscle pain.  People around you feel you are not acting correctly or are confused.  Shortness of breath or difficulty breathing.  Dizziness and fainting.  You get a rash or develop hives.  You have a decrease in urine output.  Your urine turns a dark color or changes to pink, red, or Eble. Any of the following   symptoms occur over the next 10 days:  You have a temperature by mouth above 102 F (38.9 C), not controlled by medicine.  Shortness of breath.  Weakness after normal activity.  The white part of the eye turns yellow (jaundice).  You have a decrease in the amount of urine or are urinating less often.  Your  urine turns a dark color or changes to pink, red, or Rase. Document Released: 07/31/2000 Document Revised: 10/26/2011 Document Reviewed: 03/19/2008 ExitCare Patient Information 2014 [employer].  

## 2013-12-17 LAB — TYPE AND SCREEN
ABO/RH(D): O POS
Antibody Screen: POSITIVE
DAT, IgG: NEGATIVE
Unit division: 0
Unit division: 0

## 2013-12-18 ENCOUNTER — Encounter: Payer: Self-pay | Admitting: Hematology & Oncology

## 2013-12-20 ENCOUNTER — Ambulatory Visit (HOSPITAL_BASED_OUTPATIENT_CLINIC_OR_DEPARTMENT_OTHER): Payer: Medicare HMO

## 2013-12-20 ENCOUNTER — Ambulatory Visit (HOSPITAL_BASED_OUTPATIENT_CLINIC_OR_DEPARTMENT_OTHER)
Admission: RE | Admit: 2013-12-20 | Discharge: 2013-12-20 | Disposition: A | Discharge status: Home / Self Care | Payer: Medicare HMO | Source: Ambulatory Visit | Attending: Hematology & Oncology | Admitting: Hematology & Oncology

## 2013-12-20 ENCOUNTER — Ambulatory Visit (HOSPITAL_BASED_OUTPATIENT_CLINIC_OR_DEPARTMENT_OTHER): Payer: Medicare HMO | Admitting: Lab

## 2013-12-20 VITALS — BP 123/77 | HR 99 | Temp 97.8°F | Resp 20

## 2013-12-20 DIAGNOSIS — R197 Diarrhea, unspecified: Secondary | ICD-10-CM

## 2013-12-20 DIAGNOSIS — R05 Cough: Secondary | ICD-10-CM | POA: Insufficient documentation

## 2013-12-20 DIAGNOSIS — C9000 Multiple myeloma not having achieved remission: Secondary | ICD-10-CM | POA: Insufficient documentation

## 2013-12-20 DIAGNOSIS — C9002 Multiple myeloma in relapse: Secondary | ICD-10-CM

## 2013-12-20 DIAGNOSIS — R112 Nausea with vomiting, unspecified: Secondary | ICD-10-CM

## 2013-12-20 DIAGNOSIS — R059 Cough, unspecified: Secondary | ICD-10-CM | POA: Insufficient documentation

## 2013-12-20 LAB — CBC WITH DIFFERENTIAL (CANCER CENTER ONLY)
BASO#: 0 10*3/uL (ref 0.0–0.2)
BASO%: 0.3 % (ref 0.0–2.0)
EOS%: 1.1 % (ref 0.0–7.0)
Eosinophils Absolute: 0 10*3/uL (ref 0.0–0.5)
HEMATOCRIT: 30.6 % — AB (ref 38.7–49.9)
HGB: 10 g/dL — ABNORMAL LOW (ref 13.0–17.1)
LYMPH#: 0.6 10*3/uL — ABNORMAL LOW (ref 0.9–3.3)
LYMPH%: 17.2 % (ref 14.0–48.0)
MCH: 28.4 pg (ref 28.0–33.4)
MCHC: 32.7 g/dL (ref 32.0–35.9)
MCV: 87 fL (ref 82–98)
MONO#: 0.6 10*3/uL (ref 0.1–0.9)
MONO%: 15.3 % — ABNORMAL HIGH (ref 0.0–13.0)
NEUT#: 2.5 10*3/uL (ref 1.5–6.5)
NEUT%: 66.1 % (ref 40.0–80.0)
Platelets: 158 10*3/uL (ref 145–400)
RBC: 3.52 10*6/uL — ABNORMAL LOW (ref 4.20–5.70)
RDW: 18.7 % — ABNORMAL HIGH (ref 11.1–15.7)
WBC: 3.7 10*3/uL — ABNORMAL LOW (ref 4.0–10.0)

## 2013-12-20 LAB — CMP (CANCER CENTER ONLY)
ALT(SGPT): 14 U/L (ref 10–47)
AST: 21 U/L (ref 11–38)
Albumin: 3.2 g/dL — ABNORMAL LOW (ref 3.3–5.5)
Alkaline Phosphatase: 52 U/L (ref 26–84)
BILIRUBIN TOTAL: 0.7 mg/dL (ref 0.20–1.60)
BUN: 14 mg/dL (ref 7–22)
CO2: 25 meq/L (ref 18–33)
CREATININE: 1.1 mg/dL (ref 0.6–1.2)
Calcium: 8.1 mg/dL (ref 8.0–10.3)
Chloride: 109 mEq/L — ABNORMAL HIGH (ref 98–108)
GLUCOSE: 126 mg/dL — AB (ref 73–118)
Potassium: 3.1 mEq/L — ABNORMAL LOW (ref 3.3–4.7)
Sodium: 139 mEq/L (ref 128–145)
Total Protein: 5.7 g/dL — ABNORMAL LOW (ref 6.4–8.1)

## 2013-12-20 MED ORDER — SODIUM CHLORIDE 0.9 % IV SOLN
Freq: Once | INTRAVENOUS | Status: AC
  Administered 2013-12-20: 13:00:00 via INTRAVENOUS

## 2013-12-20 MED ORDER — SODIUM CHLORIDE 0.9 % IJ SOLN
10.0000 mL | INTRAMUSCULAR | Status: DC | PRN
  Administered 2013-12-20: 10 mL via INTRAVENOUS
  Filled 2013-12-20: qty 10

## 2013-12-20 MED ORDER — HEPARIN SOD (PORK) LOCK FLUSH 100 UNIT/ML IV SOLN
500.0000 [IU] | Freq: Once | INTRAVENOUS | Status: AC
  Administered 2013-12-20: 500 [IU] via INTRAVENOUS
  Filled 2013-12-20: qty 5

## 2013-12-20 NOTE — Progress Notes (Signed)
Hematology and Oncology Follow Up Visit  Arthur Valdez [id number] 12-05-1932 80 y.o. 12/20/2013   Principle Diagnosis:   Recurrent lambda light chain myeloma  Current Therapy:   Carfilzomib -status post 2 cycles   Interim History:  Mr.  Bunn is in for an internal visit. He has recurrent light chain myeloma. This is lambda light chain myeloma. He has been down to [facility] for protocol therapy. He did well and then began to progress. He had more bone lesions.  He is being considered for another clinical trial. However, this likely will not open for another month or so. He is debating whether not even do a clinical trial.  He is being treated with Carfilzomib. So far, is tolerating it pretty well. His lambda light chain went from 318 down to 280.  He was transfused 2 units of blood over the weekend. His hemoglobin was 8. Raplon Senokot again having nausea vomiting and diarrhea. There is no fever. Had a little cough. There is no shortness of breath. He had no bleeding. No rashes. He was not ready but he was honestly sick.   He came to the office. We did a chest x-ray on him. There is no pneumonia.  We will ahead and give IV fluids. Breath lab work was done. All this looks fairly good. His hemoglobin was 10. He a good renal function. He says he's feeling better now. Does have diabetes. Is on insulin. His blood sugars were 128 today.  Medications: Current outpatient prescriptions:acyclovir (ZOVIRAX) 400 MG tablet, Take 1 tablet (400 mg total) by mouth daily., Disp: 30 tablet, Rfl: 3;  albuterol (PROVENTIL HFA) 108 (90 BASE) MCG/ACT inhaler, Inhale 2 puffs into the lungs every 4 (four) hours as needed., Disp: 2 Inhaler, Rfl: 3;  B Complex-C (B-COMPLEX WITH VITAMIN C) tablet, Take 1 tablet by mouth daily., Disp: , Rfl:  clotrimazole (MYCELEX) 10 MG troche, Take 1 tablet (10 mg total) by mouth 3 (three) times daily as needed. Mouth sore, Disp: 90 tablet, Rfl: 0;  dexamethasone (DECADRON) 4 MG  tablet, Take 2 tablets (8 mg total) by mouth 2 (two) times daily with a meal. Start the day after chemotherapy for 2 days. Take with food., Disp: 30 tablet, Rfl: 1 HYDROmorphone (DILAUDID) 2 MG tablet, Take 1 tablet (2 mg total) by mouth every 6 (six) hours as needed for moderate pain or severe pain., Disp: 90 tablet, Rfl: 0;  insulin glargine (LANTUS) 100 UNIT/ML injection, Inject 5 Units into the skin at bedtime. Pt only take if bs > 125, Disp: , Rfl: ;  insulin glargine (LANTUS) 100 UNIT/ML injection, Inject 5 Units into the skin daily as needed. Just take it when sugar is up per patient, Disp: , Rfl:  levofloxacin (LEVAQUIN) 750 MG tablet, Take 1 tablet (750 mg total) by mouth every other day., Disp: 5 tablet, Rfl: 0;  levothyroxine (SYNTHROID, LEVOTHROID) 50 MCG tablet, Take 50 mcg by mouth daily before breakfast., Disp: , Rfl: ;  loperamide (IMODIUM) 2 MG capsule, Take 1 capsule (2 mg total) by mouth as needed for diarrhea or loose stools., Disp: 30 capsule, Rfl: 0 LORazepam (ATIVAN) 0.5 MG tablet, Take 1 tablet (0.5 mg total) by mouth every 6 (six) hours as needed (Nausea or vomiting)., Disp: 30 tablet, Rfl: 0;  LORazepam (ATIVAN) 2 MG tablet, Take 1 tablet (2 mg total) by mouth every 8 (eight) hours as needed for anxiety., Disp: 90 tablet, Rfl: 1;  omeprazole (PRILOSEC) 20 MG capsule, Take 20 mg by  mouth 2 (two) times daily. , Disp: , Rfl:  ondansetron (ZOFRAN) 8 MG tablet, Take 1 tablet (8 mg total) by mouth 2 (two) times daily. Take 1 tablet by mouth (8 mg ) in the am and one tablet by mouth in the pm beginning day 3 or Wednesday, 05/18/12 for 3 days total., Disp: 20 tablet, Rfl: 3;  ondansetron (ZOFRAN) 8 MG tablet, Take 1 tablet (8 mg total) by mouth 2 (two) times daily. Start the day after chemo for 2 days. Then take as needed for nausea or vomiting., Disp: 30 tablet, Rfl: 1 oxyCODONE (ROXICODONE) 5 MG immediate release tablet, Take 1 tablet (5 mg total) by mouth every 4 (four) hours as needed for  pain., Disp: 40 tablet, Rfl: 0;  prochlorperazine (COMPAZINE) 10 MG tablet, Take 1 tablet (10 mg total) by mouth every 6 (six) hours as needed (Nausea or vomiting)., Disp: 30 tablet, Rfl: 1 promethazine (PHENERGAN) 25 MG tablet, Take 0.5 tablets (12.5 mg total) by mouth every 6 (six) hours as needed for nausea., Disp: 30 tablet, Rfl: 0;  ranitidine (ZANTAC) 150 MG capsule, Take 150 mg by mouth at bedtime., Disp: , Rfl: ;  sitaGLIPtin (JANUVIA) 100 MG tablet, Take 1 tablet (100 mg total) by mouth daily before breakfast., Disp: 30 tablet, Rfl: 0 traMADol (ULTRAM) 50 MG tablet, TAKE 1 TO 2 TABLETS EVERY 6 TO 8 HOURS AS NEEDED FOR PAIN, Disp: 120 tablet, Rfl: 0 Current facility-administered medications:sodium chloride 0.9 % injection 10 mL, 10 mL, Intravenous, PRN, Volanda Napoleon, MD, 10 mL at 12/20/13 1604 Facility-Administered Medications Ordered in Other Visits: 0.9 %  sodium chloride infusion, , Intravenous, Once, Volanda Napoleon, MD;  sodium chloride 0.9 % injection 10 mL, 10 mL, Intravenous, PRN, Volanda Napoleon, MD, 10 mL at 07/16/11 1002;  sodium chloride 0.9 % injection 10 mL, 10 mL, Intracatheter, PRN, Volanda Napoleon, MD, 10 mL at 12/01/13 1409  Allergies:  Allergies  Allergen Reactions  . Raspberry     hives    Past Medical History, Surgical history, Social history, and Family History were reviewed and updated.  Review of Systems: As above  Physical Exam:  oral temperature is 97.8 F (36.6 C). His blood pressure is 123/77 and his pulse is 99. His respiration is 20.   1 about and well nourished African American gentleman. Lungs are clear. No rales are noted. Oral exam slightly dry. No mucositis. Cardiac exam regular in rhythm. No murmurs rubs or bruits. Abdomen soft. His bowel sounds are slightly decreased. There is no fluid wave. There is a palpable liver or spleen tip. Back exam no tenderness over the spine. Extremities shows no clubbing cyanosis or edema. He has decreased strength in  his joints. He has good range of motion of his joints. Skin exam side dry. No rashes. No ecchymosis. Neurological exam is nonfocal.  Lab Results  Component Value Date   WBC 3.7* 12/20/2013   HGB 10.0* 12/20/2013   HCT 30.6* 12/20/2013   MCV 87 12/20/2013   PLT 158 12/20/2013     Chemistry      Component Value Date/Time   NA 139 12/20/2013 1427   NA 140 12/14/2013 1302   K 3.1* 12/20/2013 1427   K 3.6 12/14/2013 1302   CL 109* 12/20/2013 1427   CL 109 12/14/2013 1302   CO2 25 12/20/2013 1427   CO2 22 12/14/2013 1302   BUN 14 12/20/2013 1427   BUN 17 12/14/2013 1302   CREATININE 1.1 12/20/2013 1427  CREATININE 1.22 12/14/2013 1302      Component Value Date/Time   CALCIUM 8.1 12/20/2013 1427   CALCIUM 8.0* 12/14/2013 1302   ALKPHOS 52 12/20/2013 1427   ALKPHOS 53 12/14/2013 1302   AST 21 12/20/2013 1427   AST 12 12/14/2013 1302   ALT 14 12/20/2013 1427   ALT <8 12/14/2013 1302   BILITOT 0.70 12/20/2013 1427   BILITOT 0.5 12/14/2013 1302         Impression and Plan: Mr. Steinborn is 80 year old African American,. He has recurrent lambda light chain myeloma. He is on Carfilzomib. Again I think is tolerating this well. He is responding from my point of view.  We will just continue the IV fluids. I don't think he needs any antibiotics.  I much or when he restarts the chemotherapy. We may want to give him next week for a recovery period  We will keep his appointments as scheduled   Volanda Napoleon, MD 5/6/20155:23 PM

## 2013-12-20 NOTE — Patient Instructions (Signed)
Dehydration, Adult Dehydration is when you lose more fluids from the body than you take in. Vital organs like the kidneys, brain, and heart cannot function without a proper amount of fluids and salt. Any loss of fluids from the body can cause dehydration.  CAUSES   Vomiting.  Diarrhea.  Excessive sweating.  Excessive urine output.  Fever. SYMPTOMS  Mild dehydration  Thirst.  Dry lips.  Slightly dry mouth. Moderate dehydration  Very dry mouth.  Sunken eyes.  Skin does not bounce back quickly when lightly pinched and released.  Dark urine and decreased urine production.  Decreased tear production.  Headache. Severe dehydration  Very dry mouth.  Extreme thirst.  Rapid, weak pulse (more than 100 beats per minute at rest).  Cold hands and feet.  Not able to sweat in spite of heat and temperature.  Rapid breathing.  Blue lips.  Confusion and lethargy.  Difficulty being awakened.  Minimal urine production.  No tears. DIAGNOSIS  Your caregiver will diagnose dehydration based on your symptoms and your exam. Blood and urine tests will help confirm the diagnosis. The diagnostic evaluation should also identify the cause of dehydration. TREATMENT  Treatment of mild or moderate dehydration can often be done at home by increasing the amount of fluids that you drink. It is best to drink small amounts of fluid more often. Drinking too much at one time can make vomiting worse. Refer to the home care instructions below. Severe dehydration needs to be treated at the hospital where you will probably be given intravenous (IV) fluids that contain water and electrolytes. HOME CARE INSTRUCTIONS   Ask your caregiver about specific rehydration instructions.  Drink enough fluids to keep your urine clear or pale yellow.  Drink small amounts frequently if you have nausea and vomiting.  Eat as you normally do.  Avoid:  Foods or drinks high in sugar.  Carbonated  drinks.  Juice.  Extremely hot or cold fluids.  Drinks with caffeine.  Fatty, greasy foods.  Alcohol.  Tobacco.  Overeating.  Gelatin desserts.  Wash your hands well to avoid spreading bacteria and viruses.  Only take over-the-counter or prescription medicines for pain, discomfort, or fever as directed by your caregiver.  Ask your caregiver if you should continue all prescribed and over-the-counter medicines.  Keep all follow-up appointments with your caregiver. SEEK MEDICAL CARE IF:  You have abdominal pain and it increases or stays in one area (localizes).  You have a rash, stiff neck, or severe headache.  You are irritable, sleepy, or difficult to awaken.  You are weak, dizzy, or extremely thirsty. SEEK IMMEDIATE MEDICAL CARE IF:   You are unable to keep fluids down or you get worse despite treatment.  You have frequent episodes of vomiting or diarrhea.  You have blood or green matter (bile) in your vomit.  You have blood in your stool or your stool looks black and tarry.  You have not urinated in 6 to 8 hours, or you have only urinated a small amount of very dark urine.  You have a fever.  You faint. MAKE SURE YOU:   Understand these instructions.  Will watch your condition.  Will get help right away if you are not doing well or get worse. Document Released: 08/03/2005 Document Revised: 10/26/2011 Document Reviewed: 03/23/2011 Exit[employer] Patient Information 2014 [employer].  

## 2013-12-27 ENCOUNTER — Other Ambulatory Visit: Payer: Self-pay | Admitting: *Deleted

## 2013-12-27 DIAGNOSIS — C9002 Multiple myeloma in relapse: Secondary | ICD-10-CM

## 2013-12-28 ENCOUNTER — Ambulatory Visit (HOSPITAL_BASED_OUTPATIENT_CLINIC_OR_DEPARTMENT_OTHER): Payer: Medicare HMO

## 2013-12-28 ENCOUNTER — Other Ambulatory Visit (HOSPITAL_BASED_OUTPATIENT_CLINIC_OR_DEPARTMENT_OTHER): Payer: Medicare HMO | Admitting: Lab

## 2013-12-28 VITALS — BP 133/79 | HR 85 | Temp 97.0°F | Resp 20

## 2013-12-28 DIAGNOSIS — Z5112 Encounter for antineoplastic immunotherapy: Secondary | ICD-10-CM

## 2013-12-28 DIAGNOSIS — C9002 Multiple myeloma in relapse: Secondary | ICD-10-CM

## 2013-12-28 LAB — CBC WITH DIFFERENTIAL (CANCER CENTER ONLY)
BASO#: 0 10*3/uL (ref 0.0–0.2)
BASO%: 0.2 % (ref 0.0–2.0)
EOS ABS: 0.1 10*3/uL (ref 0.0–0.5)
EOS%: 1.9 % (ref 0.0–7.0)
HCT: 30.7 % — ABNORMAL LOW (ref 38.7–49.9)
HEMOGLOBIN: 9.8 g/dL — AB (ref 13.0–17.1)
LYMPH#: 0.8 10*3/uL — AB (ref 0.9–3.3)
LYMPH%: 15.6 % (ref 14.0–48.0)
MCH: 28.6 pg (ref 28.0–33.4)
MCHC: 31.9 g/dL — ABNORMAL LOW (ref 32.0–35.9)
MCV: 90 fL (ref 82–98)
MONO#: 0.7 10*3/uL (ref 0.1–0.9)
MONO%: 13.7 % — ABNORMAL HIGH (ref 0.0–13.0)
NEUT%: 68.6 % (ref 40.0–80.0)
NEUTROS ABS: 3.6 10*3/uL (ref 1.5–6.5)
Platelets: 208 10*3/uL (ref 145–400)
RBC: 3.43 10*6/uL — ABNORMAL LOW (ref 4.20–5.70)
RDW: 18.7 % — ABNORMAL HIGH (ref 11.1–15.7)
WBC: 5.2 10*3/uL (ref 4.0–10.0)

## 2013-12-28 LAB — CMP (CANCER CENTER ONLY)
ALBUMIN: 2.9 g/dL — AB (ref 3.3–5.5)
ALT: 13 U/L (ref 10–47)
AST: 16 U/L (ref 11–38)
Alkaline Phosphatase: 45 U/L (ref 26–84)
BUN, Bld: 14 mg/dL (ref 7–22)
CO2: 30 meq/L (ref 18–33)
Calcium: 8.3 mg/dL (ref 8.0–10.3)
Chloride: 105 mEq/L (ref 98–108)
Creat: 1.3 mg/dl — ABNORMAL HIGH (ref 0.6–1.2)
Glucose, Bld: 157 mg/dL — ABNORMAL HIGH (ref 73–118)
POTASSIUM: 3.5 meq/L (ref 3.3–4.7)
SODIUM: 143 meq/L (ref 128–145)
TOTAL PROTEIN: 5.8 g/dL — AB (ref 6.4–8.1)
Total Bilirubin: 0.7 mg/dl (ref 0.20–1.60)

## 2013-12-28 LAB — HOLD TUBE, BLOOD BANK - CHCC SATELLITE

## 2013-12-28 LAB — TECHNOLOGIST REVIEW CHCC SATELLITE

## 2013-12-28 MED ORDER — HEPARIN SOD (PORK) LOCK FLUSH 100 UNIT/ML IV SOLN
500.0000 [IU] | Freq: Once | INTRAVENOUS | Status: AC | PRN
  Administered 2013-12-28: 500 [IU]
  Filled 2013-12-28: qty 5

## 2013-12-28 MED ORDER — SODIUM CHLORIDE 0.9 % IV SOLN
Freq: Once | INTRAVENOUS | Status: AC
  Administered 2013-12-28: 15:00:00 via INTRAVENOUS

## 2013-12-28 MED ORDER — ONDANSETRON 8 MG/50ML IVPB (CHCC)
8.0000 mg | Freq: Once | INTRAVENOUS | Status: AC
  Administered 2013-12-28: 8 mg via INTRAVENOUS

## 2013-12-28 MED ORDER — DEXAMETHASONE SODIUM PHOSPHATE 10 MG/ML IJ SOLN
10.0000 mg | Freq: Once | INTRAMUSCULAR | Status: AC
  Administered 2013-12-28: 10 mg via INTRAVENOUS

## 2013-12-28 MED ORDER — DEXTROSE 5 % IV SOLN
56.0000 mg/m2 | Freq: Once | INTRAVENOUS | Status: AC
  Administered 2013-12-28: 120 mg via INTRAVENOUS
  Filled 2013-12-28: qty 60

## 2013-12-28 MED ORDER — DEXAMETHASONE SODIUM PHOSPHATE 10 MG/ML IJ SOLN
INTRAMUSCULAR | Status: AC
  Filled 2013-12-28: qty 1

## 2013-12-28 MED ORDER — SODIUM CHLORIDE 0.9 % IJ SOLN
10.0000 mL | INTRAMUSCULAR | Status: DC | PRN
  Administered 2013-12-28: 10 mL
  Filled 2013-12-28: qty 10

## 2013-12-28 NOTE — Patient Instructions (Signed)
Carfilzomib injection What is this medicine? CARFILZOMIB (kar FILZ oh mib) is a chemotherapy drug that works by slowing or stopping cancer cell growth. This medicine is used to treat multiple myeloma. This medicine may be used for other purposes; ask your health care provider or pharmacist if you have questions. COMMON BRAND NAME(S): KYPROLIS What should I tell my health care provider before I take this medicine? They need to know if you have any of these conditions: -heart disease -irregular heartbeat -liver disease -lung or breathing disease -an unusual or allergic reaction to carfilzomib, or other medicines, foods, dyes, or preservatives -pregnant or trying to get pregnant -breast-feeding How should I use this medicine? This medicine is for injection or infusion into a vein. It is given by a health care professional in a hospital or clinic setting. Talk to your pediatrician regarding the use of this medicine in children. Special care may be needed. Overdosage: If you think you've taken too much of this medicine contact a poison control center or emergency room at once. Overdosage: If you think you have taken too much of this medicine contact a poison control center or emergency room at once. NOTE: This medicine is only for you. Do not share this medicine with others. What if I miss a dose? It is important not to miss your dose. Call your doctor or health care professional if you are unable to keep an appointment. What may interact with this medicine? Interactions are not expected. Give your health care provider a list of all the medicines, herbs, non-prescription drugs, or dietary supplements you use. Also tell them if you smoke, drink alcohol, or use illegal drugs. Some items may interact with your medicine. This list may not describe all possible interactions. Give your health care provider a list of all the medicines, herbs, non-prescription drugs, or dietary supplements you use. Also  tell them if you smoke, drink alcohol, or use illegal drugs. Some items may interact with your medicine. What should I watch for while using this medicine? Your condition will be monitored carefully while you are receiving this medicine. Report any side effects. Continue your course of treatment even though you feel ill unless your doctor tells you to stop. Call your doctor or health care professional for advice if you get a fever, chills or sore throat, or other symptoms of a cold or flu. Do not treat yourself. Try to avoid being around people who are sick. Do not become pregnant while taking this medicine. Women should inform their doctor if they wish to become pregnant or think they might be pregnant. There is a potential for serious side effects to an unborn child. Talk to your health care professional or pharmacist for more information. Do not breast-feed an infant while taking this medicine. Check with your doctor or health care professional if you get an attack of severe diarrhea, nausea and vomiting, or if you sweat a lot. The loss of too much body fluid can make it dangerous for you to take this medicine. You may get dizzy. Do not drive, use machinery, or do anything that needs mental alertness until you know how this medicine affects you. Do not stand or sit up quickly, especially if you are an older patient. This reduces the risk of dizzy or fainting spells. What side effects may I notice from receiving this medicine? Side effects that you should report to your doctor or health care professional as soon as possible: -allergic reactions like skin rash, itching or hives,  swelling of the face, lips, or tongue -breathing problems -chest pain or palpitationschest tightness -cough -dark urine -dizziness -feeling faint or lightheaded -fever or chills -general ill feeling or flu-like symptoms -light-colored stools -palpitations -right upper belly pain -swelling of the legs or ankles -unusual  bleeding or bruising -unusually weak or tired -yellowing of the eyes or skin  Side effects that usually do not require medical attention (Report these to your doctor or health care professional if they continue or are bothersome.): -diarrhea -headache -nausea, vomiting -tiredness This list may not describe all possible side effects. Call your doctor for medical advice about side effects. You may report side effects to FDA at 1-800-FDA-1088. Where should I keep my medicine? This drug is given in a hospital or clinic and will not be stored at home. NOTE: This sheet is a summary. It may not cover all possible information. If you have questions about this medicine, talk to your doctor, pharmacist, or health care provider.  2014, Elsevier/Gold Standard. (2012-01-22 17:02:29)  

## 2013-12-29 ENCOUNTER — Ambulatory Visit (HOSPITAL_BASED_OUTPATIENT_CLINIC_OR_DEPARTMENT_OTHER): Payer: Medicare HMO

## 2013-12-29 VITALS — BP 134/74 | HR 94 | Temp 97.0°F | Resp 18

## 2013-12-29 DIAGNOSIS — C9002 Multiple myeloma in relapse: Secondary | ICD-10-CM

## 2013-12-29 DIAGNOSIS — Z5112 Encounter for antineoplastic immunotherapy: Secondary | ICD-10-CM

## 2013-12-29 MED ORDER — SODIUM CHLORIDE 0.9 % IJ SOLN
3.0000 mL | INTRAMUSCULAR | Status: DC | PRN
  Filled 2013-12-29: qty 10

## 2013-12-29 MED ORDER — FAMOTIDINE IN NACL 20-0.9 MG/50ML-% IV SOLN: 20.0000 mg | Freq: Once | INTRAVENOUS | Status: DC | PRN

## 2013-12-29 MED ORDER — ALTEPLASE 2 MG IJ SOLR
2.0000 mg | Freq: Once | INTRAMUSCULAR | Status: DC | PRN
  Filled 2013-12-29: qty 2

## 2013-12-29 MED ORDER — EPINEPHRINE HCL 1 MG/ML IJ SOLN: 0.5000 mg | Freq: Once | INTRAMUSCULAR | Status: DC | PRN

## 2013-12-29 MED ORDER — SODIUM CHLORIDE 0.9 % IJ SOLN
10.0000 mL | INTRAMUSCULAR | Status: DC | PRN
  Administered 2013-12-29: 10 mL
  Filled 2013-12-29: qty 10

## 2013-12-29 MED ORDER — HEPARIN SOD (PORK) LOCK FLUSH 100 UNIT/ML IV SOLN
500.0000 [IU] | Freq: Once | INTRAVENOUS | Status: AC | PRN
  Administered 2013-12-29: 500 [IU]
  Filled 2013-12-29: qty 5

## 2013-12-29 MED ORDER — SODIUM CHLORIDE 0.9 % IV SOLN: Freq: Once | INTRAVENOUS | Status: DC | PRN

## 2013-12-29 MED ORDER — HEPARIN SOD (PORK) LOCK FLUSH 100 UNIT/ML IV SOLN
250.0000 [IU] | Freq: Once | INTRAVENOUS | Status: DC | PRN
  Filled 2013-12-29: qty 5

## 2013-12-29 MED ORDER — SODIUM CHLORIDE 0.9 % IV SOLN
Freq: Once | INTRAVENOUS | Status: AC
  Administered 2013-12-29: 13:00:00 via INTRAVENOUS

## 2013-12-29 MED ORDER — DEXAMETHASONE SODIUM PHOSPHATE 10 MG/ML IJ SOLN
10.0000 mg | Freq: Once | INTRAMUSCULAR | Status: AC
  Administered 2013-12-29: 10 mg via INTRAVENOUS

## 2013-12-29 MED ORDER — METHYLPREDNISOLONE SODIUM SUCC 125 MG IJ SOLR: 125.0000 mg | Freq: Once | INTRAMUSCULAR | Status: DC | PRN

## 2013-12-29 MED ORDER — EPINEPHRINE HCL 0.1 MG/ML IJ SOSY
0.2500 mg | PREFILLED_SYRINGE | Freq: Once | INTRAMUSCULAR | Status: DC | PRN
  Filled 2013-12-29: qty 10

## 2013-12-29 MED ORDER — ALBUTEROL SULFATE (2.5 MG/3ML) 0.083% IN NEBU
2.5000 mg | INHALATION_SOLUTION | Freq: Once | RESPIRATORY_TRACT | Status: DC | PRN
  Filled 2013-12-29: qty 3

## 2013-12-29 MED ORDER — HEPARIN SOD (PORK) LOCK FLUSH 100 UNIT/ML IV SOLN
500.0000 [IU] | Freq: Once | INTRAVENOUS | Status: DC | PRN
Start: 2013-12-29 — End: 2013-12-29
  Filled 2013-12-29: qty 5

## 2013-12-29 MED ORDER — DIPHENHYDRAMINE HCL 50 MG/ML IJ SOLN: 25.0000 mg | Freq: Once | INTRAMUSCULAR | Status: DC | PRN

## 2013-12-29 MED ORDER — SODIUM CHLORIDE 0.9 % IV SOLN: Freq: Once | INTRAVENOUS | Status: DC

## 2013-12-29 MED ORDER — SODIUM CHLORIDE 0.9 % IJ SOLN
10.0000 mL | INTRAMUSCULAR | Status: DC | PRN
  Filled 2013-12-29: qty 10

## 2013-12-29 MED ORDER — ONDANSETRON 8 MG/50ML IVPB (CHCC)
8.0000 mg | Freq: Once | INTRAVENOUS | Status: AC
  Administered 2013-12-29: 8 mg via INTRAVENOUS

## 2013-12-29 MED ORDER — CARFILZOMIB CHEMO INJECTION 60 MG
56.0000 mg/m2 | Freq: Once | INTRAVENOUS | Status: AC
  Administered 2013-12-29: 120 mg via INTRAVENOUS
  Filled 2013-12-29: qty 60

## 2013-12-29 MED ORDER — ZOLEDRONIC ACID 4 MG/5ML IV CONC
3.5000 mg | Freq: Once | INTRAVENOUS | Status: AC
  Administered 2013-12-29: 3.5 mg via INTRAVENOUS
  Filled 2013-12-29: qty 4.38

## 2013-12-29 MED ORDER — DEXAMETHASONE SODIUM PHOSPHATE 10 MG/ML IJ SOLN
INTRAMUSCULAR | Status: AC
  Filled 2013-12-29: qty 1

## 2013-12-29 MED ORDER — DIPHENHYDRAMINE HCL 50 MG/ML IJ SOLN: 50.0000 mg | Freq: Once | INTRAMUSCULAR | Status: DC | PRN

## 2013-12-29 NOTE — Patient Instructions (Signed)
Zoledronic Acid injection (Hypercalcemia, Oncology) What is this medicine? ZOLEDRONIC ACID (ZOE le dron ik AS id) lowers the amount of calcium loss from bone. It is used to treat too much calcium in your blood from cancer. It is also used to prevent complications of cancer that has spread to the bone. This medicine may be used for other purposes; ask your health care provider or pharmacist if you have questions. COMMON BRAND NAME(S): Zometa What should I tell my health care provider before I take this medicine? They need to know if you have any of these conditions: -aspirin-sensitive asthma -cancer, especially if you are receiving medicines used to treat cancer -dental disease or wear dentures -infection -kidney disease -receiving corticosteroids like dexamethasone or prednisone -an unusual or allergic reaction to zoledronic acid, other medicines, foods, dyes, or preservatives -pregnant or trying to get pregnant -breast-feeding How should I use this medicine? This medicine is for infusion into a vein. It is given by a health care professional in a hospital or clinic setting. Talk to your pediatrician regarding the use of this medicine in children. Special care may be needed. Overdosage: If you think you have taken too much of this medicine contact a poison control center or emergency room at once. NOTE: This medicine is only for you. Do not share this medicine with others. What if I miss a dose? It is important not to miss your dose. Call your doctor or health care professional if you are unable to keep an appointment. What may interact with this medicine? -certain antibiotics given by injection -NSAIDs, medicines for pain and inflammation, like ibuprofen or naproxen -some diuretics like bumetanide, furosemide -teriparatide -thalidomide This list may not describe all possible interactions. Give your health care provider a list of all the medicines, herbs, non-prescription drugs, or  dietary supplements you use. Also tell them if you smoke, drink alcohol, or use illegal drugs. Some items may interact with your medicine. What should I watch for while using this medicine? Visit your doctor or health care professional for regular checkups. It may be some time before you see the benefit from this medicine. Do not stop taking your medicine unless your doctor tells you to. Your doctor may order blood tests or other tests to see how you are doing. Women should inform their doctor if they wish to become pregnant or think they might be pregnant. There is a potential for serious side effects to an unborn child. Talk to your health care professional or pharmacist for more information. You should make sure that you get enough calcium and vitamin D while you are taking this medicine. Discuss the foods you eat and the vitamins you take with your health care professional. Some people who take this medicine have severe bone, joint, and/or muscle pain. This medicine may also increase your risk for jaw problems or a broken thigh bone. Tell your doctor right away if you have severe pain in your jaw, bones, joints, or muscles. Tell your doctor if you have any pain that does not go away or that gets worse. Tell your dentist and dental surgeon that you are taking this medicine. You should not have major dental surgery while on this medicine. See your dentist to have a dental exam and fix any dental problems before starting this medicine. Take good care of your teeth while on this medicine. Make sure you see your dentist for regular follow-up appointments. What side effects may I notice from receiving this medicine? Side effects that   you should report to your doctor or health care professional as soon as possible: -allergic reactions like skin rash, itching or hives, swelling of the face, lips, or tongue -anxiety, confusion, or depression -breathing problems -changes in vision -eye pain -feeling faint or  lightheaded, falls -jaw pain, especially after dental work -mouth sores -muscle cramps, stiffness, or weakness -trouble passing urine or change in the amount of urine Side effects that usually do not require medical attention (report to your doctor or health care professional if they continue or are bothersome): -bone, joint, or muscle pain -constipation -diarrhea -fever -hair loss -irritation at site where injected -loss of appetite -nausea, vomiting -stomach upset -trouble sleeping -trouble swallowing -weak or tired This list may not describe all possible side effects. Call your doctor for medical advice about side effects. You may report side effects to FDA at 1-800-FDA-1088. Where should I keep my medicine? This drug is given in a hospital or clinic and will not be stored at home. NOTE: This sheet is a summary. It may not cover all possible information. If you have questions about this medicine, talk to your doctor, pharmacist, or health care provider.  2014, [employer]vier/[employer] Standard. (2013-01-12 13:03:13) Carfilzomib injection What is this medicine? CARFILZOMIB (kar FILZ oh mib) is a chemotherapy drug that works by slowing or stopping cancer cell growth. This medicine is used to treat multiple myeloma. This medicine may be used for other purposes; ask your health care provider or pharmacist if you have questions. COMMON BRAND NAME(S): KYPROLIS What should I tell my health care provider before I take this medicine? They need to know if you have any of these conditions: -heart disease -irregular heartbeat -liver disease -lung or breathing disease -an unusual or allergic reaction to carfilzomib, or other medicines, foods, dyes, or preservatives -pregnant or trying to get pregnant -breast-feeding How should I use this medicine? This medicine is for injection or infusion into a vein. It is given by a health care professional in a hospital or clinic setting. Talk to your  pediatrician regarding the use of this medicine in children. Special care may be needed. Overdosage: If you think you've taken too much of this medicine contact a poison control center or emergency room at once. Overdosage: If you think you have taken too much of this medicine contact a poison control center or emergency room at once. NOTE: This medicine is only for you. Do not share this medicine with others. What if I miss a dose? It is important not to miss your dose. Call your doctor or health care professional if you are unable to keep an appointment. What may interact with this medicine? Interactions are not expected. Give your health care provider a list of all the medicines, herbs, non-prescription drugs, or dietary supplements you use. Also tell them if you smoke, drink alcohol, or use illegal drugs. Some items may interact with your medicine. This list may not describe all possible interactions. Give your health care provider a list of all the medicines, herbs, non-prescription drugs, or dietary supplements you use. Also tell them if you smoke, drink alcohol, or use illegal drugs. Some items may interact with your medicine. What should I watch for while using this medicine? Your condition will be monitored carefully while you are receiving this medicine. Report any side effects. Continue your course of treatment even though you feel ill unless your doctor tells you to stop. Call your doctor or health care professional for advice if you get a fever, chills  or sore throat, or other symptoms of a cold or flu. Do not treat yourself. Try to avoid being around people who are sick. Do not become pregnant while taking this medicine. Women should inform their doctor if they wish to become pregnant or think they might be pregnant. There is a potential for serious side effects to an unborn child. Talk to your health care professional or pharmacist for more information. Do not breast-feed an infant while  taking this medicine. Check with your doctor or health care professional if you get an attack of severe diarrhea, nausea and vomiting, or if you sweat a lot. The loss of too much body fluid can make it dangerous for you to take this medicine. You may get dizzy. Do not drive, use machinery, or do anything that needs mental alertness until you know how this medicine affects you. Do not stand or sit up quickly, especially if you are an older patient. This reduces the risk of dizzy or fainting spells. What side effects may I notice from receiving this medicine? Side effects that you should report to your doctor or health care professional as soon as possible: -allergic reactions like skin rash, itching or hives, swelling of the face, lips, or tongue -breathing problems -chest pain or palpitationschest tightness -cough -dark urine -dizziness -feeling faint or lightheaded -fever or chills -general ill feeling or flu-like symptoms -light-colored stools -palpitations -right upper belly pain -swelling of the legs or ankles -unusual bleeding or bruising -unusually weak or tired -yellowing of the eyes or skin  Side effects that usually do not require medical attention (Report these to your doctor or health care professional if they continue or are bothersome.): -diarrhea -headache -nausea, vomiting -tiredness This list may not describe all possible side effects. Call your doctor for medical advice about side effects. You may report side effects to FDA at 1-800-FDA-1088. Where should I keep my medicine? This drug is given in a hospital or clinic and will not be stored at home. NOTE: This sheet is a summary. It may not cover all possible information. If you have questions about this medicine, talk to your doctor, pharmacist, or health care provider.  2014, [employer]vier/Gold Standard. (2012-01-22 17:02:29)

## 2014-01-01 ENCOUNTER — Telehealth: Payer: Self-pay | Admitting: *Deleted

## 2014-01-01 LAB — PROTEIN ELECTROPHORESIS, SERUM
ALBUMIN ELP: 57.1 % (ref 55.8–66.1)
ALPHA-1-GLOBULIN: 8.4 % — AB (ref 2.9–4.9)
ALPHA-2-GLOBULIN: 16.2 % — AB (ref 7.1–11.8)
Beta 2: 5.2 % (ref 3.2–6.5)
Beta Globulin: 8 % — ABNORMAL HIGH (ref 4.7–7.2)
GAMMA GLOBULIN: 5.1 % — AB (ref 11.1–18.8)
M-Spike, %: 0.14 g/dL
Total Protein, Serum Electrophoresis: 5.3 g/dL — ABNORMAL LOW (ref 6.0–8.3)

## 2014-01-01 LAB — IGG, IGA, IGM
IGA: 11 mg/dL — AB (ref 68–379)
IgG (Immunoglobin G), Serum: 233 mg/dL — ABNORMAL LOW (ref 650–1600)
IgM, Serum: <4 mg/dL — ABNORMAL LOW (ref 41–251)

## 2014-01-01 LAB — KAPPA/LAMBDA LIGHT CHAINS
KAPPA LAMBDA RATIO: 0 — AB (ref 0.26–1.65)
Kappa free light chain: 0.03 mg/dL — ABNORMAL LOW (ref 0.33–1.94)
Lambda Free Lght Chn: 261 mg/dL — ABNORMAL HIGH (ref 0.57–2.63)

## 2014-01-01 NOTE — Telephone Encounter (Addendum)
Message copied by Orlando Penner on Mon Jan 01, 2014  9:03 AM ------      Message from: Volanda Napoleon      Created: Sun Dec 31, 2013  7:57 PM       Call - myeloma light chains still coming down slowly!!!!  Laurey Arrow ------This message left on pts home answering machine

## 2014-01-17 ENCOUNTER — Other Ambulatory Visit: Payer: Self-pay | Admitting: *Deleted

## 2014-01-17 DIAGNOSIS — C9002 Multiple myeloma in relapse: Secondary | ICD-10-CM

## 2014-01-18 ENCOUNTER — Other Ambulatory Visit (HOSPITAL_BASED_OUTPATIENT_CLINIC_OR_DEPARTMENT_OTHER): Payer: Medicare HMO | Admitting: Lab

## 2014-01-18 ENCOUNTER — Ambulatory Visit (HOSPITAL_BASED_OUTPATIENT_CLINIC_OR_DEPARTMENT_OTHER): Payer: Medicare HMO

## 2014-01-18 VITALS — BP 127/75 | HR 85 | Temp 97.1°F | Resp 18

## 2014-01-18 DIAGNOSIS — C9002 Multiple myeloma in relapse: Secondary | ICD-10-CM

## 2014-01-18 DIAGNOSIS — N189 Chronic kidney disease, unspecified: Secondary | ICD-10-CM

## 2014-01-18 DIAGNOSIS — D631 Anemia in chronic kidney disease: Secondary | ICD-10-CM

## 2014-01-18 DIAGNOSIS — Z5112 Encounter for antineoplastic immunotherapy: Secondary | ICD-10-CM

## 2014-01-18 LAB — CBC WITH DIFFERENTIAL (CANCER CENTER ONLY)
BASO#: 0 10*3/uL (ref 0.0–0.2)
BASO%: 0.4 % (ref 0.0–2.0)
EOS ABS: 0.1 10*3/uL (ref 0.0–0.5)
EOS%: 1.8 % (ref 0.0–7.0)
HCT: 28.3 % — ABNORMAL LOW (ref 38.7–49.9)
HEMOGLOBIN: 9.3 g/dL — AB (ref 13.0–17.1)
LYMPH#: 1 10*3/uL (ref 0.9–3.3)
LYMPH%: 19.5 % (ref 14.0–48.0)
MCH: 29.6 pg (ref 28.0–33.4)
MCHC: 32.9 g/dL (ref 32.0–35.9)
MCV: 90 fL (ref 82–98)
MONO#: 0.6 10*3/uL (ref 0.1–0.9)
MONO%: 13.1 % — ABNORMAL HIGH (ref 0.0–13.0)
NEUT%: 65.2 % (ref 40.0–80.0)
NEUTROS ABS: 3.2 10*3/uL (ref 1.5–6.5)
Platelets: 185 10*3/uL (ref 145–400)
RBC: 3.14 10*6/uL — AB (ref 4.20–5.70)
RDW: 19.7 % — ABNORMAL HIGH (ref 11.1–15.7)
WBC: 4.9 10*3/uL (ref 4.0–10.0)

## 2014-01-18 LAB — CMP (CANCER CENTER ONLY)
ALBUMIN: 3.1 g/dL — AB (ref 3.3–5.5)
ALT(SGPT): 11 U/L (ref 10–47)
AST: 19 U/L (ref 11–38)
Alkaline Phosphatase: 57 U/L (ref 26–84)
BUN, Bld: 16 mg/dL (ref 7–22)
CALCIUM: 8.6 mg/dL (ref 8.0–10.3)
CHLORIDE: 106 meq/L (ref 98–108)
CO2: 26 mEq/L (ref 18–33)
Creat: 1.2 mg/dl (ref 0.6–1.2)
GLUCOSE: 137 mg/dL — AB (ref 73–118)
POTASSIUM: 3.5 meq/L (ref 3.3–4.7)
Sodium: 142 mEq/L (ref 128–145)
Total Bilirubin: 0.7 mg/dl (ref 0.20–1.60)
Total Protein: 6.2 g/dL — ABNORMAL LOW (ref 6.4–8.1)

## 2014-01-18 MED ORDER — DEXAMETHASONE SODIUM PHOSPHATE 10 MG/ML IJ SOLN
INTRAMUSCULAR | Status: AC
  Filled 2014-01-18: qty 1

## 2014-01-18 MED ORDER — DEXAMETHASONE SODIUM PHOSPHATE 10 MG/ML IJ SOLN
10.0000 mg | Freq: Once | INTRAMUSCULAR | Status: AC
  Administered 2014-01-18: 10 mg via INTRAVENOUS

## 2014-01-18 MED ORDER — CARFILZOMIB CHEMO INJECTION 60 MG
56.0000 mg/m2 | Freq: Once | INTRAVENOUS | Status: AC
  Administered 2014-01-18: 120 mg via INTRAVENOUS
  Filled 2014-01-18: qty 60

## 2014-01-18 MED ORDER — ONDANSETRON 8 MG/50ML IVPB (CHCC)
8.0000 mg | Freq: Once | INTRAVENOUS | Status: AC
  Administered 2014-01-18: 8 mg via INTRAVENOUS

## 2014-01-18 MED ORDER — SODIUM CHLORIDE 0.9 % IV SOLN
Freq: Once | INTRAVENOUS | Status: AC
  Administered 2014-01-18: 13:00:00 via INTRAVENOUS

## 2014-01-18 MED ORDER — SODIUM CHLORIDE 0.9 % IV SOLN: Freq: Once | INTRAVENOUS | Status: DC

## 2014-01-18 NOTE — Patient Instructions (Signed)
[facility] Discharge Instructions for Patients Receiving Chemotherapy  Today you received the following chemotherapy agents Kyprolis  To help prevent nausea and vomiting after your treatment, we encourage you to take your nausea medication as prescribed for nausea and vomiting that is not controlled by your nausea medication, call the clinic.   BELOW ARE SYMPTOMS THAT SHOULD BE REPORTED IMMEDIATELY:  *FEVER GREATER THAN 100.5 F  *CHILLS WITH OR WITHOUT FEVER  NAUSEA AND VOMITING THAT IS NOT CONTROLLED WITH YOUR NAUSEA MEDICATION  *UNUSUAL SHORTNESS OF BREATH  *UNUSUAL BRUISING OR BLEEDING  TENDERNESS IN MOUTH AND THROAT WITH OR WITHOUT PRESENCE OF ULCERS  *URINARY PROBLEMS  *BOWEL PROBLEMS  UNUSUAL RASH Items with * indicate a potential emergency and should be followed up as soon as possible.  Feel free to call the clinic you have any questions or concerns. The clinic phone number is [phone].

## 2014-01-19 ENCOUNTER — Ambulatory Visit (HOSPITAL_BASED_OUTPATIENT_CLINIC_OR_DEPARTMENT_OTHER): Payer: Medicare HMO

## 2014-01-19 VITALS — BP 126/64 | HR 98 | Temp 97.0°F | Resp 16

## 2014-01-19 DIAGNOSIS — Z5112 Encounter for antineoplastic immunotherapy: Secondary | ICD-10-CM

## 2014-01-19 DIAGNOSIS — C9002 Multiple myeloma in relapse: Secondary | ICD-10-CM

## 2014-01-19 MED ORDER — SODIUM CHLORIDE 0.9 % IJ SOLN
10.0000 mL | INTRAMUSCULAR | Status: DC | PRN
  Filled 2014-01-19: qty 10

## 2014-01-19 MED ORDER — ALTEPLASE 2 MG IJ SOLR
2.0000 mg | Freq: Once | INTRAMUSCULAR | Status: DC | PRN
  Filled 2014-01-19: qty 2

## 2014-01-19 MED ORDER — DEXAMETHASONE SODIUM PHOSPHATE 10 MG/ML IJ SOLN
10.0000 mg | Freq: Once | INTRAMUSCULAR | Status: AC
  Administered 2014-01-19: 10 mg via INTRAVENOUS

## 2014-01-19 MED ORDER — DEXTROSE 5 % IV SOLN
56.0000 mg/m2 | Freq: Once | INTRAVENOUS | Status: AC
  Administered 2014-01-19: 120 mg via INTRAVENOUS
  Filled 2014-01-19: qty 60

## 2014-01-19 MED ORDER — SODIUM CHLORIDE 0.9 % IV SOLN: Freq: Once | INTRAVENOUS | Status: DC

## 2014-01-19 MED ORDER — HEPARIN SOD (PORK) LOCK FLUSH 100 UNIT/ML IV SOLN
500.0000 [IU] | Freq: Once | INTRAVENOUS | Status: DC | PRN
  Filled 2014-01-19: qty 5

## 2014-01-19 MED ORDER — ONDANSETRON 8 MG/50ML IVPB (CHCC)
8.0000 mg | Freq: Once | INTRAVENOUS | Status: AC
Start: 2014-01-19 — End: 2014-01-19
  Administered 2014-01-19: 8 mg via INTRAVENOUS

## 2014-01-19 MED ORDER — DEXAMETHASONE SODIUM PHOSPHATE 10 MG/ML IJ SOLN
INTRAMUSCULAR | Status: AC
  Filled 2014-01-19: qty 1

## 2014-01-19 MED ORDER — HEPARIN SOD (PORK) LOCK FLUSH 100 UNIT/ML IV SOLN
250.0000 [IU] | Freq: Once | INTRAVENOUS | Status: DC | PRN
  Filled 2014-01-19: qty 5

## 2014-01-19 MED ORDER — SODIUM CHLORIDE 0.9 % IV SOLN
Freq: Once | INTRAVENOUS | Status: AC
  Administered 2014-01-19: 13:00:00 via INTRAVENOUS

## 2014-01-19 MED ORDER — SODIUM CHLORIDE 0.9 % IJ SOLN
3.0000 mL | INTRAMUSCULAR | Status: DC | PRN
  Filled 2014-01-19: qty 10

## 2014-01-25 ENCOUNTER — Ambulatory Visit (HOSPITAL_COMMUNITY)
Admission: RE | Admit: 2014-01-25 | Discharge: 2014-01-25 | Disposition: A | Discharge status: Home / Self Care | Payer: Medicare HMO | Source: Ambulatory Visit | Attending: Hematology & Oncology | Admitting: Hematology & Oncology

## 2014-01-25 ENCOUNTER — Ambulatory Visit (HOSPITAL_BASED_OUTPATIENT_CLINIC_OR_DEPARTMENT_OTHER): Payer: Medicare HMO | Admitting: Lab

## 2014-01-25 ENCOUNTER — Telehealth: Payer: Self-pay | Admitting: Hematology & Oncology

## 2014-01-25 ENCOUNTER — Ambulatory Visit (HOSPITAL_BASED_OUTPATIENT_CLINIC_OR_DEPARTMENT_OTHER): Payer: Medicare HMO

## 2014-01-25 VITALS — BP 133/78 | HR 88 | Temp 97.8°F | Resp 14

## 2014-01-25 DIAGNOSIS — N189 Chronic kidney disease, unspecified: Secondary | ICD-10-CM

## 2014-01-25 DIAGNOSIS — N039 Chronic nephritic syndrome with unspecified morphologic changes: Secondary | ICD-10-CM

## 2014-01-25 DIAGNOSIS — D631 Anemia in chronic kidney disease: Secondary | ICD-10-CM | POA: Insufficient documentation

## 2014-01-25 DIAGNOSIS — C9002 Multiple myeloma in relapse: Secondary | ICD-10-CM | POA: Insufficient documentation

## 2014-01-25 DIAGNOSIS — Z5112 Encounter for antineoplastic immunotherapy: Secondary | ICD-10-CM

## 2014-01-25 LAB — CBC WITH DIFFERENTIAL (CANCER CENTER ONLY)
BASO#: 0 10*3/uL (ref 0.0–0.2)
BASO%: 0.2 % (ref 0.0–2.0)
EOS%: 2.1 % (ref 0.0–7.0)
Eosinophils Absolute: 0.1 10*3/uL (ref 0.0–0.5)
HCT: 26.4 % — ABNORMAL LOW (ref 38.7–49.9)
HGB: 8.6 g/dL — ABNORMAL LOW (ref 13.0–17.1)
LYMPH#: 1.2 10*3/uL (ref 0.9–3.3)
LYMPH%: 19.4 % (ref 14.0–48.0)
MCH: 29.7 pg (ref 28.0–33.4)
MCHC: 32.6 g/dL (ref 32.0–35.9)
MCV: 91 fL (ref 82–98)
MONO#: 1 10*3/uL — ABNORMAL HIGH (ref 0.1–0.9)
MONO%: 16.3 % — ABNORMAL HIGH (ref 0.0–13.0)
NEUT#: 3.8 10*3/uL (ref 1.5–6.5)
NEUT%: 62 % (ref 40.0–80.0)
Platelets: 95 10*3/uL — ABNORMAL LOW (ref 145–400)
RBC: 2.9 10*6/uL — ABNORMAL LOW (ref 4.20–5.70)
RDW: 20.6 % — AB (ref 11.1–15.7)
WBC: 6.1 10*3/uL (ref 4.0–10.0)

## 2014-01-25 LAB — CMP (CANCER CENTER ONLY)
ALBUMIN: 3.3 g/dL (ref 3.3–5.5)
ALT: 8 U/L — AB (ref 10–47)
AST: 16 U/L (ref 11–38)
Alkaline Phosphatase: 47 U/L (ref 26–84)
BUN, Bld: 16 mg/dL (ref 7–22)
CO2: 26 meq/L (ref 18–33)
Calcium: 8.7 mg/dL (ref 8.0–10.3)
Chloride: 107 mEq/L (ref 98–108)
Creat: 1.2 mg/dl (ref 0.6–1.2)
GLUCOSE: 147 mg/dL — AB (ref 73–118)
Potassium: 3.4 mEq/L (ref 3.3–4.7)
SODIUM: 144 meq/L (ref 128–145)
TOTAL PROTEIN: 5.8 g/dL — AB (ref 6.4–8.1)
Total Bilirubin: 1 mg/dl (ref 0.20–1.60)

## 2014-01-25 MED ORDER — DEXAMETHASONE SODIUM PHOSPHATE 10 MG/ML IJ SOLN
10.0000 mg | Freq: Once | INTRAMUSCULAR | Status: AC
  Administered 2014-01-25: 10 mg via INTRAVENOUS

## 2014-01-25 MED ORDER — ONDANSETRON 8 MG/50ML IVPB (CHCC)
8.0000 mg | Freq: Once | INTRAVENOUS | Status: AC
  Administered 2014-01-25: 8 mg via INTRAVENOUS

## 2014-01-25 MED ORDER — FENTANYL 12 MCG/HR TD PT72: 12.5000 ug | MEDICATED_PATCH | TRANSDERMAL | Status: DC

## 2014-01-25 MED ORDER — DEXAMETHASONE SODIUM PHOSPHATE 10 MG/ML IJ SOLN
INTRAMUSCULAR | Status: AC
  Filled 2014-01-25: qty 1

## 2014-01-25 MED ORDER — DEXTROSE 5 % IV SOLN
56.0000 mg/m2 | Freq: Once | INTRAVENOUS | Status: AC
  Administered 2014-01-25: 120 mg via INTRAVENOUS
  Filled 2014-01-25: qty 60

## 2014-01-25 MED ORDER — SODIUM CHLORIDE 0.9 % IV SOLN
Freq: Once | INTRAVENOUS | Status: AC
  Administered 2014-01-25: 12:00:00 via INTRAVENOUS

## 2014-01-25 MED ORDER — SODIUM CHLORIDE 0.9 % IJ SOLN
10.0000 mL | INTRAMUSCULAR | Status: DC | PRN
  Filled 2014-01-25: qty 10

## 2014-01-25 MED ORDER — HEPARIN SOD (PORK) LOCK FLUSH 100 UNIT/ML IV SOLN
500.0000 [IU] | Freq: Once | INTRAVENOUS | Status: AC | PRN
  Filled 2014-01-25: qty 5

## 2014-01-25 NOTE — Patient Instructions (Signed)
[facility] Discharge Instructions for Patients Receiving Chemotherapy  Today you received the following chemotherapy agents Kyprolis To help prevent nausea and vomiting after your treatment, we encourage you to take your nausea medication as prescribed.  If you develop nausea and vomiting that is not controlled by your nausea medication, call the clinic.   BELOW ARE SYMPTOMS THAT SHOULD BE REPORTED IMMEDIATELY:  *FEVER GREATER THAN 100.5 F  *CHILLS WITH OR WITHOUT FEVER  NAUSEA AND VOMITING THAT IS NOT CONTROLLED WITH YOUR NAUSEA MEDICATION  *UNUSUAL SHORTNESS OF BREATH  *UNUSUAL BRUISING OR BLEEDING  TENDERNESS IN MOUTH AND THROAT WITH OR WITHOUT PRESENCE OF ULCERS  *URINARY PROBLEMS  *BOWEL PROBLEMS  UNUSUAL RASH Items with * indicate a potential emergency and should be followed up as soon as possible.  Feel free to call the clinic you have any questions or concerns. The clinic phone number is [phone].    

## 2014-01-25 NOTE — Telephone Encounter (Signed)
[employer] MEDICARE - NPR  J1100 PR DEXAMETHASONE SODIUM PHOS J2405 PR ONDANSETRON HCL INJECTION  J7050 PR NORMAL SALINE SOLUTION INFUS J9047 PR INJECTION, CARFILZOMIB, 1 MG J3490 PR DRUGS UNCLASSIFIED INJECTION

## 2014-01-26 ENCOUNTER — Other Ambulatory Visit: Payer: Self-pay | Admitting: Nurse Practitioner

## 2014-01-26 ENCOUNTER — Ambulatory Visit: Payer: Medicare HMO

## 2014-01-26 ENCOUNTER — Ambulatory Visit (HOSPITAL_BASED_OUTPATIENT_CLINIC_OR_DEPARTMENT_OTHER): Payer: Medicare HMO

## 2014-01-26 ENCOUNTER — Other Ambulatory Visit (HOSPITAL_BASED_OUTPATIENT_CLINIC_OR_DEPARTMENT_OTHER): Payer: Medicare HMO | Admitting: Lab

## 2014-01-26 VITALS — BP 127/72 | HR 87 | Temp 97.3°F | Resp 14

## 2014-01-26 DIAGNOSIS — C9002 Multiple myeloma in relapse: Secondary | ICD-10-CM

## 2014-01-26 DIAGNOSIS — Z5112 Encounter for antineoplastic immunotherapy: Secondary | ICD-10-CM

## 2014-01-26 DIAGNOSIS — N189 Chronic kidney disease, unspecified: Secondary | ICD-10-CM

## 2014-01-26 DIAGNOSIS — D631 Anemia in chronic kidney disease: Secondary | ICD-10-CM

## 2014-01-26 LAB — CBC WITH DIFFERENTIAL (CANCER CENTER ONLY)
BASO#: 0 10*3/uL (ref 0.0–0.2)
BASO%: 0.1 % (ref 0.0–2.0)
EOS%: 0.3 % (ref 0.0–7.0)
Eosinophils Absolute: 0 10*3/uL (ref 0.0–0.5)
HEMATOCRIT: 23.9 % — AB (ref 38.7–49.9)
HGB: 7.6 g/dL — ABNORMAL LOW (ref 13.0–17.1)
LYMPH#: 1.2 10*3/uL (ref 0.9–3.3)
LYMPH%: 12.2 % — ABNORMAL LOW (ref 14.0–48.0)
MCH: 29.5 pg (ref 28.0–33.4)
MCHC: 31.8 g/dL — ABNORMAL LOW (ref 32.0–35.9)
MCV: 93 fL (ref 82–98)
MONO#: 1.5 10*3/uL — AB (ref 0.1–0.9)
MONO%: 14.8 % — AB (ref 0.0–13.0)
NEUT#: 7.4 10*3/uL — ABNORMAL HIGH (ref 1.5–6.5)
NEUT%: 72.6 % (ref 40.0–80.0)
Platelets: 90 10*3/uL — ABNORMAL LOW (ref 145–400)
RBC: 2.58 10*6/uL — AB (ref 4.20–5.70)
RDW: 20.8 % — ABNORMAL HIGH (ref 11.1–15.7)
WBC: 10.2 10*3/uL — AB (ref 4.0–10.0)

## 2014-01-26 LAB — HOLD TUBE, BLOOD BANK - CHCC SATELLITE

## 2014-01-26 MED ORDER — ONDANSETRON 8 MG/50ML IVPB (CHCC)
8.0000 mg | Freq: Once | INTRAVENOUS | Status: AC
  Administered 2014-01-26: 8 mg via INTRAVENOUS

## 2014-01-26 MED ORDER — DEXAMETHASONE SODIUM PHOSPHATE 10 MG/ML IJ SOLN
10.0000 mg | Freq: Once | INTRAMUSCULAR | Status: AC
  Administered 2014-01-26: 10 mg via INTRAVENOUS

## 2014-01-26 MED ORDER — SODIUM CHLORIDE 0.9 % IV SOLN
Freq: Once | INTRAVENOUS | Status: AC
  Administered 2014-01-26: 11:00:00 via INTRAVENOUS

## 2014-01-26 MED ORDER — HEPARIN SOD (PORK) LOCK FLUSH 100 UNIT/ML IV SOLN
500.0000 [IU] | Freq: Once | INTRAVENOUS | Status: DC | PRN
  Filled 2014-01-26: qty 5

## 2014-01-26 MED ORDER — DEXTROSE 5 % IV SOLN
56.0000 mg/m2 | Freq: Once | INTRAVENOUS | Status: AC
  Administered 2014-01-26: 120 mg via INTRAVENOUS
  Filled 2014-01-26: qty 60

## 2014-01-26 MED ORDER — SODIUM CHLORIDE 0.9 % IJ SOLN
10.0000 mL | INTRAMUSCULAR | Status: DC | PRN
  Filled 2014-01-26: qty 10

## 2014-01-26 MED ORDER — DEXAMETHASONE SODIUM PHOSPHATE 10 MG/ML IJ SOLN
INTRAMUSCULAR | Status: AC
  Filled 2014-01-26: qty 1

## 2014-01-26 NOTE — Patient Instructions (Signed)
Carfilzomib injection What is this medicine? CARFILZOMIB (kar FILZ oh mib) is a chemotherapy drug that works by slowing or stopping cancer cell growth. This medicine is used to treat multiple myeloma. This medicine may be used for other purposes; ask your health care provider or pharmacist if you have questions. COMMON BRAND NAME(S): KYPROLIS What should I tell my health care provider before I take this medicine? They need to know if you have any of these conditions: -heart disease -irregular heartbeat -liver disease -lung or breathing disease -an unusual or allergic reaction to carfilzomib, or other medicines, foods, dyes, or preservatives -pregnant or trying to get pregnant -breast-feeding How should I use this medicine? This medicine is for injection or infusion into a vein. It is given by a health care professional in a hospital or clinic setting. Talk to your pediatrician regarding the use of this medicine in children. Special care may be needed. Overdosage: If you think you've taken too much of this medicine contact a poison control center or emergency room at once. Overdosage: If you think you have taken too much of this medicine contact a poison control center or emergency room at once. NOTE: This medicine is only for you. Do not share this medicine with others. What if I miss a dose? It is important not to miss your dose. Call your doctor or health care professional if you are unable to keep an appointment. What may interact with this medicine? Interactions are not expected. Give your health care provider a list of all the medicines, herbs, non-prescription drugs, or dietary supplements you use. Also tell them if you smoke, drink alcohol, or use illegal drugs. Some items may interact with your medicine. This list may not describe all possible interactions. Give your health care provider a list of all the medicines, herbs, non-prescription drugs, or dietary supplements you use. Also  tell them if you smoke, drink alcohol, or use illegal drugs. Some items may interact with your medicine. What should I watch for while using this medicine? Your condition will be monitored carefully while you are receiving this medicine. Report any side effects. Continue your course of treatment even though you feel ill unless your doctor tells you to stop. Call your doctor or health care professional for advice if you get a fever, chills or sore throat, or other symptoms of a cold or flu. Do not treat yourself. Try to avoid being around people who are sick. Do not become pregnant while taking this medicine. Women should inform their doctor if they wish to become pregnant or think they might be pregnant. There is a potential for serious side effects to an unborn child. Talk to your health care professional or pharmacist for more information. Do not breast-feed an infant while taking this medicine. Check with your doctor or health care professional if you get an attack of severe diarrhea, nausea and vomiting, or if you sweat a lot. The loss of too much body fluid can make it dangerous for you to take this medicine. You may get dizzy. Do not drive, use machinery, or do anything that needs mental alertness until you know how this medicine affects you. Do not stand or sit up quickly, especially if you are an older patient. This reduces the risk of dizzy or fainting spells. What side effects may I notice from receiving this medicine? Side effects that you should report to your doctor or health care professional as soon as possible: -allergic reactions like skin rash, itching or hives,  swelling of the face, lips, or tongue -breathing problems -chest pain or palpitationschest tightness -cough -dark urine -dizziness -feeling faint or lightheaded -fever or chills -general ill feeling or flu-like symptoms -light-colored stools -palpitations -right upper belly pain -swelling of the legs or ankles -unusual  bleeding or bruising -unusually weak or tired -yellowing of the eyes or skin  Side effects that usually do not require medical attention (Report these to your doctor or health care professional if they continue or are bothersome.): -diarrhea -headache -nausea, vomiting -tiredness This list may not describe all possible side effects. Call your doctor for medical advice about side effects. You may report side effects to FDA at 1-800-FDA-1088. Where should I keep my medicine? This drug is given in a hospital or clinic and will not be stored at home. NOTE: This sheet is a summary. It may not cover all possible information. If you have questions about this medicine, talk to your doctor, pharmacist, or health care provider.  2014, Elsevier/Gold Standard. (2012-01-22 17:02:29)  

## 2014-01-29 ENCOUNTER — Ambulatory Visit (HOSPITAL_BASED_OUTPATIENT_CLINIC_OR_DEPARTMENT_OTHER): Payer: Medicare HMO

## 2014-01-29 VITALS — BP 159/90 | HR 72 | Temp 97.1°F | Resp 16

## 2014-01-29 DIAGNOSIS — C9000 Multiple myeloma not having achieved remission: Secondary | ICD-10-CM

## 2014-01-29 DIAGNOSIS — C9002 Multiple myeloma in relapse: Secondary | ICD-10-CM

## 2014-01-29 DIAGNOSIS — N189 Chronic kidney disease, unspecified: Secondary | ICD-10-CM

## 2014-01-29 DIAGNOSIS — D631 Anemia in chronic kidney disease: Secondary | ICD-10-CM

## 2014-01-29 LAB — PROTEIN ELECTROPHORESIS, SERUM, WITH REFLEX
ALBUMIN ELP: 60.8 % (ref 55.8–66.1)
ALPHA-2-GLOBULIN: 12.9 % — AB (ref 7.1–11.8)
Alpha-1-Globulin: 10.8 % — ABNORMAL HIGH (ref 2.9–4.9)
Beta 2: 3.5 % (ref 3.2–6.5)
Beta Globulin: 7.5 % — ABNORMAL HIGH (ref 4.7–7.2)
Gamma Globulin: 4.5 % — ABNORMAL LOW (ref 11.1–18.8)
M-Spike, %: 0.14 g/dL
TOTAL PROTEIN, SERUM ELECTROPHOR: 5.2 g/dL — AB (ref 6.0–8.3)

## 2014-01-29 LAB — KAPPA/LAMBDA LIGHT CHAINS
Kappa free light chain: 0.03 mg/dL — ABNORMAL LOW (ref 0.33–1.94)
Kappa:Lambda Ratio: 0 — ABNORMAL LOW (ref 0.26–1.65)
Lambda Free Lght Chn: 254 mg/dL — ABNORMAL HIGH (ref 0.57–2.63)

## 2014-01-29 LAB — IGG, IGA, IGM
IgA: 9 mg/dL — ABNORMAL LOW (ref 68–379)
IgG (Immunoglobin G), Serum: 203 mg/dL — ABNORMAL LOW (ref 650–1600)
IgM, Serum: <5 mg/dL — ABNORMAL LOW (ref 41–251)

## 2014-01-29 LAB — LACTATE DEHYDROGENASE: LDH: 185 U/L (ref 94–250)

## 2014-01-29 LAB — PREPARE RBC (CROSSMATCH)

## 2014-01-29 LAB — IFE INTERPRETATION

## 2014-01-29 MED ORDER — DIPHENHYDRAMINE HCL 25 MG PO CAPS
25.0000 mg | ORAL_CAPSULE | Freq: Once | ORAL | Status: AC
  Administered 2014-01-29: 25 mg via ORAL

## 2014-01-29 MED ORDER — HEPARIN SOD (PORK) LOCK FLUSH 100 UNIT/ML IV SOLN
500.0000 [IU] | Freq: Once | INTRAVENOUS | Status: AC
  Administered 2014-01-29: 500 [IU] via INTRAVENOUS
  Filled 2014-01-29: qty 5

## 2014-01-29 MED ORDER — ACETAMINOPHEN 325 MG PO TABS
650.0000 mg | ORAL_TABLET | Freq: Once | ORAL | Status: AC
  Administered 2014-01-29: 650 mg via ORAL

## 2014-01-29 MED ORDER — FUROSEMIDE 10 MG/ML IJ SOLN
INTRAMUSCULAR | Status: AC
  Filled 2014-01-29: qty 4

## 2014-01-29 MED ORDER — DIPHENHYDRAMINE HCL 25 MG PO CAPS
ORAL_CAPSULE | ORAL | Status: AC
  Filled 2014-01-29: qty 1

## 2014-01-29 MED ORDER — SODIUM CHLORIDE 0.9 % IV SOLN
250.0000 mL | Freq: Once | INTRAVENOUS | Status: AC
  Administered 2014-01-29: 250 mL via INTRAVENOUS

## 2014-01-29 MED ORDER — FUROSEMIDE 10 MG/ML IJ SOLN
20.0000 mg | Freq: Once | INTRAMUSCULAR | Status: AC
  Administered 2014-01-29: 15:00:00 via INTRAVENOUS

## 2014-01-29 MED ORDER — ACETAMINOPHEN 325 MG PO TABS
ORAL_TABLET | ORAL | Status: AC
  Filled 2014-01-29: qty 2

## 2014-01-29 MED ORDER — SODIUM CHLORIDE 0.9 % IJ SOLN
10.0000 mL | INTRAMUSCULAR | Status: AC | PRN
  Administered 2014-01-29: 10 mL
  Filled 2014-01-29: qty 10

## 2014-01-29 NOTE — Patient Instructions (Signed)
Blood Transfusion Information WHAT IS A BLOOD TRANSFUSION? A transfusion is the replacement of blood or some of its parts. Blood is made up of multiple cells which provide different functions.  Red blood cells carry oxygen and are used for blood loss replacement.  White blood cells fight against infection.  Platelets control bleeding.  Plasma helps clot blood.  Other blood products are available for specialized needs, such as hemophilia or other clotting disorders. BEFORE THE TRANSFUSION  Who gives blood for transfusions?   You may be able to donate blood to be used at a later date on yourself (autologous donation).  Relatives can be asked to donate blood. This is generally not any safer than if you have received blood from a stranger. The same precautions are taken to ensure safety when a relative's blood is donated.  Healthy volunteers who are fully evaluated to make sure their blood is safe. This is blood bank blood. Transfusion therapy is the safest it has ever been in the practice of medicine. Before blood is taken from a donor, a complete history is taken to make sure that person has no history of diseases nor engages in risky social behavior (examples are intravenous drug use or sexual activity with multiple partners). The donor's travel history is screened to minimize risk of transmitting infections, such as malaria. The donated blood is tested for signs of infectious diseases, such as HIV and hepatitis. The blood is then tested to be sure it is compatible with you in order to minimize the chance of a transfusion reaction. If you or a relative donates blood, this is often done in anticipation of surgery and is not appropriate for emergency situations. It takes many days to process the donated blood. RISKS AND COMPLICATIONS Although transfusion therapy is very safe and saves many lives, the main dangers of transfusion include:   Getting an infectious disease.  Developing a  transfusion reaction. This is an allergic reaction to something in the blood you were given. Every precaution is taken to prevent this. The decision to have a blood transfusion has been considered carefully by your caregiver before blood is given. Blood is not given unless the benefits outweigh the risks. AFTER THE TRANSFUSION  Right after receiving a blood transfusion, you will usually feel much better and more energetic. This is especially true if your red blood cells have gotten low (anemic). The transfusion raises the level of the red blood cells which carry oxygen, and this usually causes an energy increase.  The nurse administering the transfusion will monitor you carefully for complications. HOME CARE INSTRUCTIONS  No special instructions are needed after a transfusion. You may find your energy is better. Speak with your caregiver about any limitations on activity for underlying diseases you may have. SEEK MEDICAL CARE IF:   Your condition is not improving after your transfusion.  You develop redness or irritation at the intravenous (IV) site. SEEK IMMEDIATE MEDICAL CARE IF:  Any of the following symptoms occur over the next 12 hours:  Shaking chills.  You have a temperature by mouth above 102 F (38.9 C), not controlled by medicine.  Chest, back, or muscle pain.  People around you feel you are not acting correctly or are confused.  Shortness of breath or difficulty breathing.  Dizziness and fainting.  You get a rash or develop hives.  You have a decrease in urine output.  Your urine turns a dark color or changes to pink, red, or Cina. Any of the following   symptoms occur over the next 10 days:  You have a temperature by mouth above 102 F (38.9 C), not controlled by medicine.  Shortness of breath.  Weakness after normal activity.  The white part of the eye turns yellow (jaundice).  You have a decrease in the amount of urine or are urinating less often.  Your  urine turns a dark color or changes to pink, red, or Amend. Document Released: 07/31/2000 Document Revised: 10/26/2011 Document Reviewed: 03/19/2008 Exit[employer] Patient Information 2014 [employer].  

## 2014-01-30 LAB — TYPE AND SCREEN
ABO/RH(D): O POS
Antibody Screen: POSITIVE
DAT, IGG: NEGATIVE
UNIT DIVISION: 0
Unit division: 0
Unit division: 0
Unit division: 0

## 2014-01-31 ENCOUNTER — Encounter: Payer: Self-pay | Admitting: Hematology & Oncology

## 2014-02-01 ENCOUNTER — Ambulatory Visit (HOSPITAL_BASED_OUTPATIENT_CLINIC_OR_DEPARTMENT_OTHER): Payer: Medicare HMO

## 2014-02-01 ENCOUNTER — Other Ambulatory Visit: Payer: Medicare HMO | Admitting: Lab

## 2014-02-01 ENCOUNTER — Ambulatory Visit: Payer: Medicare HMO

## 2014-02-01 ENCOUNTER — Other Ambulatory Visit (HOSPITAL_BASED_OUTPATIENT_CLINIC_OR_DEPARTMENT_OTHER): Payer: Medicare HMO | Admitting: Lab

## 2014-02-01 VITALS — BP 168/85 | HR 78 | Temp 97.2°F | Resp 16

## 2014-02-01 DIAGNOSIS — C9002 Multiple myeloma in relapse: Secondary | ICD-10-CM

## 2014-02-01 DIAGNOSIS — Z5112 Encounter for antineoplastic immunotherapy: Secondary | ICD-10-CM

## 2014-02-01 LAB — CBC WITH DIFFERENTIAL (CANCER CENTER ONLY)
BASO#: 0 10*3/uL (ref 0.0–0.2)
BASO%: 0.2 % (ref 0.0–2.0)
EOS%: 1.5 % (ref 0.0–7.0)
Eosinophils Absolute: 0.1 10*3/uL (ref 0.0–0.5)
HCT: 30.3 % — ABNORMAL LOW (ref 38.7–49.9)
HGB: 10 g/dL — ABNORMAL LOW (ref 13.0–17.1)
LYMPH#: 0.7 10*3/uL — ABNORMAL LOW (ref 0.9–3.3)
LYMPH%: 13.4 % — AB (ref 14.0–48.0)
MCH: 30.3 pg (ref 28.0–33.4)
MCHC: 33 g/dL (ref 32.0–35.9)
MCV: 92 fL (ref 82–98)
MONO#: 1 10*3/uL — ABNORMAL HIGH (ref 0.1–0.9)
MONO%: 18.4 % — ABNORMAL HIGH (ref 0.0–13.0)
NEUT#: 3.6 10*3/uL (ref 1.5–6.5)
NEUT%: 66.5 % (ref 40.0–80.0)
Platelets: 81 10*3/uL — ABNORMAL LOW (ref 145–400)
RBC: 3.3 10*6/uL — AB (ref 4.20–5.70)
RDW: 19.5 % — ABNORMAL HIGH (ref 11.1–15.7)
WBC: 5.4 10*3/uL (ref 4.0–10.0)

## 2014-02-01 LAB — TECHNOLOGIST REVIEW CHCC SATELLITE

## 2014-02-01 LAB — HOLD TUBE, BLOOD BANK - CHCC SATELLITE

## 2014-02-01 MED ORDER — DEXAMETHASONE SODIUM PHOSPHATE 10 MG/ML IJ SOLN
INTRAMUSCULAR | Status: AC
  Filled 2014-02-01: qty 1

## 2014-02-01 MED ORDER — SODIUM CHLORIDE 0.9 % IV SOLN
Freq: Once | INTRAVENOUS | Status: AC
  Administered 2014-02-01: 11:00:00 via INTRAVENOUS

## 2014-02-01 MED ORDER — SODIUM CHLORIDE 0.9 % IJ SOLN
10.0000 mL | INTRAMUSCULAR | Status: DC | PRN
  Administered 2014-02-01: 10 mL
  Filled 2014-02-01: qty 10

## 2014-02-01 MED ORDER — DEXTROSE 5 % IV SOLN
56.0000 mg/m2 | Freq: Once | INTRAVENOUS | Status: AC
  Administered 2014-02-01: 120 mg via INTRAVENOUS
  Filled 2014-02-01: qty 60

## 2014-02-01 MED ORDER — HEPARIN SOD (PORK) LOCK FLUSH 100 UNIT/ML IV SOLN
500.0000 [IU] | Freq: Once | INTRAVENOUS | Status: AC | PRN
  Administered 2014-02-01: 500 [IU]
  Filled 2014-02-01: qty 5

## 2014-02-01 MED ORDER — ONDANSETRON 8 MG/50ML IVPB (CHCC)
8.0000 mg | Freq: Once | INTRAVENOUS | Status: AC
  Administered 2014-02-01: 8 mg via INTRAVENOUS

## 2014-02-01 MED ORDER — DEXAMETHASONE SODIUM PHOSPHATE 10 MG/ML IJ SOLN
10.0000 mg | Freq: Once | INTRAMUSCULAR | Status: AC
  Administered 2014-02-01: 10 mg via INTRAVENOUS

## 2014-02-01 NOTE — Patient Instructions (Signed)
Carfilzomib injection What is this medicine? CARFILZOMIB (kar FILZ oh mib) is a chemotherapy drug that works by slowing or stopping cancer cell growth. This medicine is used to treat multiple myeloma. This medicine may be used for other purposes; ask your health care provider or pharmacist if you have questions. COMMON BRAND NAME(S): KYPROLIS What should I tell my health care provider before I take this medicine? They need to know if you have any of these conditions: -heart disease -irregular heartbeat -liver disease -lung or breathing disease -an unusual or allergic reaction to carfilzomib, or other medicines, foods, dyes, or preservatives -pregnant or trying to get pregnant -breast-feeding How should I use this medicine? This medicine is for injection or infusion into a vein. It is given by a health care professional in a hospital or clinic setting. Talk to your pediatrician regarding the use of this medicine in children. Special care may be needed. Overdosage: If you think you've taken too much of this medicine contact a poison control center or emergency room at once. Overdosage: If you think you have taken too much of this medicine contact a poison control center or emergency room at once. NOTE: This medicine is only for you. Do not share this medicine with others. What if I miss a dose? It is important not to miss your dose. Call your doctor or health care professional if you are unable to keep an appointment. What may interact with this medicine? Interactions are not expected. Give your health care provider a list of all the medicines, herbs, non-prescription drugs, or dietary supplements you use. Also tell them if you smoke, drink alcohol, or use illegal drugs. Some items may interact with your medicine. This list may not describe all possible interactions. Give your health care provider a list of all the medicines, herbs, non-prescription drugs, or dietary supplements you use. Also  tell them if you smoke, drink alcohol, or use illegal drugs. Some items may interact with your medicine. What should I watch for while using this medicine? Your condition will be monitored carefully while you are receiving this medicine. Report any side effects. Continue your course of treatment even though you feel ill unless your doctor tells you to stop. Call your doctor or health care professional for advice if you get a fever, chills or sore throat, or other symptoms of a cold or flu. Do not treat yourself. Try to avoid being around people who are sick. Do not become pregnant while taking this medicine. Women should inform their doctor if they wish to become pregnant or think they might be pregnant. There is a potential for serious side effects to an unborn child. Talk to your health care professional or pharmacist for more information. Do not breast-feed an infant while taking this medicine. Check with your doctor or health care professional if you get an attack of severe diarrhea, nausea and vomiting, or if you sweat a lot. The loss of too much body fluid can make it dangerous for you to take this medicine. You may get dizzy. Do not drive, use machinery, or do anything that needs mental alertness until you know how this medicine affects you. Do not stand or sit up quickly, especially if you are an older patient. This reduces the risk of dizzy or fainting spells. What side effects may I notice from receiving this medicine? Side effects that you should report to your doctor or health care professional as soon as possible: -allergic reactions like skin rash, itching or hives,  swelling of the face, lips, or tongue -breathing problems -chest pain or palpitationschest tightness -cough -dark urine -dizziness -feeling faint or lightheaded -fever or chills -general ill feeling or flu-like symptoms -light-colored stools -palpitations -right upper belly pain -swelling of the legs or ankles -unusual  bleeding or bruising -unusually weak or tired -yellowing of the eyes or skin Side effects that usually do not require medical attention (Report these to your doctor or health care professional if they continue or are bothersome.): -diarrhea -headache -nausea, vomiting -tiredness This list may not describe all possible side effects. Call your doctor for medical advice about side effects. You may report side effects to FDA at 1-800-FDA-1088. Where should I keep my medicine? This drug is given in a hospital or clinic and will not be stored at home. NOTE: This sheet is a summary. It may not cover all possible information. If you have questions about this medicine, talk to your doctor, pharmacist, or health care provider.  2015, [employer]. (2012-01-22 17:02:29)

## 2014-02-02 ENCOUNTER — Ambulatory Visit (HOSPITAL_BASED_OUTPATIENT_CLINIC_OR_DEPARTMENT_OTHER): Payer: Medicare HMO

## 2014-02-02 ENCOUNTER — Ambulatory Visit: Payer: Medicare HMO

## 2014-02-02 VITALS — BP 144/79 | HR 89 | Temp 97.2°F

## 2014-02-02 DIAGNOSIS — Z5112 Encounter for antineoplastic immunotherapy: Secondary | ICD-10-CM

## 2014-02-02 DIAGNOSIS — C9002 Multiple myeloma in relapse: Secondary | ICD-10-CM

## 2014-02-02 MED ORDER — DEXAMETHASONE SODIUM PHOSPHATE 10 MG/ML IJ SOLN
10.0000 mg | Freq: Once | INTRAMUSCULAR | Status: AC
  Administered 2014-02-02: 10 mg via INTRAVENOUS

## 2014-02-02 MED ORDER — SODIUM CHLORIDE 0.9 % IV SOLN
3.5000 mg | Freq: Once | INTRAVENOUS | Status: AC
  Administered 2014-02-02: 3.5 mg via INTRAVENOUS
  Filled 2014-02-02: qty 4.38

## 2014-02-02 MED ORDER — ONDANSETRON 8 MG/50ML IVPB (CHCC)
8.0000 mg | Freq: Once | INTRAVENOUS | Status: AC
  Administered 2014-02-02: 8 mg via INTRAVENOUS

## 2014-02-02 MED ORDER — DEXTROSE 5 % IV SOLN: 56.0000 mg/m2 | Freq: Once | INTRAVENOUS | Status: DC

## 2014-02-02 MED ORDER — SODIUM CHLORIDE 0.9 % IV SOLN
Freq: Once | INTRAVENOUS | Status: AC
  Administered 2014-02-02: 11:00:00 via INTRAVENOUS

## 2014-02-02 MED ORDER — HEPARIN SOD (PORK) LOCK FLUSH 100 UNIT/ML IV SOLN
500.0000 [IU] | Freq: Once | INTRAVENOUS | Status: AC | PRN
Start: 2014-02-02 — End: 2014-02-02
  Administered 2014-02-02: 500 [IU]
  Filled 2014-02-02: qty 5

## 2014-02-02 MED ORDER — DEXTROSE 5 % IV SOLN
120.0000 mg | Freq: Once | INTRAVENOUS | Status: AC
  Administered 2014-02-02: 120 mg via INTRAVENOUS
  Filled 2014-02-02: qty 60

## 2014-02-02 MED ORDER — SODIUM CHLORIDE 0.9 % IJ SOLN
10.0000 mL | INTRAMUSCULAR | Status: DC | PRN
  Administered 2014-02-02: 10 mL
  Filled 2014-02-02: qty 10

## 2014-02-02 MED ORDER — DEXAMETHASONE SODIUM PHOSPHATE 10 MG/ML IJ SOLN
INTRAMUSCULAR | Status: AC
  Filled 2014-02-02: qty 1

## 2014-02-02 NOTE — Patient Instructions (Signed)
Carfilzomib injection What is this medicine? CARFILZOMIB (kar FILZ oh mib) is a chemotherapy drug that works by slowing or stopping cancer cell growth. This medicine is used to treat multiple myeloma. This medicine may be used for other purposes; ask your health care provider or pharmacist if you have questions. COMMON BRAND NAME(S): KYPROLIS What should I tell my health care provider before I take this medicine? They need to know if you have any of these conditions: -heart disease -irregular heartbeat -liver disease -lung or breathing disease -an unusual or allergic reaction to carfilzomib, or other medicines, foods, dyes, or preservatives -pregnant or trying to get pregnant -breast-feeding How should I use this medicine? This medicine is for injection or infusion into a vein. It is given by a health care professional in a hospital or clinic setting. Talk to your pediatrician regarding the use of this medicine in children. Special care may be needed. Overdosage: If you think you've taken too much of this medicine contact a poison control center or emergency room at once. Overdosage: If you think you have taken too much of this medicine contact a poison control center or emergency room at once. NOTE: This medicine is only for you. Do not share this medicine with others. What if I miss a dose? It is important not to miss your dose. Call your doctor or health care professional if you are unable to keep an appointment. What may interact with this medicine? Interactions are not expected. Give your health care provider a list of all the medicines, herbs, non-prescription drugs, or dietary supplements you use. Also tell them if you smoke, drink alcohol, or use illegal drugs. Some items may interact with your medicine. This list may not describe all possible interactions. Give your health care provider a list of all the medicines, herbs, non-prescription drugs, or dietary supplements you use. Also  tell them if you smoke, drink alcohol, or use illegal drugs. Some items may interact with your medicine. What should I watch for while using this medicine? Your condition will be monitored carefully while you are receiving this medicine. Report any side effects. Continue your course of treatment even though you feel ill unless your doctor tells you to stop. Call your doctor or health care professional for advice if you get a fever, chills or sore throat, or other symptoms of a cold or flu. Do not treat yourself. Try to avoid being around people who are sick. Do not become pregnant while taking this medicine. Women should inform their doctor if they wish to become pregnant or think they might be pregnant. There is a potential for serious side effects to an unborn child. Talk to your health care professional or pharmacist for more information. Do not breast-feed an infant while taking this medicine. Check with your doctor or health care professional if you get an attack of severe diarrhea, nausea and vomiting, or if you sweat a lot. The loss of too much body fluid can make it dangerous for you to take this medicine. You may get dizzy. Do not drive, use machinery, or do anything that needs mental alertness until you know how this medicine affects you. Do not stand or sit up quickly, especially if you are an older patient. This reduces the risk of dizzy or fainting spells. What side effects may I notice from receiving this medicine? Side effects that you should report to your doctor or health care professional as soon as possible: -allergic reactions like skin rash, itching or hives,  swelling of the face, lips, or tongue -breathing problems -chest pain or palpitationschest tightness -cough -dark urine -dizziness -feeling faint or lightheaded -fever or chills -general ill feeling or flu-like symptoms -light-colored stools -palpitations -right upper belly pain -swelling of the legs or ankles -unusual  bleeding or bruising -unusually weak or tired -yellowing of the eyes or skin Side effects that usually do not require medical attention (Report these to your doctor or health care professional if they continue or are bothersome.): -diarrhea -headache -nausea, vomiting -tiredness This list may not describe all possible side effects. Call your doctor for medical advice about side effects. You may report side effects to FDA at 1-800-FDA-1088. Where should I keep my medicine? This drug is given in a hospital or clinic and will not be stored at home. NOTE: This sheet is a summary. It may not cover all possible information. If you have questions about this medicine, talk to your doctor, pharmacist, or health care provider.  2015, [employer]. (2012-01-22 17:02:29) Zoledronic Acid injection (Hypercalcemia, Oncology) What is this medicine? ZOLEDRONIC ACID (ZOE le dron ik AS id) lowers the amount of calcium loss from bone. It is used to treat too much calcium in your blood from cancer. It is also used to prevent complications of cancer that has spread to the bone. This medicine may be used for other purposes; ask your health care provider or pharmacist if you have questions. COMMON BRAND NAME(S): Zometa What should I tell my health care provider before I take this medicine? They need to know if you have any of these conditions: -aspirin-sensitive asthma -cancer, especially if you are receiving medicines used to treat cancer -dental disease or wear dentures -infection -kidney disease -receiving corticosteroids like dexamethasone or prednisone -an unusual or allergic reaction to zoledronic acid, other medicines, foods, dyes, or preservatives -pregnant or trying to get pregnant -breast-feeding How should I use this medicine? This medicine is for infusion into a vein. It is given by a health care professional in a hospital or clinic setting. Talk to your pediatrician regarding the use  of this medicine in children. Special care may be needed. Overdosage: If you think you have taken too much of this medicine contact a poison control center or emergency room at once. NOTE: This medicine is only for you. Do not share this medicine with others. What if I miss a dose? It is important not to miss your dose. Call your doctor or health care professional if you are unable to keep an appointment. What may interact with this medicine? -certain antibiotics given by injection -NSAIDs, medicines for pain and inflammation, like ibuprofen or naproxen -some diuretics like bumetanide, furosemide -teriparatide -thalidomide This list may not describe all possible interactions. Give your health care provider a list of all the medicines, herbs, non-prescription drugs, or dietary supplements you use. Also tell them if you smoke, drink alcohol, or use illegal drugs. Some items may interact with your medicine. What should I watch for while using this medicine? Visit your doctor or health care professional for regular checkups. It may be some time before you see the benefit from this medicine. Do not stop taking your medicine unless your doctor tells you to. Your doctor may order blood tests or other tests to see how you are doing. Women should inform their doctor if they wish to become pregnant or think they might be pregnant. There is a potential for serious side effects to an unborn child. Talk to your health care professional or  pharmacist for more information. You should make sure that you get enough calcium and vitamin D while you are taking this medicine. Discuss the foods you eat and the vitamins you take with your health care professional. Some people who take this medicine have severe bone, joint, and/or muscle pain. This medicine may also increase your risk for jaw problems or a broken thigh bone. Tell your doctor right away if you have severe pain in your jaw, bones, joints, or muscles. Tell  your doctor if you have any pain that does not go away or that gets worse. Tell your dentist and dental surgeon that you are taking this medicine. You should not have major dental surgery while on this medicine. See your dentist to have a dental exam and fix any dental problems before starting this medicine. Take good care of your teeth while on this medicine. Make sure you see your dentist for regular follow-up appointments. What side effects may I notice from receiving this medicine? Side effects that you should report to your doctor or health care professional as soon as possible: -allergic reactions like skin rash, itching or hives, swelling of the face, lips, or tongue -anxiety, confusion, or depression -breathing problems -changes in vision -eye pain -feeling faint or lightheaded, falls -jaw pain, especially after dental work -mouth sores -muscle cramps, stiffness, or weakness -trouble passing urine or change in the amount of urine Side effects that usually do not require medical attention (report to your doctor or health care professional if they continue or are bothersome): -bone, joint, or muscle pain -constipation -diarrhea -fever -hair loss -irritation at site where injected -loss of appetite -nausea, vomiting -stomach upset -trouble sleeping -trouble swallowing -weak or tired This list may not describe all possible side effects. Call your doctor for medical advice about side effects. You may report side effects to FDA at 1-800-FDA-1088. Where should I keep my medicine? This drug is given in a hospital or clinic and will not be stored at home. NOTE: This sheet is a summary. It may not cover all possible information. If you have questions about this medicine, talk to your doctor, pharmacist, or health care provider.  2015, [employer]. (2013-01-12 13:03:13)

## 2014-02-08 ENCOUNTER — Ambulatory Visit: Payer: Medicare HMO

## 2014-02-08 ENCOUNTER — Other Ambulatory Visit: Payer: Medicare HMO | Admitting: Lab

## 2014-02-09 ENCOUNTER — Ambulatory Visit: Payer: Medicare HMO

## 2014-02-09 ENCOUNTER — Telehealth: Payer: Self-pay | Admitting: *Deleted

## 2014-02-09 NOTE — Telephone Encounter (Signed)
Pt called saying he had been reading up on the side effects of the new study drug Dr. Melba Coon wants to start him on but he is not sure he wants to take it because of the side effects.  Per Dr. Marin Olp, Velcade is reducing his light chains slowly and maybe he could also use Aranesp for the red blood cells.   Dr. Marin Olp had thought the study drug could be something that could help Arthur Valdez if he wanted to try it.  Arthur Valdez would like to stay on the Velcade for now and add the Aranesp.

## 2014-02-12 ENCOUNTER — Other Ambulatory Visit: Payer: Self-pay | Admitting: *Deleted

## 2014-02-12 DIAGNOSIS — C9002 Multiple myeloma in relapse: Secondary | ICD-10-CM

## 2014-02-13 ENCOUNTER — Other Ambulatory Visit: Payer: Self-pay | Admitting: *Deleted

## 2014-02-13 DIAGNOSIS — C9002 Multiple myeloma in relapse: Secondary | ICD-10-CM

## 2014-02-14 ENCOUNTER — Other Ambulatory Visit: Payer: Self-pay | Admitting: *Deleted

## 2014-02-14 ENCOUNTER — Ambulatory Visit (HOSPITAL_BASED_OUTPATIENT_CLINIC_OR_DEPARTMENT_OTHER): Payer: Medicare HMO

## 2014-02-14 ENCOUNTER — Ambulatory Visit (HOSPITAL_BASED_OUTPATIENT_CLINIC_OR_DEPARTMENT_OTHER): Payer: Medicare HMO | Admitting: Lab

## 2014-02-14 DIAGNOSIS — C9002 Multiple myeloma in relapse: Secondary | ICD-10-CM

## 2014-02-14 DIAGNOSIS — D631 Anemia in chronic kidney disease: Secondary | ICD-10-CM

## 2014-02-14 DIAGNOSIS — N189 Chronic kidney disease, unspecified: Secondary | ICD-10-CM

## 2014-02-14 DIAGNOSIS — N039 Chronic nephritic syndrome with unspecified morphologic changes: Secondary | ICD-10-CM

## 2014-02-14 DIAGNOSIS — Z5112 Encounter for antineoplastic immunotherapy: Secondary | ICD-10-CM

## 2014-02-14 LAB — CBC WITH DIFFERENTIAL (CANCER CENTER ONLY)
BASO#: 0 10*3/uL (ref 0.0–0.2)
BASO%: 0.3 % (ref 0.0–2.0)
EOS%: 1.8 % (ref 0.0–7.0)
Eosinophils Absolute: 0.1 10*3/uL (ref 0.0–0.5)
HCT: 30.2 % — ABNORMAL LOW (ref 38.7–49.9)
HEMOGLOBIN: 9.9 g/dL — AB (ref 13.0–17.1)
LYMPH#: 0.9 10*3/uL (ref 0.9–3.3)
LYMPH%: 22.5 % (ref 14.0–48.0)
MCH: 30.2 pg (ref 28.0–33.4)
MCHC: 32.8 g/dL (ref 32.0–35.9)
MCV: 92 fL (ref 82–98)
MONO#: 0.7 10*3/uL (ref 0.1–0.9)
MONO%: 18.2 % — ABNORMAL HIGH (ref 0.0–13.0)
NEUT%: 57.2 % (ref 40.0–80.0)
NEUTROS ABS: 2.3 10*3/uL (ref 1.5–6.5)
Platelets: 187 10*3/uL (ref 145–400)
RBC: 3.28 10*6/uL — ABNORMAL LOW (ref 4.20–5.70)
RDW: 17.8 % — ABNORMAL HIGH (ref 11.1–15.7)
WBC: 4 10*3/uL (ref 4.0–10.0)

## 2014-02-14 LAB — CMP (CANCER CENTER ONLY)
ALK PHOS: 62 U/L (ref 26–84)
ALT: 12 U/L (ref 10–47)
AST: 21 U/L (ref 11–38)
Albumin: 3.2 g/dL — ABNORMAL LOW (ref 3.3–5.5)
BILIRUBIN TOTAL: 0.7 mg/dL (ref 0.20–1.60)
BUN, Bld: 15 mg/dL (ref 7–22)
CO2: 25 mEq/L (ref 18–33)
Calcium: 8.3 mg/dL (ref 8.0–10.3)
Chloride: 105 mEq/L (ref 98–108)
Creat: 1.4 mg/dl — ABNORMAL HIGH (ref 0.6–1.2)
GLUCOSE: 124 mg/dL — AB (ref 73–118)
Potassium: 3.7 mEq/L (ref 3.3–4.7)
SODIUM: 140 meq/L (ref 128–145)
Total Protein: 5.9 g/dL — ABNORMAL LOW (ref 6.4–8.1)

## 2014-02-14 LAB — TECHNOLOGIST REVIEW CHCC SATELLITE

## 2014-02-14 MED ORDER — ONDANSETRON 8 MG/50ML IVPB (CHCC)
8.0000 mg | Freq: Once | INTRAVENOUS | Status: AC
  Administered 2014-02-14: 8 mg via INTRAVENOUS

## 2014-02-14 MED ORDER — DEXAMETHASONE SODIUM PHOSPHATE 10 MG/ML IJ SOLN
10.0000 mg | Freq: Once | INTRAMUSCULAR | Status: AC
  Administered 2014-02-14: 10 mg via INTRAVENOUS

## 2014-02-14 MED ORDER — DARBEPOETIN ALFA-POLYSORBATE 300 MCG/0.6ML IJ SOLN
300.0000 ug | Freq: Once | INTRAMUSCULAR | Status: AC
  Administered 2014-02-14: 300 ug via SUBCUTANEOUS

## 2014-02-14 MED ORDER — SODIUM CHLORIDE 0.9 % IV SOLN
Freq: Once | INTRAVENOUS | Status: AC
Start: 2014-02-14 — End: 2014-02-14
  Administered 2014-02-14: 11:00:00 via INTRAVENOUS

## 2014-02-14 MED ORDER — SODIUM CHLORIDE 0.9 % IJ SOLN
10.0000 mL | INTRAMUSCULAR | Status: DC | PRN
  Administered 2014-02-14: 10 mL
  Filled 2014-02-14: qty 10

## 2014-02-14 MED ORDER — HEPARIN SOD (PORK) LOCK FLUSH 100 UNIT/ML IV SOLN
500.0000 [IU] | Freq: Once | INTRAVENOUS | Status: AC | PRN
  Administered 2014-02-14: 500 [IU]
  Filled 2014-02-14: qty 5

## 2014-02-14 MED ORDER — DARBEPOETIN ALFA-POLYSORBATE 300 MCG/0.6ML IJ SOLN
INTRAMUSCULAR | Status: AC
  Filled 2014-02-14: qty 0.6

## 2014-02-14 MED ORDER — DEXAMETHASONE SODIUM PHOSPHATE 10 MG/ML IJ SOLN
INTRAMUSCULAR | Status: AC
  Filled 2014-02-14: qty 1

## 2014-02-14 MED ORDER — DEXTROSE 5 % IV SOLN
56.0000 mg/m2 | Freq: Once | INTRAVENOUS | Status: AC
  Administered 2014-02-14: 120 mg via INTRAVENOUS
  Filled 2014-02-14: qty 60

## 2014-02-14 NOTE — Patient Instructions (Signed)
Darbepoetin Alfa injection What is this medicine? DARBEPOETIN ALFA (dar be POE e tin AL fa) helps your body make more red blood cells. It is used to treat anemia caused by chronic kidney failure and chemotherapy. This medicine may be used for other purposes; ask your health care provider or pharmacist if you have questions. COMMON BRAND NAME(S): Aranesp What should I tell my health care provider before I take this medicine? They need to know if you have any of these conditions: -blood clotting disorders or history of blood clots -cancer patient not on chemotherapy -cystic fibrosis -heart disease, such as angina, heart failure, or a history of a heart attack -hemoglobin level of 12 g/dL or greater -high blood pressure -low levels of folate, iron, or vitamin B12 -seizures -an unusual or allergic reaction to darbepoetin, erythropoietin, albumin, hamster proteins, latex, other medicines, foods, dyes, or preservatives -pregnant or trying to get pregnant -breast-feeding How should I use this medicine? This medicine is for injection into a vein or under the skin. It is usually given by a health care professional in a hospital or clinic setting. If you get this medicine at home, you will be taught how to prepare and give this medicine. Do not shake the solution before you withdraw a dose. Use exactly as directed. Take your medicine at regular intervals. Do not take your medicine more often than directed. It is important that you put your used needles and syringes in a special sharps container. Do not put them in a trash can. If you do not have a sharps container, call your pharmacist or healthcare provider to get one. Talk to your pediatrician regarding the use of this medicine in children. While this medicine may be used in children as young as 1 year for selected conditions, precautions do apply. Overdosage: If you think you have taken too much of this medicine contact a poison control center or  emergency room at once. NOTE: This medicine is only for you. Do not share this medicine with others. What if I miss a dose? If you miss a dose, take it as soon as you can. If it is almost time for your next dose, take only that dose. Do not take double or extra doses. What may interact with this medicine? Do not take this medicine with any of the following medications: -epoetin alfa This list may not describe all possible interactions. Give your health care provider a list of all the medicines, herbs, non-prescription drugs, or dietary supplements you use. Also tell them if you smoke, drink alcohol, or use illegal drugs. Some items may interact with your medicine. What should I watch for while using this medicine? Visit your prescriber or health care professional for regular checks on your progress and for the needed blood tests and blood pressure measurements. It is especially important for the doctor to make sure your hemoglobin level is in the desired range, to limit the risk of potential side effects and to give you the best benefit. Keep all appointments for any recommended tests. Check your blood pressure as directed. Ask your doctor what your blood pressure should be and when you should contact him or her. As your body makes more red blood cells, you may need to take iron, folic acid, or vitamin B supplements. Ask your doctor or health care provider which products are right for you. If you have kidney disease continue dietary restrictions, even though this medication can make you feel better. Talk with your doctor or health   care professional about the foods you eat and the vitamins that you take. What side effects may I notice from receiving this medicine? Side effects that you should report to your doctor or health care professional as soon as possible: -allergic reactions like skin rash, itching or hives, swelling of the face, lips, or tongue -breathing problems -changes in vision -chest  pain -confusion, trouble speaking or understanding -feeling faint or lightheaded, falls -high blood pressure -muscle aches or pains -pain, swelling, warmth in the leg -rapid weight gain -severe headaches -sudden numbness or weakness of the face, arm or leg -trouble walking, dizziness, loss of balance or coordination -seizures (convulsions) -swelling of the ankles, feet, hands -unusually weak or tired Side effects that usually do not require medical attention (report to your doctor or health care professional if they continue or are bothersome): -diarrhea -fever, chills (flu-like symptoms) -headaches -nausea, vomiting -redness, stinging, or swelling at site where injected This list may not describe all possible side effects. Call your doctor for medical advice about side effects. You may report side effects to FDA at 1-800-FDA-1088. Where should I keep my medicine? Keep out of the reach of children. Store in a refrigerator between 2 and 8 degrees C (36 and 46 degrees F). Do not freeze. Do not shake. Throw away any unused portion if using a single-dose vial. Throw away any unused medicine after the expiration date. NOTE: This sheet is a summary. It may not cover all possible information. If you have questions about this medicine, talk to your doctor, pharmacist, or health care provider.  2015, Elsevier/Gold Standard. (2008-07-17 10:23:57) Carfilzomib injection What is this medicine? CARFILZOMIB (kar FILZ oh mib) is a chemotherapy drug that works by slowing or stopping cancer cell growth. This medicine is used to treat multiple myeloma. This medicine may be used for other purposes; ask your health care provider or pharmacist if you have questions. COMMON BRAND NAME(S): KYPROLIS What should I tell my health care provider before I take this medicine? They need to know if you have any of these conditions: -heart disease -irregular heartbeat -liver disease -lung or breathing disease -an  unusual or allergic reaction to carfilzomib, or other medicines, foods, dyes, or preservatives -pregnant or trying to get pregnant -breast-feeding How should I use this medicine? This medicine is for injection or infusion into a vein. It is given by a health care professional in a hospital or clinic setting. Talk to your pediatrician regarding the use of this medicine in children. Special care may be needed. Overdosage: If you think you've taken too much of this medicine contact a poison control center or emergency room at once. Overdosage: If you think you have taken too much of this medicine contact a poison control center or emergency room at once. NOTE: This medicine is only for you. Do not share this medicine with others. What if I miss a dose? It is important not to miss your dose. Call your doctor or health care professional if you are unable to keep an appointment. What may interact with this medicine? Interactions are not expected. Give your health care provider a list of all the medicines, herbs, non-prescription drugs, or dietary supplements you use. Also tell them if you smoke, drink alcohol, or use illegal drugs. Some items may interact with your medicine. This list may not describe all possible interactions. Give your health care provider a list of all the medicines, herbs, non-prescription drugs, or dietary supplements you use. Also tell them if you smoke,  drink alcohol, or use illegal drugs. Some items may interact with your medicine. What should I watch for while using this medicine? Your condition will be monitored carefully while you are receiving this medicine. Report any side effects. Continue your course of treatment even though you feel ill unless your doctor tells you to stop. Call your doctor or health care professional for advice if you get a fever, chills or sore throat, or other symptoms of a cold or flu. Do not treat yourself. Try to avoid being around people who are  sick. Do not become pregnant while taking this medicine. Women should inform their doctor if they wish to become pregnant or think they might be pregnant. There is a potential for serious side effects to an unborn child. Talk to your health care professional or pharmacist for more information. Do not breast-feed an infant while taking this medicine. Check with your doctor or health care professional if you get an attack of severe diarrhea, nausea and vomiting, or if you sweat a lot. The loss of too much body fluid can make it dangerous for you to take this medicine. You may get dizzy. Do not drive, use machinery, or do anything that needs mental alertness until you know how this medicine affects you. Do not stand or sit up quickly, especially if you are an older patient. This reduces the risk of dizzy or fainting spells. What side effects may I notice from receiving this medicine? Side effects that you should report to your doctor or health care professional as soon as possible: -allergic reactions like skin rash, itching or hives, swelling of the face, lips, or tongue -breathing problems -chest pain or palpitationschest tightness -cough -dark urine -dizziness -feeling faint or lightheaded -fever or chills -general ill feeling or flu-like symptoms -light-colored stools -palpitations -right upper belly pain -swelling of the legs or ankles -unusual bleeding or bruising -unusually weak or tired -yellowing of the eyes or skin Side effects that usually do not require medical attention (Report these to your doctor or health care professional if they continue or are bothersome.): -diarrhea -headache -nausea, vomiting -tiredness This list may not describe all possible side effects. Call your doctor for medical advice about side effects. You may report side effects to FDA at 1-800-FDA-1088. Where should I keep my medicine? This drug is given in a hospital or clinic and will not be stored at  home. NOTE: This sheet is a summary. It may not cover all possible information. If you have questions about this medicine, talk to your doctor, pharmacist, or health care provider.  2015, Elsevier/Gold Standard. (2012-01-22 17:02:29)

## 2014-02-15 ENCOUNTER — Encounter: Payer: Self-pay | Admitting: Hematology & Oncology

## 2014-02-15 ENCOUNTER — Ambulatory Visit (HOSPITAL_BASED_OUTPATIENT_CLINIC_OR_DEPARTMENT_OTHER): Payer: Medicare HMO

## 2014-02-15 VITALS — BP 135/72 | HR 109 | Temp 97.2°F | Resp 16

## 2014-02-15 DIAGNOSIS — Z5112 Encounter for antineoplastic immunotherapy: Secondary | ICD-10-CM

## 2014-02-15 DIAGNOSIS — C9002 Multiple myeloma in relapse: Secondary | ICD-10-CM

## 2014-02-15 MED ORDER — DEXAMETHASONE SODIUM PHOSPHATE 10 MG/ML IJ SOLN
INTRAMUSCULAR | Status: AC
  Filled 2014-02-15: qty 1

## 2014-02-15 MED ORDER — HEPARIN SOD (PORK) LOCK FLUSH 100 UNIT/ML IV SOLN
500.0000 [IU] | Freq: Once | INTRAVENOUS | Status: AC | PRN
  Administered 2014-02-15: 500 [IU]
  Filled 2014-02-15: qty 5

## 2014-02-15 MED ORDER — SODIUM CHLORIDE 0.9 % IJ SOLN
10.0000 mL | INTRAMUSCULAR | Status: DC | PRN
  Administered 2014-02-15: 10 mL
  Filled 2014-02-15: qty 10

## 2014-02-15 MED ORDER — DEXTROSE 5 % IV SOLN
56.0000 mg/m2 | Freq: Once | INTRAVENOUS | Status: AC
  Administered 2014-02-15: 120 mg via INTRAVENOUS
  Filled 2014-02-15: qty 60

## 2014-02-15 MED ORDER — DEXAMETHASONE SODIUM PHOSPHATE 10 MG/ML IJ SOLN
10.0000 mg | Freq: Once | INTRAMUSCULAR | Status: AC
  Administered 2014-02-15: 10 mg via INTRAVENOUS

## 2014-02-15 MED ORDER — SODIUM CHLORIDE 0.9 % IJ SOLN
3.0000 mL | INTRAMUSCULAR | Status: DC | PRN
  Filled 2014-02-15: qty 10

## 2014-02-15 MED ORDER — HEPARIN SOD (PORK) LOCK FLUSH 100 UNIT/ML IV SOLN
250.0000 [IU] | Freq: Once | INTRAVENOUS | Status: DC | PRN
  Filled 2014-02-15: qty 5

## 2014-02-15 MED ORDER — SODIUM CHLORIDE 0.9 % IV SOLN: Freq: Once | INTRAVENOUS | Status: DC

## 2014-02-15 MED ORDER — ALTEPLASE 2 MG IJ SOLR
2.0000 mg | Freq: Once | INTRAMUSCULAR | Status: DC | PRN
  Filled 2014-02-15: qty 2

## 2014-02-15 MED ORDER — ONDANSETRON 8 MG/50ML IVPB (CHCC)
8.0000 mg | Freq: Once | INTRAVENOUS | Status: AC
  Administered 2014-02-15: 8 mg via INTRAVENOUS

## 2014-02-15 NOTE — Patient Instructions (Signed)
Carfilzomib injection What is this medicine? CARFILZOMIB (kar FILZ oh mib) is a chemotherapy drug that works by slowing or stopping cancer cell growth. This medicine is used to treat multiple myeloma. This medicine may be used for other purposes; ask your health care provider or pharmacist if you have questions. COMMON BRAND NAME(S): KYPROLIS What should I tell my health care provider before I take this medicine? They need to know if you have any of these conditions: -heart disease -irregular heartbeat -liver disease -lung or breathing disease -an unusual or allergic reaction to carfilzomib, or other medicines, foods, dyes, or preservatives -pregnant or trying to get pregnant -breast-feeding How should I use this medicine? This medicine is for injection or infusion into a vein. It is given by a health care professional in a hospital or clinic setting. Talk to your pediatrician regarding the use of this medicine in children. Special care may be needed. Overdosage: If you think you've taken too much of this medicine contact a poison control center or emergency room at once. Overdosage: If you think you have taken too much of this medicine contact a poison control center or emergency room at once. NOTE: This medicine is only for you. Do not share this medicine with others. What if I miss a dose? It is important not to miss your dose. Call your doctor or health care professional if you are unable to keep an appointment. What may interact with this medicine? Interactions are not expected. Give your health care provider a list of all the medicines, herbs, non-prescription drugs, or dietary supplements you use. Also tell them if you smoke, drink alcohol, or use illegal drugs. Some items may interact with your medicine. This list may not describe all possible interactions. Give your health care provider a list of all the medicines, herbs, non-prescription drugs, or dietary supplements you use. Also  tell them if you smoke, drink alcohol, or use illegal drugs. Some items may interact with your medicine. What should I watch for while using this medicine? Your condition will be monitored carefully while you are receiving this medicine. Report any side effects. Continue your course of treatment even though you feel ill unless your doctor tells you to stop. Call your doctor or health care professional for advice if you get a fever, chills or sore throat, or other symptoms of a cold or flu. Do not treat yourself. Try to avoid being around people who are sick. Do not become pregnant while taking this medicine. Women should inform their doctor if they wish to become pregnant or think they might be pregnant. There is a potential for serious side effects to an unborn child. Talk to your health care professional or pharmacist for more information. Do not breast-feed an infant while taking this medicine. Check with your doctor or health care professional if you get an attack of severe diarrhea, nausea and vomiting, or if you sweat a lot. The loss of too much body fluid can make it dangerous for you to take this medicine. You may get dizzy. Do not drive, use machinery, or do anything that needs mental alertness until you know how this medicine affects you. Do not stand or sit up quickly, especially if you are an older patient. This reduces the risk of dizzy or fainting spells. What side effects may I notice from receiving this medicine? Side effects that you should report to your doctor or health care professional as soon as possible: -allergic reactions like skin rash, itching or hives,  swelling of the face, lips, or tongue -breathing problems -chest pain or palpitationschest tightness -cough -dark urine -dizziness -feeling faint or lightheaded -fever or chills -general ill feeling or flu-like symptoms -light-colored stools -palpitations -right upper belly pain -swelling of the legs or ankles -unusual  bleeding or bruising -unusually weak or tired -yellowing of the eyes or skin Side effects that usually do not require medical attention (Report these to your doctor or health care professional if they continue or are bothersome.): -diarrhea -headache -nausea, vomiting -tiredness This list may not describe all possible side effects. Call your doctor for medical advice about side effects. You may report side effects to FDA at 1-800-FDA-1088. Where should I keep my medicine? This drug is given in a hospital or clinic and will not be stored at home. NOTE: This sheet is a summary. It may not cover all possible information. If you have questions about this medicine, talk to your doctor, pharmacist, or health care provider.  2015, [employer]. (2012-01-22 17:02:29)

## 2014-02-19 LAB — PROTEIN ELECTROPHORESIS, SERUM, WITH REFLEX
Albumin ELP: 57.2 % (ref 55.8–66.1)
Alpha-1-Globulin: 10.7 % — ABNORMAL HIGH (ref 2.9–4.9)
Alpha-2-Globulin: 14.2 % — ABNORMAL HIGH (ref 7.1–11.8)
Beta 2: 5 % (ref 3.2–6.5)
Beta Globulin: 8.6 % — ABNORMAL HIGH (ref 4.7–7.2)
GAMMA GLOBULIN: 4.3 % — AB (ref 11.1–18.8)
M-SPIKE, %: 0.16 g/dL
Total Protein, Serum Electrophoresis: 5.7 g/dL — ABNORMAL LOW (ref 6.0–8.3)

## 2014-02-19 LAB — KAPPA/LAMBDA LIGHT CHAINS
KAPPA LAMBDA RATIO: 0 — AB (ref 0.26–1.65)
Kappa free light chain: 0.14 mg/dL — ABNORMAL LOW (ref 0.33–1.94)
Lambda Free Lght Chn: 388 mg/dL — ABNORMAL HIGH (ref 0.57–2.63)

## 2014-02-19 LAB — IGG, IGA, IGM
IGA: 9 mg/dL — AB (ref 68–379)
IGG (IMMUNOGLOBIN G), SERUM: 235 mg/dL — AB (ref 650–1600)

## 2014-02-19 LAB — IFE INTERPRETATION

## 2014-02-22 ENCOUNTER — Other Ambulatory Visit: Payer: Self-pay | Admitting: *Deleted

## 2014-02-22 ENCOUNTER — Other Ambulatory Visit (HOSPITAL_BASED_OUTPATIENT_CLINIC_OR_DEPARTMENT_OTHER): Payer: Medicare HMO | Admitting: Lab

## 2014-02-22 ENCOUNTER — Ambulatory Visit (HOSPITAL_BASED_OUTPATIENT_CLINIC_OR_DEPARTMENT_OTHER): Payer: Medicare HMO

## 2014-02-22 VITALS — BP 140/76 | HR 92 | Temp 97.4°F | Resp 16

## 2014-02-22 DIAGNOSIS — C9002 Multiple myeloma in relapse: Secondary | ICD-10-CM

## 2014-02-22 DIAGNOSIS — Z5112 Encounter for antineoplastic immunotherapy: Secondary | ICD-10-CM

## 2014-02-22 LAB — CBC WITH DIFFERENTIAL (CANCER CENTER ONLY)
BASO#: 0 10*3/uL (ref 0.0–0.2)
BASO%: 0.1 % (ref 0.0–2.0)
EOS%: 1.8 % (ref 0.0–7.0)
Eosinophils Absolute: 0.1 10*3/uL (ref 0.0–0.5)
HCT: 29 % — ABNORMAL LOW (ref 38.7–49.9)
HGB: 9.4 g/dL — ABNORMAL LOW (ref 13.0–17.1)
LYMPH#: 1 10*3/uL (ref 0.9–3.3)
LYMPH%: 14.4 % (ref 14.0–48.0)
MCH: 30.2 pg (ref 28.0–33.4)
MCHC: 32.4 g/dL (ref 32.0–35.9)
MCV: 93 fL (ref 82–98)
MONO#: 1.2 10*3/uL — ABNORMAL HIGH (ref 0.1–0.9)
MONO%: 16.5 % — ABNORMAL HIGH (ref 0.0–13.0)
NEUT%: 67.2 % (ref 40.0–80.0)
NEUTROS ABS: 4.7 10*3/uL (ref 1.5–6.5)
PLATELETS: 134 10*3/uL — AB (ref 145–400)
RBC: 3.11 10*6/uL — ABNORMAL LOW (ref 4.20–5.70)
RDW: 18.8 % — AB (ref 11.1–15.7)
WBC: 7.1 10*3/uL (ref 4.0–10.0)

## 2014-02-22 LAB — TECHNOLOGIST REVIEW CHCC SATELLITE

## 2014-02-22 LAB — HOLD TUBE, BLOOD BANK - CHCC SATELLITE

## 2014-02-22 MED ORDER — DEXTROSE 5 % IV SOLN: 56.0000 mg/m2 | Freq: Once | INTRAVENOUS | Status: DC

## 2014-02-22 MED ORDER — SODIUM CHLORIDE 0.9 % IV SOLN: Freq: Once | INTRAVENOUS | Status: DC

## 2014-02-22 MED ORDER — ONDANSETRON 8 MG/50ML IVPB (CHCC)
8.0000 mg | Freq: Once | INTRAVENOUS | Status: AC
  Administered 2014-02-22: 8 mg via INTRAVENOUS

## 2014-02-22 MED ORDER — SODIUM CHLORIDE 0.9 % IV SOLN
Freq: Once | INTRAVENOUS | Status: AC
  Administered 2014-02-22: 10:00:00 via INTRAVENOUS

## 2014-02-22 MED ORDER — HEPARIN SOD (PORK) LOCK FLUSH 100 UNIT/ML IV SOLN
500.0000 [IU] | Freq: Once | INTRAVENOUS | Status: AC | PRN
  Administered 2014-02-22: 500 [IU]
  Filled 2014-02-22: qty 5

## 2014-02-22 MED ORDER — HYDROMORPHONE HCL 2 MG PO TABS: 2.0000 mg | ORAL_TABLET | Freq: Four times a day (QID) | ORAL | Status: DC | PRN

## 2014-02-22 MED ORDER — DEXAMETHASONE SODIUM PHOSPHATE 10 MG/ML IJ SOLN
10.0000 mg | Freq: Once | INTRAMUSCULAR | Status: AC
  Administered 2014-02-22: 10 mg via INTRAVENOUS

## 2014-02-22 MED ORDER — SODIUM CHLORIDE 0.9 % IJ SOLN
10.0000 mL | INTRAMUSCULAR | Status: DC | PRN
  Administered 2014-02-22: 10 mL
  Filled 2014-02-22: qty 10

## 2014-02-22 MED ORDER — DEXTROSE 5 % IV SOLN
56.0000 mg/m2 | Freq: Once | INTRAVENOUS | Status: AC
  Administered 2014-02-22: 120 mg via INTRAVENOUS
  Filled 2014-02-22: qty 60

## 2014-02-22 MED ORDER — DEXAMETHASONE SODIUM PHOSPHATE 10 MG/ML IJ SOLN
INTRAMUSCULAR | Status: AC
  Filled 2014-02-22: qty 1

## 2014-02-22 MED ORDER — HEPARIN SOD (PORK) LOCK FLUSH 100 UNIT/ML IV SOLN
250.0000 [IU] | Freq: Once | INTRAVENOUS | Status: DC | PRN
  Filled 2014-02-22: qty 5

## 2014-02-22 MED ORDER — SODIUM CHLORIDE 0.9 % IJ SOLN
3.0000 mL | INTRAMUSCULAR | Status: DC | PRN
  Filled 2014-02-22: qty 10

## 2014-02-22 MED ORDER — ALTEPLASE 2 MG IJ SOLR
2.0000 mg | Freq: Once | INTRAMUSCULAR | Status: DC | PRN
  Filled 2014-02-22: qty 2

## 2014-02-22 NOTE — Progress Notes (Signed)
Dr. Marin Olp notified of increasing pain, will speak with patient.

## 2014-02-22 NOTE — Patient Instructions (Signed)
Carfilzomib injection What is this medicine? CARFILZOMIB (kar FILZ oh mib) is a chemotherapy drug that works by slowing or stopping cancer cell growth. This medicine is used to treat multiple myeloma. This medicine may be used for other purposes; ask your health care provider or pharmacist if you have questions. COMMON BRAND NAME(S): KYPROLIS What should I tell my health care provider before I take this medicine? They need to know if you have any of these conditions: -heart disease -irregular heartbeat -liver disease -lung or breathing disease -an unusual or allergic reaction to carfilzomib, or other medicines, foods, dyes, or preservatives -pregnant or trying to get pregnant -breast-feeding How should I use this medicine? This medicine is for injection or infusion into a vein. It is given by a health care professional in a hospital or clinic setting. Talk to your pediatrician regarding the use of this medicine in children. Special care may be needed. Overdosage: If you think you've taken too much of this medicine contact a poison control center or emergency room at once. Overdosage: If you think you have taken too much of this medicine contact a poison control center or emergency room at once. NOTE: This medicine is only for you. Do not share this medicine with others. What if I miss a dose? It is important not to miss your dose. Call your doctor or health care professional if you are unable to keep an appointment. What may interact with this medicine? Interactions are not expected. Give your health care provider a list of all the medicines, herbs, non-prescription drugs, or dietary supplements you use. Also tell them if you smoke, drink alcohol, or use illegal drugs. Some items may interact with your medicine. This list may not describe all possible interactions. Give your health care provider a list of all the medicines, herbs, non-prescription drugs, or dietary supplements you use. Also  tell them if you smoke, drink alcohol, or use illegal drugs. Some items may interact with your medicine. What should I watch for while using this medicine? Your condition will be monitored carefully while you are receiving this medicine. Report any side effects. Continue your course of treatment even though you feel ill unless your doctor tells you to stop. Call your doctor or health care professional for advice if you get a fever, chills or sore throat, or other symptoms of a cold or flu. Do not treat yourself. Try to avoid being around people who are sick. Do not become pregnant while taking this medicine. Women should inform their doctor if they wish to become pregnant or think they might be pregnant. There is a potential for serious side effects to an unborn child. Talk to your health care professional or pharmacist for more information. Do not breast-feed an infant while taking this medicine. Check with your doctor or health care professional if you get an attack of severe diarrhea, nausea and vomiting, or if you sweat a lot. The loss of too much body fluid can make it dangerous for you to take this medicine. You may get dizzy. Do not drive, use machinery, or do anything that needs mental alertness until you know how this medicine affects you. Do not stand or sit up quickly, especially if you are an older patient. This reduces the risk of dizzy or fainting spells. What side effects may I notice from receiving this medicine? Side effects that you should report to your doctor or health care professional as soon as possible: -allergic reactions like skin rash, itching or hives,  swelling of the face, lips, or tongue -breathing problems -chest pain or palpitationschest tightness -cough -dark urine -dizziness -feeling faint or lightheaded -fever or chills -general ill feeling or flu-like symptoms -light-colored stools -palpitations -right upper belly pain -swelling of the legs or ankles -unusual  bleeding or bruising -unusually weak or tired -yellowing of the eyes or skin Side effects that usually do not require medical attention (Report these to your doctor or health care professional if they continue or are bothersome.): -diarrhea -headache -nausea, vomiting -tiredness This list may not describe all possible side effects. Call your doctor for medical advice about side effects. You may report side effects to FDA at 1-800-FDA-1088. Where should I keep my medicine? This drug is given in a hospital or clinic and will not be stored at home. NOTE: This sheet is a summary. It may not cover all possible information. If you have questions about this medicine, talk to your doctor, pharmacist, or health care provider.  2015, [employer]. (2012-01-22 17:02:29)

## 2014-02-23 ENCOUNTER — Ambulatory Visit (HOSPITAL_BASED_OUTPATIENT_CLINIC_OR_DEPARTMENT_OTHER): Payer: Medicare HMO

## 2014-02-23 VITALS — BP 137/74 | HR 100 | Temp 97.4°F | Resp 16

## 2014-02-23 DIAGNOSIS — Z5112 Encounter for antineoplastic immunotherapy: Secondary | ICD-10-CM

## 2014-02-23 DIAGNOSIS — C9002 Multiple myeloma in relapse: Secondary | ICD-10-CM

## 2014-02-23 MED ORDER — SODIUM CHLORIDE 0.9 % IV SOLN
Freq: Once | INTRAVENOUS | Status: AC
  Administered 2014-02-23: 08:00:00 via INTRAVENOUS

## 2014-02-23 MED ORDER — HEPARIN SOD (PORK) LOCK FLUSH 100 UNIT/ML IV SOLN
500.0000 [IU] | Freq: Once | INTRAVENOUS | Status: AC | PRN
  Administered 2014-02-23: 500 [IU]
  Filled 2014-02-23: qty 5

## 2014-02-23 MED ORDER — HEPARIN SOD (PORK) LOCK FLUSH 100 UNIT/ML IV SOLN
250.0000 [IU] | Freq: Once | INTRAVENOUS | Status: DC | PRN
  Filled 2014-02-23: qty 5

## 2014-02-23 MED ORDER — ONDANSETRON 8 MG/50ML IVPB (CHCC)
8.0000 mg | Freq: Once | INTRAVENOUS | Status: AC
  Administered 2014-02-23: 8 mg via INTRAVENOUS

## 2014-02-23 MED ORDER — SODIUM CHLORIDE 0.9 % IV SOLN: Freq: Once | INTRAVENOUS | Status: DC

## 2014-02-23 MED ORDER — DEXTROSE 5 % IV SOLN
56.0000 mg/m2 | Freq: Once | INTRAVENOUS | Status: AC
  Administered 2014-02-23: 120 mg via INTRAVENOUS
  Filled 2014-02-23: qty 60

## 2014-02-23 MED ORDER — DEXAMETHASONE SODIUM PHOSPHATE 10 MG/ML IJ SOLN
10.0000 mg | Freq: Once | INTRAMUSCULAR | Status: AC
  Administered 2014-02-23: 10 mg via INTRAVENOUS

## 2014-02-23 MED ORDER — SODIUM CHLORIDE 0.9 % IJ SOLN
10.0000 mL | INTRAMUSCULAR | Status: DC | PRN
  Administered 2014-02-23: 10 mL
  Filled 2014-02-23: qty 10

## 2014-02-23 MED ORDER — ALTEPLASE 2 MG IJ SOLR
2.0000 mg | Freq: Once | INTRAMUSCULAR | Status: DC | PRN
Start: 2014-02-23 — End: 2014-02-23
  Filled 2014-02-23: qty 2

## 2014-02-23 MED ORDER — DEXAMETHASONE SODIUM PHOSPHATE 10 MG/ML IJ SOLN
INTRAMUSCULAR | Status: AC
  Filled 2014-02-23: qty 1

## 2014-02-23 MED ORDER — SODIUM CHLORIDE 0.9 % IJ SOLN
3.0000 mL | INTRAMUSCULAR | Status: DC | PRN
  Filled 2014-02-23: qty 10

## 2014-02-23 NOTE — Patient Instructions (Signed)
Carfilzomib injection What is this medicine? CARFILZOMIB (kar FILZ oh mib) is a chemotherapy drug that works by slowing or stopping cancer cell growth. This medicine is used to treat multiple myeloma. This medicine may be used for other purposes; ask your health care provider or pharmacist if you have questions. COMMON BRAND NAME(S): KYPROLIS What should I tell my health care provider before I take this medicine? They need to know if you have any of these conditions: -heart disease -irregular heartbeat -liver disease -lung or breathing disease -an unusual or allergic reaction to carfilzomib, or other medicines, foods, dyes, or preservatives -pregnant or trying to get pregnant -breast-feeding How should I use this medicine? This medicine is for injection or infusion into a vein. It is given by a health care professional in a hospital or clinic setting. Talk to your pediatrician regarding the use of this medicine in children. Special care may be needed. Overdosage: If you think you've taken too much of this medicine contact a poison control center or emergency room at once. Overdosage: If you think you have taken too much of this medicine contact a poison control center or emergency room at once. NOTE: This medicine is only for you. Do not share this medicine with others. What if I miss a dose? It is important not to miss your dose. Call your doctor or health care professional if you are unable to keep an appointment. What may interact with this medicine? Interactions are not expected. Give your health care provider a list of all the medicines, herbs, non-prescription drugs, or dietary supplements you use. Also tell them if you smoke, drink alcohol, or use illegal drugs. Some items may interact with your medicine. This list may not describe all possible interactions. Give your health care provider a list of all the medicines, herbs, non-prescription drugs, or dietary supplements you use. Also  tell them if you smoke, drink alcohol, or use illegal drugs. Some items may interact with your medicine. What should I watch for while using this medicine? Your condition will be monitored carefully while you are receiving this medicine. Report any side effects. Continue your course of treatment even though you feel ill unless your doctor tells you to stop. Call your doctor or health care professional for advice if you get a fever, chills or sore throat, or other symptoms of a cold or flu. Do not treat yourself. Try to avoid being around people who are sick. Do not become pregnant while taking this medicine. Women should inform their doctor if they wish to become pregnant or think they might be pregnant. There is a potential for serious side effects to an unborn child. Talk to your health care professional or pharmacist for more information. Do not breast-feed an infant while taking this medicine. Check with your doctor or health care professional if you get an attack of severe diarrhea, nausea and vomiting, or if you sweat a lot. The loss of too much body fluid can make it dangerous for you to take this medicine. You may get dizzy. Do not drive, use machinery, or do anything that needs mental alertness until you know how this medicine affects you. Do not stand or sit up quickly, especially if you are an older patient. This reduces the risk of dizzy or fainting spells. What side effects may I notice from receiving this medicine? Side effects that you should report to your doctor or health care professional as soon as possible: -allergic reactions like skin rash, itching or hives,  swelling of the face, lips, or tongue -breathing problems -chest pain or palpitationschest tightness -cough -dark urine -dizziness -feeling faint or lightheaded -fever or chills -general ill feeling or flu-like symptoms -light-colored stools -palpitations -right upper belly pain -swelling of the legs or ankles -unusual  bleeding or bruising -unusually weak or tired -yellowing of the eyes or skin Side effects that usually do not require medical attention (Report these to your doctor or health care professional if they continue or are bothersome.): -diarrhea -headache -nausea, vomiting -tiredness This list may not describe all possible side effects. Call your doctor for medical advice about side effects. You may report side effects to FDA at 1-800-FDA-1088. Where should I keep my medicine? This drug is given in a hospital or clinic and will not be stored at home. NOTE: This sheet is a summary. It may not cover all possible information. If you have questions about this medicine, talk to your doctor, pharmacist, or health care provider.  2015, [employer]. (2012-01-22 17:02:29)

## 2014-02-23 NOTE — Progress Notes (Signed)
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 Walker? Wheelchair?:   SAFETY ISSUES:  Prior radiation?   Pacemaker/ICD?   Possible current pregnancy?   Is the patient on methotrexate?   Additional Complaints / other details:

## 2014-02-26 ENCOUNTER — Other Ambulatory Visit: Payer: Self-pay | Admitting: *Deleted

## 2014-02-26 DIAGNOSIS — C9002 Multiple myeloma in relapse: Secondary | ICD-10-CM

## 2014-02-26 NOTE — Progress Notes (Signed)
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  Pain on a scale of 0-10 is: 0 - patient is using a lidaderm patch and also has 2 mg of dilaudid as needed  IAmbulatory status? Walker? Wheelchair?: uses a cane   SAFETY ISSUES:  Prior radiation? Had radiation to his right groin area in [place] in September 2014, had pinning to his right femur afterwards.  Also has had radioactive seeds placed in his prostate in 1999. Pacemaker/ICD? no  Possible current pregnancy? no  Is the patient on methotrexate? no  Additional Complaints / other details: Patient has 4 children.  He is here with his wife.  He is a retired Administrator.

## 2014-02-27 ENCOUNTER — Encounter: Payer: Self-pay | Admitting: Radiation Oncology

## 2014-02-27 ENCOUNTER — Ambulatory Visit
Admission: RE | Admit: 2014-02-27 | Discharge: 2014-02-27 | Disposition: A | Discharge status: Home / Self Care | Payer: Medicare HMO | Source: Ambulatory Visit | Attending: Radiation Oncology | Admitting: Radiation Oncology

## 2014-02-27 VITALS — BP 137/75 | HR 92 | Temp 98.1°F | Ht 71.0 in | Wt 211.3 lb

## 2014-02-27 DIAGNOSIS — C9002 Multiple myeloma in relapse: Secondary | ICD-10-CM

## 2014-02-27 NOTE — Progress Notes (Signed)
Radiation Oncology         (336) [phone] ________________________________  Initial outpatient Consultation  Name: Arthur Valdez MRN: [id number]  Date: 02/27/2014  DOB: 1933/04/28  RA:QTMAU, Veneda Melter, MD  Volanda Napoleon, MD   REFERRING PHYSICIAN: Volanda Napoleon, MD  DIAGNOSIS: Recurrent lambda chain multiple myeloma  HISTORY OF PRESENT ILLNESS::Arthur Valdez is a 80 y.o. male who is seen out of the courtesy of Dr. Sigurd Sos for an opinion concerning radiation therapy as part of management of patient's multiple myeloma in relapse. Patient has a long history of lambda chain myeloma. He at one point underwent an autologous stem cell transplant at [facility].  More recently the patient has been in relapse. Patient is been on protocol therapy and treatments with Carfilzomib.  The patient is been having more pain in the lower sternum area where he is documented disease. In light of the patient's progressive pain he is now seen in radiation oncology for consideration for palliative treatment.  PREVIOUS RADIATION THERAPY: Yes patient underwent radioactive seed implantation of the prostate several years  ago under the direction of Dr. Danny Lawless.  he also received radiation therapy at [facility] directed at the right pelvis area. He did undergo pinning after this radiation treatment to avoid pathologic fracture  PAST MEDICAL HISTORY:  has a past medical history of Multiple myeloma in relapse (07/16/2011); Anemia of renal disease (07/16/2011); Diabetes mellitus; Hypertension; Arthritis; Anxiety; and Thyroid disease.    PAST SURGICAL HISTORY: Past Surgical History  Procedure Laterality Date  . Joint replacement    . Prostate surgery    . Percutaneous pinning femoral neck fracture Right 2014  . Insertion prostate radiation seed  1999    FAMILY HISTORY: family history includes Cancer in his brother; Diabetes in his brother.  SOCIAL HISTORY:  reports that he quit smoking about  20 years ago. His smoking use included Cigarettes. He has a 12.5 pack-year smoking history. He has never used smokeless tobacco. He reports that he does not drink alcohol or use illicit drugs.  ALLERGIES: Raspberry  MEDICATIONS:  Current Outpatient Prescriptions  Medication Sig Dispense Refill  . albuterol (PROVENTIL HFA) 108 (90 BASE) MCG/ACT inhaler Inhale 2 puffs into the lungs every 4 (four) hours as needed.  2 Inhaler  3  . amLODipine (NORVASC) 10 MG tablet Take 10 mg by mouth.      . B Complex-C (B-COMPLEX WITH VITAMIN C) tablet Take 1 tablet by mouth daily.      Marland Kitchen docusate sodium (STOOL SOFTENER) 100 MG capsule Take 100 mg by mouth.      Marland Kitchen HYDROmorphone (DILAUDID) 2 MG tablet Take 1 tablet (2 mg total) by mouth every 6 (six) hours as needed for moderate pain or severe pain.  120 tablet  0  . insulin glargine (LANTUS) 100 UNIT/ML injection Inject 5 Units into the skin daily as needed. Just take it when sugar is up per patient      . levothyroxine (SYNTHROID) 50 MCG tablet Take by mouth.      . lidocaine (LIDODERM) 5 % Place 1 patch onto the skin daily. Remove & Discard patch within 12 hours or as directed by MD      . loperamide (IMODIUM) 2 MG capsule Take 1 capsule (2 mg total) by mouth as needed for diarrhea or loose stools.  30 capsule  0  . LORazepam (ATIVAN) 0.5 MG tablet Take 1 tablet (0.5 mg total) by mouth every 6 (six) hours  as needed (Nausea or vomiting).  30 tablet  0  . omeprazole (PRILOSEC) 20 MG capsule Take 20 mg by mouth 2 (two) times daily.       . Omeprazole (RA OMEPRAZOLE) 20 MG TBEC Take 40 mg by mouth.      . ranitidine (ZANTAC) 150 MG capsule Take 150 mg by mouth at bedtime.      . sitaGLIPtin (JANUVIA) 100 MG tablet Take 1 tablet (100 mg total) by mouth daily before breakfast.  30 tablet  0  . acyclovir (ZOVIRAX) 400 MG tablet Take 1 tablet (400 mg total) by mouth daily.  30 tablet  3  . clotrimazole (MYCELEX) 10 MG troche Take 1 tablet (10 mg total) by mouth 3  (three) times daily as needed. Mouth sore  90 tablet  0  . dexamethasone (DECADRON) 4 MG tablet Take 2 tablets (8 mg total) by mouth 2 (two) times daily with a meal. Start the day after chemotherapy for 2 days. Take with food.  30 tablet  1  . fentaNYL (DURAGESIC) 12 MCG/HR Place 1 patch (12.5 mcg total) onto the skin every 3 (three) days.  10 patch  0  . insulin glargine (LANTUS) 100 UNIT/ML injection Inject 5 Units into the skin at bedtime. Pt only take if bs > 125      . levothyroxine (SYNTHROID, LEVOTHROID) 50 MCG tablet Take 50 mcg by mouth daily before breakfast.      . LORazepam (ATIVAN) 2 MG tablet Take 1 tablet (2 mg total) by mouth every 8 (eight) hours as needed for anxiety.  90 tablet  1  . ondansetron (ZOFRAN) 8 MG tablet Take 1 tablet (8 mg total) by mouth 2 (two) times daily. Take 1 tablet by mouth (8 mg ) in the am and one tablet by mouth in the pm beginning day 3 or Wednesday, 05/18/12 for 3 days total.  20 tablet  3  . ondansetron (ZOFRAN) 8 MG tablet Take 1 tablet (8 mg total) by mouth 2 (two) times daily. Start the day after chemo for 2 days. Then take as needed for nausea or vomiting.  30 tablet  1  . oxyCODONE (ROXICODONE) 5 MG immediate release tablet Take 1 tablet (5 mg total) by mouth every 4 (four) hours as needed for pain.  40 tablet  0  . prochlorperazine (COMPAZINE) 10 MG tablet Take 1 tablet (10 mg total) by mouth every 6 (six) hours as needed (Nausea or vomiting).  30 tablet  1  . promethazine (PHENERGAN) 25 MG tablet Take 0.5 tablets (12.5 mg total) by mouth every 6 (six) hours as needed for nausea.  30 tablet  0  . traMADol (ULTRAM) 50 MG tablet TAKE 1 TO 2 TABLETS EVERY 6 TO 8 HOURS AS NEEDED FOR PAIN  120 tablet  0   No current facility-administered medications for this encounter.   Facility-Administered Medications Ordered in Other Encounters  Medication Dose Route Frequency Provider Last Rate Last Dose  . 0.9 %  sodium chloride infusion   Intravenous Once Volanda Napoleon, MD      . sodium chloride 0.9 % injection 10 mL  10 mL Intravenous PRN Volanda Napoleon, MD   10 mL at 07/16/11 1002  . sodium chloride 0.9 % injection 10 mL  10 mL Intracatheter PRN Volanda Napoleon, MD   10 mL at 12/01/13 1409  . sodium chloride 0.9 % injection 10 mL  10 mL Intracatheter PRN Volanda Napoleon, MD  REVIEW OF SYSTEMS:  A 15 point review of systems is documented in the electronic medical record. This was obtained by the nursing staff. However, I reviewed this with the patient to discuss relevant findings and make appropriate changes.  Pain in the lower sternal area as above. This is critically bothersome when lying flat. Will interfere with his sleep. Patient also has intermittent pain in the bilateral lower rib cage areas. He denies any headaches dizziness or blurred vision. Patient denies any focal motor weakness   PHYSICAL EXAM:  height is '5\' 11"'  (1.803 m) and weight is 211 lb 4.8 oz (95.845 kg). His oral temperature is 98.1 F (36.7 C). His blood pressure is 137/75 and his pulse is 92. His oxygen saturation is 99%.  this is a very pleasant 80 year old gentleman in no acute distress. He is accompanied by his wife and granddaughter on evaluation today. Examination of the pupils reveals them to be equal round reactive to light. The extraocular eye movements are intact. The tongue is midline. No secondary infection noted the oral cavity or posterior pharynx. No palpable adenopathy in the neck or supraclavicular region. The axillary areas are free of adenopathy. Examination of the lungs with some to be clear. The heart has a regular rhythm and rate. The abdomen is soft and nontender with normal bowel sounds. On neurological examination motor strength is 5 out of 5 in the proximal and distal muscle groups of the upper lower extremities. Peripheral pulses are good. Patient has mild edema in the ankle and foot areas. Palpation along the sternum area reveals mild point tenderness in  the lower sternum area. No obvious masses noted in this area with examination.    ECOG = 1  0 - Asymptomatic (Fully active, able to carry on all predisease activities without restriction)  1 - Symptomatic but completely ambulatory (Restricted in physically strenuous activity but ambulatory and able to carry out work of a light or sedentary nature. For example, light housework, office work)  2 - Symptomatic, <50% in bed during the day (Ambulatory and capable of all self care but unable to carry out any work activities. Up and about more than 50% of waking hours)  3 - Symptomatic, >50% in bed, but not bedbound (Capable of only limited self-care, confined to bed or chair 50% or more of waking hours)  4 - Bedbound (Completely disabled. Cannot carry on any self-care. Totally confined to bed or chair)  5 - Death   Eustace Pen MM, Creech RH, Tormey DC, et al. [phone]). "Toxicity and response criteria of the [facility]". [place] Oncol. 5 (6): 649-55  LABORATORY DATA:  Lab Results  Component Value Date   WBC 7.1 02/22/2014   HGB 9.4* 02/22/2014   HCT 29.0* 02/22/2014   MCV 93 02/22/2014   PLT 134* 02/22/2014   NEUTROABS 4.7 02/22/2014   Lab Results  Component Value Date   NA 140 02/14/2014   K 3.7 02/14/2014   CL 105 02/14/2014   CO2 25 02/14/2014   GLUCOSE 124* 02/14/2014   CREATININE 1.4* 02/14/2014   CALCIUM 8.3 02/14/2014      RADIOGRAPHY: No results found.    IMPRESSION: Multiple myeloma in relapse. The patient is symptomatic with this disease along the lower sternum area. He would be a good candidate for a short course of palliative ration therapy directed at this area.  PLAN: Simulation and planning tomorrow with treatments to get early next week. Anticipate between 8 and 10 radiation treatments.  I spent 60 minutes minutes face to face with the patient and more than 50% of that time was spent in counseling and/or coordination of care.     ------------------------------------------------  Blair Promise, PhD, MD

## 2014-02-27 NOTE — Progress Notes (Signed)
Please see the Nurse Progress Note in the MD Initial Consult Encounter for this patient. 

## 2014-02-28 ENCOUNTER — Ambulatory Visit: Payer: Medicare HMO

## 2014-02-28 ENCOUNTER — Ambulatory Visit
Admission: RE | Admit: 2014-02-28 | Discharge: 2014-02-28 | Disposition: A | Discharge status: Home / Self Care | Payer: Medicare HMO | Source: Ambulatory Visit | Attending: Radiation Oncology | Admitting: Radiation Oncology

## 2014-02-28 DIAGNOSIS — C9002 Multiple myeloma in relapse: Secondary | ICD-10-CM | POA: Insufficient documentation

## 2014-02-28 DIAGNOSIS — Z510 Encounter for antineoplastic radiation therapy: Secondary | ICD-10-CM | POA: Diagnosis present

## 2014-02-28 NOTE — Progress Notes (Signed)
  Radiation Oncology         [phone]) [phone] ________________________________  Name: Arthur Valdez MRN: [id number]  Date: 02/28/2014  DOB: 11-16-1932  SIMULATION AND TREATMENT PLANNING NOTE  DIAGNOSIS:  Multiple myeloma  NARRATIVE:  The patient was brought to the [facility].  Identity was confirmed.  All relevant records and images related to the planned course of therapy were reviewed.  The patient freely provided informed written consent to proceed with treatment after reviewing the details related to the planned course of therapy. The consent form was witnessed and verified by the simulation staff.  Then, the patient was set-up in a stable reproducible  supine position for radiation therapy.  CT images were obtained.  Surface markings were placed.  The CT images were loaded into the planning software.  Then the target and avoidance structures were contoured.  Treatment planning then occurred.  The radiation prescription was entered and confirmed.  Then, I designed and supervised the construction of a total of 1 medically necessary complex treatment devices.  I have requested : Isodose Plan.  I have ordered:dose calc.  PLAN:  The patient will receive 24 Gy in 8 fractions directed at the lower sternum area.  ________________________________  -----------------------------------  Blair Promise, PhD, MD

## 2014-03-01 ENCOUNTER — Ambulatory Visit (HOSPITAL_BASED_OUTPATIENT_CLINIC_OR_DEPARTMENT_OTHER): Payer: Medicare HMO | Admitting: Lab

## 2014-03-01 ENCOUNTER — Ambulatory Visit (HOSPITAL_BASED_OUTPATIENT_CLINIC_OR_DEPARTMENT_OTHER): Payer: Medicare HMO

## 2014-03-01 VITALS — BP 143/77 | HR 95 | Temp 97.2°F | Resp 16

## 2014-03-01 DIAGNOSIS — C9002 Multiple myeloma in relapse: Secondary | ICD-10-CM

## 2014-03-01 DIAGNOSIS — Z5112 Encounter for antineoplastic immunotherapy: Secondary | ICD-10-CM

## 2014-03-01 DIAGNOSIS — Z510 Encounter for antineoplastic radiation therapy: Secondary | ICD-10-CM | POA: Diagnosis not present

## 2014-03-01 LAB — CBC WITH DIFFERENTIAL (CANCER CENTER ONLY)
BASO#: 0 10*3/uL (ref 0.0–0.2)
BASO%: 0.2 % (ref 0.0–2.0)
EOS%: 3.6 % (ref 0.0–7.0)
Eosinophils Absolute: 0.2 10*3/uL (ref 0.0–0.5)
HCT: 26.7 % — ABNORMAL LOW (ref 38.7–49.9)
HGB: 8.6 g/dL — ABNORMAL LOW (ref 13.0–17.1)
LYMPH#: 1 10*3/uL (ref 0.9–3.3)
LYMPH%: 17.9 % (ref 14.0–48.0)
MCH: 30.2 pg (ref 28.0–33.4)
MCHC: 32.2 g/dL (ref 32.0–35.9)
MCV: 94 fL (ref 82–98)
MONO#: 0.9 10*3/uL (ref 0.1–0.9)
MONO%: 16.1 % — ABNORMAL HIGH (ref 0.0–13.0)
NEUT%: 62.2 % (ref 40.0–80.0)
NEUTROS ABS: 3.4 10*3/uL (ref 1.5–6.5)
Platelets: 109 10*3/uL — ABNORMAL LOW (ref 145–400)
RBC: 2.85 10*6/uL — ABNORMAL LOW (ref 4.20–5.70)
RDW: 18.7 % — ABNORMAL HIGH (ref 11.1–15.7)
WBC: 5.5 10*3/uL (ref 4.0–10.0)

## 2014-03-01 LAB — CMP (CANCER CENTER ONLY)
ALBUMIN: 3 g/dL — AB (ref 3.3–5.5)
ALK PHOS: 59 U/L (ref 26–84)
ALT(SGPT): 11 U/L (ref 10–47)
AST: 18 U/L (ref 11–38)
BUN: 17 mg/dL (ref 7–22)
CALCIUM: 8.6 mg/dL (ref 8.0–10.3)
CHLORIDE: 108 meq/L (ref 98–108)
CO2: 23 mEq/L (ref 18–33)
Creat: 1.5 mg/dl — ABNORMAL HIGH (ref 0.6–1.2)
GLUCOSE: 113 mg/dL (ref 73–118)
Potassium: 3.2 mEq/L — ABNORMAL LOW (ref 3.3–4.7)
Sodium: 142 mEq/L (ref 128–145)
Total Bilirubin: 0.7 mg/dl (ref 0.20–1.60)
Total Protein: 6.1 g/dL — ABNORMAL LOW (ref 6.4–8.1)

## 2014-03-01 LAB — HOLD TUBE, BLOOD BANK - CHCC SATELLITE

## 2014-03-01 MED ORDER — HEPARIN SOD (PORK) LOCK FLUSH 100 UNIT/ML IV SOLN
500.0000 [IU] | Freq: Once | INTRAVENOUS | Status: AC | PRN
  Administered 2014-03-01: 500 [IU]
  Filled 2014-03-01: qty 5

## 2014-03-01 MED ORDER — SODIUM CHLORIDE 0.9 % IV SOLN
Freq: Once | INTRAVENOUS | Status: AC
  Administered 2014-03-01: 10:00:00 via INTRAVENOUS

## 2014-03-01 MED ORDER — ALTEPLASE 2 MG IJ SOLR
INTRAMUSCULAR | Status: AC
  Filled 2014-03-01: qty 2

## 2014-03-01 MED ORDER — DEXAMETHASONE SODIUM PHOSPHATE 10 MG/ML IJ SOLN
INTRAMUSCULAR | Status: AC
  Filled 2014-03-01: qty 1

## 2014-03-01 MED ORDER — ONDANSETRON 8 MG/50ML IVPB (CHCC)
8.0000 mg | Freq: Once | INTRAVENOUS | Status: AC
  Administered 2014-03-01: 8 mg via INTRAVENOUS

## 2014-03-01 MED ORDER — ALTEPLASE 2 MG IJ SOLR
2.0000 mg | Freq: Once | INTRAMUSCULAR | Status: AC
  Administered 2014-03-01: 2 mg
  Filled 2014-03-01: qty 2

## 2014-03-01 MED ORDER — SODIUM CHLORIDE 0.9 % IJ SOLN
10.0000 mL | INTRAMUSCULAR | Status: DC | PRN
  Administered 2014-03-01: 10 mL
  Filled 2014-03-01: qty 10

## 2014-03-01 MED ORDER — DEXTROSE 5 % IV SOLN
56.0000 mg/m2 | Freq: Once | INTRAVENOUS | Status: AC
  Administered 2014-03-01: 120 mg via INTRAVENOUS
  Filled 2014-03-01: qty 60

## 2014-03-01 MED ORDER — DEXAMETHASONE SODIUM PHOSPHATE 10 MG/ML IJ SOLN
10.0000 mg | Freq: Once | INTRAMUSCULAR | Status: AC
  Administered 2014-03-01: 10 mg via INTRAVENOUS

## 2014-03-01 NOTE — Patient Instructions (Signed)
Carfilzomib injection What is this medicine? CARFILZOMIB (kar FILZ oh mib) is a chemotherapy drug that works by slowing or stopping cancer cell growth. This medicine is used to treat multiple myeloma. This medicine may be used for other purposes; ask your health care provider or pharmacist if you have questions. COMMON BRAND NAME(S): KYPROLIS What should I tell my health care provider before I take this medicine? They need to know if you have any of these conditions: -heart disease -irregular heartbeat -liver disease -lung or breathing disease -an unusual or allergic reaction to carfilzomib, or other medicines, foods, dyes, or preservatives -pregnant or trying to get pregnant -breast-feeding How should I use this medicine? This medicine is for injection or infusion into a vein. It is given by a health care professional in a hospital or clinic setting. Talk to your pediatrician regarding the use of this medicine in children. Special care may be needed. Overdosage: If you think you've taken too much of this medicine contact a poison control center or emergency room at once. Overdosage: If you think you have taken too much of this medicine contact a poison control center or emergency room at once. NOTE: This medicine is only for you. Do not share this medicine with others. What if I miss a dose? It is important not to miss your dose. Call your doctor or health care professional if you are unable to keep an appointment. What may interact with this medicine? Interactions are not expected. Give your health care provider a list of all the medicines, herbs, non-prescription drugs, or dietary supplements you use. Also tell them if you smoke, drink alcohol, or use illegal drugs. Some items may interact with your medicine. This list may not describe all possible interactions. Give your health care provider a list of all the medicines, herbs, non-prescription drugs, or dietary supplements you use. Also  tell them if you smoke, drink alcohol, or use illegal drugs. Some items may interact with your medicine. What should I watch for while using this medicine? Your condition will be monitored carefully while you are receiving this medicine. Report any side effects. Continue your course of treatment even though you feel ill unless your doctor tells you to stop. Call your doctor or health care professional for advice if you get a fever, chills or sore throat, or other symptoms of a cold or flu. Do not treat yourself. Try to avoid being around people who are sick. Do not become pregnant while taking this medicine. Women should inform their doctor if they wish to become pregnant or think they might be pregnant. There is a potential for serious side effects to an unborn child. Talk to your health care professional or pharmacist for more information. Do not breast-feed an infant while taking this medicine. Check with your doctor or health care professional if you get an attack of severe diarrhea, nausea and vomiting, or if you sweat a lot. The loss of too much body fluid can make it dangerous for you to take this medicine. You may get dizzy. Do not drive, use machinery, or do anything that needs mental alertness until you know how this medicine affects you. Do not stand or sit up quickly, especially if you are an older patient. This reduces the risk of dizzy or fainting spells. What side effects may I notice from receiving this medicine? Side effects that you should report to your doctor or health care professional as soon as possible: -allergic reactions like skin rash, itching or hives,  swelling of the face, lips, or tongue -breathing problems -chest pain or palpitationschest tightness -cough -dark urine -dizziness -feeling faint or lightheaded -fever or chills -general ill feeling or flu-like symptoms -light-colored stools -palpitations -right upper belly pain -swelling of the legs or ankles -unusual  bleeding or bruising -unusually weak or tired -yellowing of the eyes or skin Side effects that usually do not require medical attention (Report these to your doctor or health care professional if they continue or are bothersome.): -diarrhea -headache -nausea, vomiting -tiredness This list may not describe all possible side effects. Call your doctor for medical advice about side effects. You may report side effects to FDA at 1-800-FDA-1088. Where should I keep my medicine? This drug is given in a hospital or clinic and will not be stored at home. NOTE: This sheet is a summary. It may not cover all possible information. If you have questions about this medicine, talk to your doctor, pharmacist, or health care provider.  2015, [employer]. (2012-01-22 17:02:29)

## 2014-03-02 ENCOUNTER — Encounter: Payer: Self-pay | Admitting: *Deleted

## 2014-03-02 ENCOUNTER — Other Ambulatory Visit (HOSPITAL_BASED_OUTPATIENT_CLINIC_OR_DEPARTMENT_OTHER): Payer: Medicare HMO | Admitting: Lab

## 2014-03-02 ENCOUNTER — Ambulatory Visit (HOSPITAL_BASED_OUTPATIENT_CLINIC_OR_DEPARTMENT_OTHER): Payer: Medicare HMO

## 2014-03-02 ENCOUNTER — Other Ambulatory Visit: Payer: Self-pay | Admitting: *Deleted

## 2014-03-02 VITALS — BP 140/78 | HR 104 | Temp 97.9°F | Resp 18

## 2014-03-02 DIAGNOSIS — C9002 Multiple myeloma in relapse: Secondary | ICD-10-CM

## 2014-03-02 DIAGNOSIS — Z5112 Encounter for antineoplastic immunotherapy: Secondary | ICD-10-CM

## 2014-03-02 LAB — CBC WITH DIFFERENTIAL (CANCER CENTER ONLY)
BASO#: 0 10*3/uL (ref 0.0–0.2)
BASO%: 0.2 % (ref 0.0–2.0)
EOS%: 0.1 % (ref 0.0–7.0)
Eosinophils Absolute: 0 10*3/uL (ref 0.0–0.5)
HCT: 24.8 % — ABNORMAL LOW (ref 38.7–49.9)
HGB: 8.3 g/dL — ABNORMAL LOW (ref 13.0–17.1)
LYMPH#: 0.7 10*3/uL — ABNORMAL LOW (ref 0.9–3.3)
LYMPH%: 6.5 % — ABNORMAL LOW (ref 14.0–48.0)
MCH: 31 pg (ref 28.0–33.4)
MCHC: 33.5 g/dL (ref 32.0–35.9)
MCV: 93 fL (ref 82–98)
MONO#: 1.5 10*3/uL — ABNORMAL HIGH (ref 0.1–0.9)
MONO%: 13.3 % — ABNORMAL HIGH (ref 0.0–13.0)
NEUT#: 8.7 10*3/uL — ABNORMAL HIGH (ref 1.5–6.5)
NEUT%: 79.9 % (ref 40.0–80.0)
Platelets: 127 10*3/uL — ABNORMAL LOW (ref 145–400)
RBC: 2.68 10*6/uL — ABNORMAL LOW (ref 4.20–5.70)
RDW: 18.4 % — AB (ref 11.1–15.7)
WBC: 10.9 10*3/uL — ABNORMAL HIGH (ref 4.0–10.0)

## 2014-03-02 LAB — TECHNOLOGIST REVIEW CHCC SATELLITE

## 2014-03-02 MED ORDER — DEXAMETHASONE SODIUM PHOSPHATE 10 MG/ML IJ SOLN
INTRAMUSCULAR | Status: AC
  Filled 2014-03-02: qty 1

## 2014-03-02 MED ORDER — SODIUM CHLORIDE 0.9 % IV SOLN
Freq: Once | INTRAVENOUS | Status: AC
  Administered 2014-03-02: 10:00:00 via INTRAVENOUS

## 2014-03-02 MED ORDER — DEXTROSE 5 % IV SOLN
56.0000 mg/m2 | Freq: Once | INTRAVENOUS | Status: AC
  Administered 2014-03-02: 120 mg via INTRAVENOUS
  Filled 2014-03-02: qty 60

## 2014-03-02 MED ORDER — DEXAMETHASONE SODIUM PHOSPHATE 10 MG/ML IJ SOLN
10.0000 mg | Freq: Once | INTRAMUSCULAR | Status: AC
  Administered 2014-03-02: 10 mg via INTRAVENOUS

## 2014-03-02 MED ORDER — ZOLEDRONIC ACID 4 MG/5ML IV CONC
3.5000 mg | Freq: Once | INTRAVENOUS | Status: AC
  Administered 2014-03-02: 3.5 mg via INTRAVENOUS
  Filled 2014-03-02: qty 4.38

## 2014-03-02 MED ORDER — HEPARIN SOD (PORK) LOCK FLUSH 100 UNIT/ML IV SOLN
500.0000 [IU] | Freq: Once | INTRAVENOUS | Status: AC | PRN
  Administered 2014-03-02: 500 [IU]
  Filled 2014-03-02: qty 5

## 2014-03-02 MED ORDER — SODIUM CHLORIDE 0.9 % IJ SOLN
10.0000 mL | INTRAMUSCULAR | Status: DC | PRN
  Administered 2014-03-02: 10 mL
  Filled 2014-03-02: qty 10

## 2014-03-02 MED ORDER — ONDANSETRON 8 MG/50ML IVPB (CHCC)
8.0000 mg | Freq: Once | INTRAVENOUS | Status: AC
  Administered 2014-03-02: 8 mg via INTRAVENOUS

## 2014-03-02 NOTE — Patient Instructions (Signed)
Carfilzomib injection What is this medicine? CARFILZOMIB (kar FILZ oh mib) is a chemotherapy drug that works by slowing or stopping cancer cell growth. This medicine is used to treat multiple myeloma. This medicine may be used for other purposes; ask your health care provider or pharmacist if you have questions. COMMON BRAND NAME(S): KYPROLIS What should I tell my health care provider before I take this medicine? They need to know if you have any of these conditions: -heart disease -irregular heartbeat -liver disease -lung or breathing disease -an unusual or allergic reaction to carfilzomib, or other medicines, foods, dyes, or preservatives -pregnant or trying to get pregnant -breast-feeding How should I use this medicine? This medicine is for injection or infusion into a vein. It is given by a health care professional in a hospital or clinic setting. Talk to your pediatrician regarding the use of this medicine in children. Special care may be needed. Overdosage: If you think you've taken too much of this medicine contact a poison control center or emergency room at once. Overdosage: If you think you have taken too much of this medicine contact a poison control center or emergency room at once. NOTE: This medicine is only for you. Do not share this medicine with others. What if I miss a dose? It is important not to miss your dose. Call your doctor or health care professional if you are unable to keep an appointment. What may interact with this medicine? Interactions are not expected. Give your health care provider a list of all the medicines, herbs, non-prescription drugs, or dietary supplements you use. Also tell them if you smoke, drink alcohol, or use illegal drugs. Some items may interact with your medicine. This list may not describe all possible interactions. Give your health care provider a list of all the medicines, herbs, non-prescription drugs, or dietary supplements you use. Also  tell them if you smoke, drink alcohol, or use illegal drugs. Some items may interact with your medicine. What should I watch for while using this medicine? Your condition will be monitored carefully while you are receiving this medicine. Report any side effects. Continue your course of treatment even though you feel ill unless your doctor tells you to stop. Call your doctor or health care professional for advice if you get a fever, chills or sore throat, or other symptoms of a cold or flu. Do not treat yourself. Try to avoid being around people who are sick. Do not become pregnant while taking this medicine. Women should inform their doctor if they wish to become pregnant or think they might be pregnant. There is a potential for serious side effects to an unborn child. Talk to your health care professional or pharmacist for more information. Do not breast-feed an infant while taking this medicine. Check with your doctor or health care professional if you get an attack of severe diarrhea, nausea and vomiting, or if you sweat a lot. The loss of too much body fluid can make it dangerous for you to take this medicine. You may get dizzy. Do not drive, use machinery, or do anything that needs mental alertness until you know how this medicine affects you. Do not stand or sit up quickly, especially if you are an older patient. This reduces the risk of dizzy or fainting spells. What side effects may I notice from receiving this medicine? Side effects that you should report to your doctor or health care professional as soon as possible: -allergic reactions like skin rash, itching or hives,  swelling of the face, lips, or tongue -breathing problems -chest pain or palpitationschest tightness -cough -dark urine -dizziness -feeling faint or lightheaded -fever or chills -general ill feeling or flu-like symptoms -light-colored stools -palpitations -right upper belly pain -swelling of the legs or ankles -unusual  bleeding or bruising -unusually weak or tired -yellowing of the eyes or skin Side effects that usually do not require medical attention (Report these to your doctor or health care professional if they continue or are bothersome.): -diarrhea -headache -nausea, vomiting -tiredness This list may not describe all possible side effects. Call your doctor for medical advice about side effects. You may report side effects to FDA at 1-800-FDA-1088. Where should I keep my medicine? This drug is given in a hospital or clinic and will not be stored at home. NOTE: This sheet is a summary. It may not cover all possible information. If you have questions about this medicine, talk to your doctor, pharmacist, or health care provider.  2015, [employer]. (2012-01-22 17:02:29)

## 2014-03-02 NOTE — Progress Notes (Signed)
[facility] Psychosocial Distress Screening Clinical Social Work  Clinical Social Work was referred by distress screening protocol.  The patient scored a 9 on the Psychosocial Distress Thermometer which indicates severe distress. Clinical Social Worker phoned pt at home to assess for distress and other psychosocial needs. CSW spoke with pt's wife who stated he was at [facility] receiving chemo currently. CSW introduced self and role to wife. Per wife, pt is to start radiation next week and CSW will check on them at upcoming appointments next week. Wife appreciative and will relay message to pt.   ONCBCN DISTRESS SCREENING 02/27/2014  Screening Type Initial Screening  Elta [place] the number that describes how much distress you have been experiencing in the past week 9  Emotional problem type Depression;Nervousness/Anxiety;Isolation/feeling alone;Feeling hopeless;Boredom;Adjusting to appearance changes  Spiritual/Religous concerns type Loss of sense of purpose  Physical Problem type Pain;Sleep/insomnia;Loss of appetitie;Tingling hands/feet  Physician notified of physical symptoms Yes     Clinical Social Worker follow up needed: Yes.    If yes, follow up plan: See above.   Loren Racer, LCSW Clinical Social Worker [facility] [place] Wednesday, Thursday and Friday Phone: [phone] Fax: [phone]

## 2014-03-05 ENCOUNTER — Other Ambulatory Visit: Payer: Self-pay | Admitting: *Deleted

## 2014-03-05 ENCOUNTER — Ambulatory Visit (HOSPITAL_BASED_OUTPATIENT_CLINIC_OR_DEPARTMENT_OTHER): Payer: Medicare HMO | Admitting: Hematology & Oncology

## 2014-03-05 ENCOUNTER — Ambulatory Visit (HOSPITAL_COMMUNITY)
Admission: RE | Admit: 2014-03-05 | Discharge: 2014-03-05 | Disposition: A | Discharge status: Home / Self Care | Payer: Medicare HMO | Source: Ambulatory Visit | Attending: Hematology & Oncology | Admitting: Hematology & Oncology

## 2014-03-05 ENCOUNTER — Telehealth: Payer: Self-pay | Admitting: Hematology & Oncology

## 2014-03-05 ENCOUNTER — Other Ambulatory Visit (HOSPITAL_BASED_OUTPATIENT_CLINIC_OR_DEPARTMENT_OTHER): Payer: Medicare HMO | Admitting: Lab

## 2014-03-05 VITALS — BP 147/78 | HR 98 | Temp 97.3°F | Resp 22

## 2014-03-05 DIAGNOSIS — D649 Anemia, unspecified: Secondary | ICD-10-CM | POA: Insufficient documentation

## 2014-03-05 DIAGNOSIS — C9002 Multiple myeloma in relapse: Secondary | ICD-10-CM

## 2014-03-05 LAB — CBC WITH DIFFERENTIAL (CANCER CENTER ONLY)
BASO#: 0 10*3/uL (ref 0.0–0.2)
BASO%: 0.5 % (ref 0.0–2.0)
EOS ABS: 0.2 10*3/uL (ref 0.0–0.5)
EOS%: 3.4 % (ref 0.0–7.0)
HCT: 26.1 % — ABNORMAL LOW (ref 38.7–49.9)
HGB: 8.6 g/dL — ABNORMAL LOW (ref 13.0–17.1)
LYMPH#: 0.6 10*3/uL — AB (ref 0.9–3.3)
LYMPH%: 11.6 % — ABNORMAL LOW (ref 14.0–48.0)
MCH: 30.5 pg (ref 28.0–33.4)
MCHC: 33 g/dL (ref 32.0–35.9)
MCV: 93 fL (ref 82–98)
MONO#: 1 10*3/uL — ABNORMAL HIGH (ref 0.1–0.9)
MONO%: 18.8 % — ABNORMAL HIGH (ref 0.0–13.0)
NEUT%: 65.7 % (ref 40.0–80.0)
NEUTROS ABS: 3.6 10*3/uL (ref 1.5–6.5)
Platelets: 55 10*3/uL — ABNORMAL LOW (ref 145–400)
RBC: 2.82 10*6/uL — AB (ref 4.20–5.70)
RDW: 18.4 % — ABNORMAL HIGH (ref 11.1–15.7)
WBC: 5.5 10*3/uL (ref 4.0–10.0)

## 2014-03-05 LAB — PROTEIN ELECTROPHORESIS, SERUM, WITH REFLEX
Albumin ELP: 57.1 % (ref 55.8–66.1)
Alpha-1-Globulin: 12.9 % — ABNORMAL HIGH (ref 2.9–4.9)
Alpha-2-Globulin: 13.2 % — ABNORMAL HIGH (ref 7.1–11.8)
BETA 2: 4.1 % (ref 3.2–6.5)
Beta Globulin: 8.5 % — ABNORMAL HIGH (ref 4.7–7.2)
Gamma Globulin: 4.2 % — ABNORMAL LOW (ref 11.1–18.8)
M-SPIKE, %: 0.15 g/dL
Total Protein, Serum Electrophoresis: 5.3 g/dL — ABNORMAL LOW (ref 6.0–8.3)

## 2014-03-05 LAB — LACTATE DEHYDROGENASE: LDH: 231 U/L (ref 94–250)

## 2014-03-05 LAB — KAPPA/LAMBDA LIGHT CHAINS
KAPPA FREE LGHT CHN: 0.03 mg/dL — AB (ref 0.33–1.94)
Kappa:Lambda Ratio: 0 — ABNORMAL LOW (ref 0.26–1.65)
LAMBDA FREE LGHT CHN: 406 mg/dL — AB (ref 0.57–2.63)

## 2014-03-05 LAB — HOLD TUBE, BLOOD BANK - CHCC SATELLITE

## 2014-03-05 LAB — IFE INTERPRETATION

## 2014-03-05 LAB — IGG, IGA, IGM
IGG (IMMUNOGLOBIN G), SERUM: 194 mg/dL — AB (ref 650–1600)
IGM, SERUM: 5 mg/dL — AB (ref 41–251)
IgA: 8 mg/dL — ABNORMAL LOW (ref 68–379)

## 2014-03-05 NOTE — Telephone Encounter (Signed)
Patient's wife called and stated Mr.Nations wasn't feeling well.  Per Roxanne Gates to sch lab apt for this morning

## 2014-03-06 ENCOUNTER — Ambulatory Visit (HOSPITAL_COMMUNITY)
Admission: RE | Admit: 2014-03-06 | Discharge: 2014-03-06 | Disposition: A | Discharge status: Home / Self Care | Payer: Medicare HMO | Source: Ambulatory Visit | Attending: Hematology & Oncology | Admitting: Hematology & Oncology

## 2014-03-06 ENCOUNTER — Ambulatory Visit (HOSPITAL_BASED_OUTPATIENT_CLINIC_OR_DEPARTMENT_OTHER): Payer: Medicare HMO

## 2014-03-06 VITALS — BP 136/76 | HR 81 | Temp 97.7°F | Resp 18

## 2014-03-06 DIAGNOSIS — C9002 Multiple myeloma in relapse: Secondary | ICD-10-CM

## 2014-03-06 DIAGNOSIS — C9000 Multiple myeloma not having achieved remission: Secondary | ICD-10-CM | POA: Insufficient documentation

## 2014-03-06 LAB — PREPARE RBC (CROSSMATCH)

## 2014-03-06 MED ORDER — HEPARIN SOD (PORK) LOCK FLUSH 100 UNIT/ML IV SOLN
500.0000 [IU] | Freq: Once | INTRAVENOUS | Status: AC
  Administered 2014-03-06: 500 [IU] via INTRAVENOUS
  Filled 2014-03-06: qty 5

## 2014-03-06 MED ORDER — SODIUM CHLORIDE 0.9 % IV SOLN
250.0000 mL | Freq: Once | INTRAVENOUS | Status: AC
  Administered 2014-03-06: 250 mL via INTRAVENOUS

## 2014-03-06 MED ORDER — HEPARIN SOD (PORK) LOCK FLUSH 100 UNIT/ML IV SOLN
250.0000 [IU] | INTRAVENOUS | Status: DC | PRN
  Filled 2014-03-06: qty 5

## 2014-03-06 MED ORDER — SODIUM CHLORIDE 0.9 % IJ SOLN
10.0000 mL | INTRAMUSCULAR | Status: DC | PRN
  Administered 2014-03-06: 10 mL via INTRAVENOUS
  Filled 2014-03-06: qty 10

## 2014-03-06 MED ORDER — SODIUM CHLORIDE 0.9 % IJ SOLN
10.0000 mL | INTRAMUSCULAR | Status: DC | PRN
  Filled 2014-03-06: qty 10

## 2014-03-06 MED ORDER — FUROSEMIDE 10 MG/ML IJ SOLN
INTRAMUSCULAR | Status: AC
  Filled 2014-03-06: qty 4

## 2014-03-06 MED ORDER — DIPHENHYDRAMINE HCL 25 MG PO CAPS
25.0000 mg | ORAL_CAPSULE | Freq: Once | ORAL | Status: AC
  Administered 2014-03-06: 25 mg via ORAL

## 2014-03-06 MED ORDER — ONDANSETRON HCL 8 MG PO TABS
ORAL_TABLET | ORAL | Status: AC
  Filled 2014-03-06: qty 1

## 2014-03-06 MED ORDER — FUROSEMIDE 10 MG/ML IJ SOLN
20.0000 mg | Freq: Once | INTRAMUSCULAR | Status: AC
  Administered 2014-03-06: 20 mg via INTRAVENOUS

## 2014-03-06 MED ORDER — ACETAMINOPHEN 325 MG PO TABS
650.0000 mg | ORAL_TABLET | Freq: Once | ORAL | Status: AC
  Administered 2014-03-06: 650 mg via ORAL

## 2014-03-06 MED ORDER — SODIUM CHLORIDE 0.9 % IJ SOLN
3.0000 mL | INTRAMUSCULAR | Status: DC | PRN
  Filled 2014-03-06: qty 10

## 2014-03-06 MED ORDER — HEPARIN SOD (PORK) LOCK FLUSH 100 UNIT/ML IV SOLN
500.0000 [IU] | Freq: Every day | INTRAVENOUS | Status: DC | PRN
  Filled 2014-03-06: qty 5

## 2014-03-06 MED ORDER — DIPHENHYDRAMINE HCL 25 MG PO CAPS
ORAL_CAPSULE | ORAL | Status: AC
  Filled 2014-03-06: qty 2

## 2014-03-06 MED ORDER — ONDANSETRON HCL 8 MG PO TABS
8.0000 mg | ORAL_TABLET | Freq: Once | ORAL | Status: AC
  Administered 2014-03-06: 8 mg via ORAL

## 2014-03-06 MED ORDER — ACETAMINOPHEN 325 MG PO TABS
ORAL_TABLET | ORAL | Status: AC
  Filled 2014-03-06: qty 2

## 2014-03-06 NOTE — Progress Notes (Signed)
Hematology and Oncology Follow Up Visit  Arthur Valdez [id number] [birth year] 80 y.o. 03/06/2014   Principle Diagnosis:  Recurrent lambda light chain myeloma - progressive  Current Therapy:   Carfilzomib      Interim History:  Mr.  Arthur Valdez is back for a visit that I wanted to see him for. Unfortunately, he is beginning to progress again. His lambda light chain levels are going up. It is now up to 406 mg/dL.  He has started radiation treatment. He was having some tenderness about the sternum. He had active disease there on a PET scan.  He has tolerated his treatments well. Again, I think we have to stop them and see what else we can try for him.  I think a good option for him would be the new agent, Janey Greaser, which was recently approved by the FDA. We can combine this with Velcade.  Hopefully, the FDA will approve one of the monoclonal antibodies, daratumimab, which I think would be a very good choice for him.  Overall, his performance status is still good with it a level of ECOG 1.  His appetite is okay. He's had no problems with cough or shortness of breath. He's had no leg swelling.  He does feel a little more fatigued. He probably can go ahead and transfuse him.    Medications: Current outpatient prescriptions:albuterol (PROVENTIL HFA) 108 (90 BASE) MCG/ACT inhaler, Inhale 2 puffs into the lungs every 4 (four) hours as needed., Disp: 2 Inhaler, Rfl: 3;  amLODipine (NORVASC) 10 MG tablet, Take 10 mg by mouth., Disp: , Rfl: ;  B Complex-C (B-COMPLEX WITH VITAMIN C) tablet, Take 1 tablet by mouth daily., Disp: , Rfl:  clotrimazole (MYCELEX) 10 MG troche, Take 1 tablet (10 mg total) by mouth 3 (three) times daily as needed. Mouth sore, Disp: 90 tablet, Rfl: 0;  docusate sodium (STOOL SOFTENER) 100 MG capsule, Take 100 mg by mouth., Disp: , Rfl: ;  fentaNYL (DURAGESIC) 12 MCG/HR, Place 1 patch (12.5 mcg total) onto the skin every 3 (three) days., Disp: 10 patch, Rfl: 0 HYDROmorphone  (DILAUDID) 2 MG tablet, Take 1 tablet (2 mg total) by mouth every 6 (six) hours as needed for moderate pain or severe pain., Disp: 120 tablet, Rfl: 0;  insulin glargine (LANTUS) 100 UNIT/ML injection, Inject 5 Units into the skin at bedtime. Pt only take if bs > 125, Disp: , Rfl: ;  insulin glargine (LANTUS) 100 UNIT/ML injection, Inject 5 Units into the skin daily as needed. Just take it when sugar is up per patient, Disp: , Rfl:  levothyroxine (SYNTHROID) 50 MCG tablet, Take by mouth., Disp: , Rfl: ;  levothyroxine (SYNTHROID, LEVOTHROID) 50 MCG tablet, Take 50 mcg by mouth daily before breakfast., Disp: , Rfl: ;  lidocaine (LIDODERM) 5 %, Place 1 patch onto the skin daily. Remove & Discard patch within 12 hours or as directed by MD, Disp: , Rfl:  loperamide (IMODIUM) 2 MG capsule, Take 1 capsule (2 mg total) by mouth as needed for diarrhea or loose stools., Disp: 30 capsule, Rfl: 0;  LORazepam (ATIVAN) 2 MG tablet, Take 1 tablet (2 mg total) by mouth every 8 (eight) hours as needed for anxiety., Disp: 90 tablet, Rfl: 1;  omeprazole (PRILOSEC) 20 MG capsule, Take 20 mg by mouth 2 (two) times daily. , Disp: , Rfl: ;  Omeprazole (RA OMEPRAZOLE) 20 MG TBEC, Take 40 mg by mouth., Disp: , Rfl:  ondansetron (ZOFRAN) 8 MG tablet, Take 1 tablet (8 mg total)  by mouth 2 (two) times daily. Take 1 tablet by mouth (8 mg ) in the am and one tablet by mouth in the pm beginning day 3 or Wednesday, 05/18/12 for 3 days total., Disp: 20 tablet, Rfl: 3;  oxyCODONE (ROXICODONE) 5 MG immediate release tablet, Take 1 tablet (5 mg total) by mouth every 4 (four) hours as needed for pain., Disp: 40 tablet, Rfl: 0 promethazine (PHENERGAN) 25 MG tablet, Take 0.5 tablets (12.5 mg total) by mouth every 6 (six) hours as needed for nausea., Disp: 30 tablet, Rfl: 0;  ranitidine (ZANTAC) 150 MG capsule, Take 150 mg by mouth at bedtime., Disp: , Rfl: ;  sitaGLIPtin (JANUVIA) 100 MG tablet, Take 1 tablet (100 mg total) by mouth daily before  breakfast., Disp: 30 tablet, Rfl: 0 traMADol (ULTRAM) 50 MG tablet, TAKE 1 TO 2 TABLETS EVERY 6 TO 8 HOURS AS NEEDED FOR PAIN, Disp: 120 tablet, Rfl: 0;  [DISCONTINUED] prochlorperazine (COMPAZINE) 10 MG tablet, Take 1 tablet (10 mg total) by mouth every 6 (six) hours as needed (Nausea or vomiting)., Disp: 30 tablet, Rfl: 1 No current facility-administered medications for this visit. Facility-Administered Medications Ordered in Other Visits: sodium chloride 0.9 % injection 10 mL, 10 mL, Intravenous, PRN, Volanda Napoleon, MD, 10 mL at 07/16/11 1002  Allergies:  Allergies  Allergen Reactions  . Raspberry     hives    Past Medical History, Surgical history, Social history, and Family History were reviewed and updated.  Review of Systems:  as above  Physical Exam:  oral temperature is 97.3 F (36.3 C). His blood pressure is 147/78 and his pulse is 98. His respiration is 22.   Well-developed and well-nourished African American gentleman. Head and neck exam shows no ocular or oral lesions. He has no palpable cervical or supraclavicular lymph nodes. Lungs are clear bilaterally. Cardiac exam regular rate and rhythm with no murmurs rubs or bruits. Abdomen is soft. He has good bowel sounds. There is no fluid wave. There is no palpable liver or spleen. Extremities shows no clubbing cyanosis or edema. He has good range motion of his 20s. Skin exam no rashes. Neurological exam is nonfocal.  Lab Results  Component Value Date   WBC 5.5 03/05/2014   HGB 8.6* 03/05/2014   HCT 26.1* 03/05/2014   MCV 93 03/05/2014   PLT 55* 03/05/2014     Chemistry      Component Value Date/Time   NA 142 03/01/2014 0917   NA 140 12/14/2013 1302   K 3.2* 03/01/2014 0917   K 3.6 12/14/2013 1302   CL 108 03/01/2014 0917   CL 109 12/14/2013 1302   CO2 23 03/01/2014 0917   CO2 22 12/14/2013 1302   BUN 17 03/01/2014 0917   BUN 17 12/14/2013 1302   CREATININE 1.5* 03/01/2014 0917   CREATININE 1.22 12/14/2013 1302      Component  Value Date/Time   CALCIUM 8.6 03/01/2014 0917   CALCIUM 8.0* 12/14/2013 1302   ALKPHOS 59 03/01/2014 0917   ALKPHOS 53 12/14/2013 1302   AST 18 03/01/2014 0917   AST 12 12/14/2013 1302   ALT 11 03/01/2014 0917   ALT <8 12/14/2013 1302   BILITOT 0.70 03/01/2014 0917   BILITOT 0.5 12/14/2013 1302         Impression and Plan: Mr. Arthur Valdez is a 80 year old African American gentleman. He has refractory lambda light chain myeloma.  He had been on Carfilzomib for a while. He has it done well with this. Again,  his levels aren't back up in I believe that he is becoming resistant.  I talked to he and his wife at length. I spent over a half hour with him. I went over the options. He has been seen at [facility]. He really does not want to go back there. He wants to try to stay local.  I went over the side effects of  Farydak. I think he would tolerate this well. We will have to see about getting this set up for him.  We will go ahead and transfuse him tomorrow.  I will plan to see him back myself in another 3 or 4 weeks.  Volanda Napoleon, MD 7/21/20157:12 AM

## 2014-03-06 NOTE — Patient Instructions (Signed)

## 2014-03-07 ENCOUNTER — Ambulatory Visit
Admission: RE | Admit: 2014-03-07 | Discharge: 2014-03-07 | Disposition: A | Discharge status: Home / Self Care | Payer: Medicare HMO | Source: Ambulatory Visit | Attending: Radiation Oncology | Admitting: Radiation Oncology

## 2014-03-07 ENCOUNTER — Encounter: Payer: Self-pay | Admitting: Hematology & Oncology

## 2014-03-07 DIAGNOSIS — Z510 Encounter for antineoplastic radiation therapy: Secondary | ICD-10-CM | POA: Diagnosis not present

## 2014-03-07 DIAGNOSIS — C9002 Multiple myeloma in relapse: Secondary | ICD-10-CM

## 2014-03-07 LAB — TYPE AND SCREEN
ABO/RH(D): O POS
ANTIBODY SCREEN: NEGATIVE
UNIT DIVISION: 0
Unit division: 0

## 2014-03-08 ENCOUNTER — Encounter: Payer: Self-pay | Admitting: *Deleted

## 2014-03-08 ENCOUNTER — Ambulatory Visit
Admission: RE | Admit: 2014-03-08 | Discharge: 2014-03-08 | Disposition: A | Discharge status: Home / Self Care | Payer: Medicare HMO | Source: Ambulatory Visit | Attending: Radiation Oncology | Admitting: Radiation Oncology

## 2014-03-08 DIAGNOSIS — Z510 Encounter for antineoplastic radiation therapy: Secondary | ICD-10-CM | POA: Diagnosis not present

## 2014-03-08 NOTE — Progress Notes (Signed)
  Radiation Oncology         [phone] ________________________________  Name: Arthur Valdez MRN: [id number]  Date: 03/07/2014  DOB: May 13, 1933  Simulation Verification Note  Status: outpatient  NARRATIVE: The patient was brought to the treatment unit and placed in the planned treatment position. The clinical setup was verified. Then port films were obtained and uploaded to the radiation oncology medical record software.  The treatment beams were carefully compared against the planned radiation fields. The position location and shape of the radiation fields was reviewed. They targeted volume of tissue appears to be appropriately covered by the radiation beams. Organs at risk appear to be excluded as planned.  Based on my personal review, I approved the simulation verification. The patient's treatment will proceed as planned.  -----------------------------------  Blair Promise, PhD, MD

## 2014-03-08 NOTE — Progress Notes (Signed)
[place] Work  Clinical Social Work was referred by distress screen and had spoken to pt's wife several weeks ago and left message for pt. Clinical Social Worker met with patient at [facility] after radiation treatment to offer support and assess for needs. Pt reports he is doing pretty well, but his main concern and distress is pain. Pt reports he has shared this with the medical team and now has what he needs to help. Pt reports to have good support from his wife and she helps get him to appointments. CSW educated pt on CSW role and resources of the [facility]. CSW provided him with Pt and Family Support Team handout and info. Pt denies other concerns currently, but agrees to seek out CSW as needed.     Clinical Social Work interventions: Assessment Supportive listening Resource education  Loren Racer, [place] Clinical Social Worker [facility] [place] Wednesday, Thursday and Friday Phone: [phone] Fax: [phone]

## 2014-03-09 ENCOUNTER — Ambulatory Visit
Admission: RE | Admit: 2014-03-09 | Discharge: 2014-03-09 | Disposition: A | Discharge status: Home / Self Care | Payer: Medicare HMO | Source: Ambulatory Visit | Attending: Radiation Oncology | Admitting: Radiation Oncology

## 2014-03-09 DIAGNOSIS — Z510 Encounter for antineoplastic radiation therapy: Secondary | ICD-10-CM | POA: Diagnosis not present

## 2014-03-09 DIAGNOSIS — C9002 Multiple myeloma in relapse: Secondary | ICD-10-CM

## 2014-03-09 MED ORDER — RADIAPLEXRX EX GEL
Freq: Once | CUTANEOUS | Status: AC
  Administered 2014-03-09: 14:00:00 via TOPICAL

## 2014-03-09 NOTE — Addendum Note (Signed)
Encounter addended by: Jacqulyn Liner, RN on: 03/09/2014  2:32 PM<BR>     Documentation filed: Visit Diagnoses

## 2014-03-09 NOTE — Progress Notes (Signed)
Arthur Valdez was given the Radiation Therapy and You book and discussed potential side effects/management of pain, skin irritation, fatigue and throat changes.  He was given radiaplex gel and was instructed to apply it after treatment and at bedtime if he has skin redness.  He was educated about undertreat day with Dr. Sondra Come on Tuesdays.  He was advised to call with any questions or concerns.

## 2014-03-12 ENCOUNTER — Ambulatory Visit
Admission: RE | Admit: 2014-03-12 | Discharge: 2014-03-12 | Disposition: A | Discharge status: Home / Self Care | Payer: Medicare HMO | Source: Ambulatory Visit | Attending: Radiation Oncology | Admitting: Radiation Oncology

## 2014-03-12 DIAGNOSIS — Z510 Encounter for antineoplastic radiation therapy: Secondary | ICD-10-CM | POA: Diagnosis not present

## 2014-03-13 ENCOUNTER — Ambulatory Visit
Admission: RE | Admit: 2014-03-13 | Discharge: 2014-03-13 | Disposition: A | Discharge status: Home / Self Care | Payer: Medicare HMO | Source: Ambulatory Visit | Attending: Radiation Oncology | Admitting: Radiation Oncology

## 2014-03-13 ENCOUNTER — Encounter: Payer: Self-pay | Admitting: Radiation Oncology

## 2014-03-13 VITALS — BP 164/95 | HR 96 | Temp 97.6°F | Resp 20 | Wt 207.4 lb

## 2014-03-13 DIAGNOSIS — Z510 Encounter for antineoplastic radiation therapy: Secondary | ICD-10-CM | POA: Diagnosis not present

## 2014-03-13 DIAGNOSIS — C9002 Multiple myeloma in relapse: Secondary | ICD-10-CM

## 2014-03-13 NOTE — Progress Notes (Signed)
  Radiation Oncology         [phone] ________________________________  Name: Arthur Valdez MRN: [id number]  Date: 03/13/2014  DOB: 1932-11-30  Weekly Radiation Therapy Management  Multiple myeloma in relapse  Current Dose: 15 Gy     Planned Dose:  24 Gy  Narrative . . . . . . . . The patient presents for routine under treatment assessment.                                   The patient is without complaint. His pain is actually better thus far                                 Set-up films were reviewed.                                 The chart was checked. Physical Findings. . .  weight is 207 lb 6.4 oz (94.076 kg). His oral temperature is 97.6 F (36.4 C). His blood pressure is 164/95 and his pulse is 96. His respiration is 20 and oxygen saturation is 96%. . The lungs are clear. The heart has a regular rhythm and rate Impression . . . . . . . The patient is tolerating radiation. Plan . . . . . . . . . . . . Continue treatment as planned.  ________________________________   Blair Promise, PhD, MD

## 2014-03-13 NOTE — Progress Notes (Addendum)
Weekly rad txs sternum 5/8, no skin changes, appetite fair, no nausea, no difficulty swallowing, no coughing today, occasional phelgm from brownish to clear stated, energy level  Poor, using cane and yellow safety fall bracelet,  No c/o sob, 96% room air, blood sugar today 159 finger stick 2:23 PM

## 2014-03-14 ENCOUNTER — Ambulatory Visit
Admission: RE | Admit: 2014-03-14 | Discharge: 2014-03-14 | Disposition: A | Discharge status: Home / Self Care | Payer: Medicare HMO | Source: Ambulatory Visit | Attending: Radiation Oncology | Admitting: Radiation Oncology

## 2014-03-14 DIAGNOSIS — Z510 Encounter for antineoplastic radiation therapy: Secondary | ICD-10-CM | POA: Diagnosis not present

## 2014-03-15 ENCOUNTER — Ambulatory Visit
Admission: RE | Admit: 2014-03-15 | Discharge: 2014-03-15 | Disposition: A | Discharge status: Home / Self Care | Payer: Medicare HMO | Source: Ambulatory Visit | Attending: Radiation Oncology | Admitting: Radiation Oncology

## 2014-03-15 ENCOUNTER — Other Ambulatory Visit: Payer: Medicare HMO | Admitting: Lab

## 2014-03-15 ENCOUNTER — Ambulatory Visit: Payer: Medicare HMO

## 2014-03-15 DIAGNOSIS — Z510 Encounter for antineoplastic radiation therapy: Secondary | ICD-10-CM | POA: Diagnosis not present

## 2014-03-16 ENCOUNTER — Ambulatory Visit
Admission: RE | Admit: 2014-03-16 | Discharge: 2014-03-16 | Disposition: A | Discharge status: Home / Self Care | Payer: Medicare HMO | Source: Ambulatory Visit | Attending: Radiation Oncology | Admitting: Radiation Oncology

## 2014-03-16 ENCOUNTER — Encounter: Payer: Self-pay | Admitting: Radiation Oncology

## 2014-03-16 ENCOUNTER — Ambulatory Visit: Payer: Medicare HMO

## 2014-03-16 VITALS — BP 132/86 | HR 97 | Temp 98.6°F | Resp 20 | Wt <= 1120 oz

## 2014-03-16 DIAGNOSIS — C9002 Multiple myeloma in relapse: Secondary | ICD-10-CM

## 2014-03-16 DIAGNOSIS — Z510 Encounter for antineoplastic radiation therapy: Secondary | ICD-10-CM | POA: Diagnosis not present

## 2014-03-16 NOTE — Progress Notes (Signed)
Weekly rad tsx sternum 8/8 completed, no c/o pain, nausea, does get sob with exertion, coughs up clear sputum some stated, appetite good, energy level low stated, 1 month follow up appt card given 3:07 PM

## 2014-03-16 NOTE — Progress Notes (Signed)
  Radiation Oncology         [phone] ________________________________  Name: Arthur Valdez MRN: [id number]  Date: 03/16/2014  DOB: 11/30/1932  Weekly Radiation Therapy Management  Current Dose: 24 Gy     Planned Dose:  24 Gy  Narrative . . . . . . . . The patient presents for the final under treatment assessment.                                            The patient has noted significant improvement in sternal pain.                                 Set-up films were reviewed.                                 The chart was checked. Physical Findings. . . Weight essentially stable.  No significant changes. Impression . . . . . . . The patient tolerated radiation relatively well. Plan . . . . . . . . . . . . Complete radiation today as scheduled, and follow-up in one month. The patient was encouraged to call or return to the clinic in the interim for any worsening symptoms.  ________________________________  Sheral Apley Tammi Klippel, M.D.

## 2014-03-19 ENCOUNTER — Other Ambulatory Visit: Payer: Self-pay | Admitting: Hematology & Oncology

## 2014-03-19 ENCOUNTER — Ambulatory Visit: Payer: Medicare HMO

## 2014-03-19 DIAGNOSIS — C9002 Multiple myeloma in relapse: Secondary | ICD-10-CM

## 2014-03-19 MED ORDER — PANOBINOSTAT LACTATE 20 MG PO CAPS: 20.0000 mg | ORAL_CAPSULE | ORAL | Status: DC

## 2014-03-20 ENCOUNTER — Telehealth: Payer: Self-pay | Admitting: Hematology & Oncology

## 2014-03-20 ENCOUNTER — Ambulatory Visit: Payer: Medicare HMO

## 2014-03-20 NOTE — Telephone Encounter (Signed)
Patient called and cx 03/21/14 apt and stated he will come to his next sch apt 03/28/14

## 2014-03-20 NOTE — Telephone Encounter (Signed)
Faxed Medical Records via fax today  to:  [employer] Dept Attn: Bo Mcclintock, RN, BA - Case Mgr Ph: [phone] ext [phone] Fx: [phone]   [id number]  Medical  Records or complete form. MR sent, instead   CONSENT COPY SCANNED

## 2014-03-21 ENCOUNTER — Ambulatory Visit: Payer: Medicare HMO

## 2014-03-21 ENCOUNTER — Other Ambulatory Visit: Payer: Medicare HMO | Admitting: Lab

## 2014-03-22 ENCOUNTER — Other Ambulatory Visit: Payer: Medicare HMO | Admitting: Lab

## 2014-03-22 ENCOUNTER — Ambulatory Visit: Payer: Medicare HMO

## 2014-03-23 ENCOUNTER — Ambulatory Visit: Payer: Medicare HMO

## 2014-03-26 ENCOUNTER — Telehealth: Payer: Self-pay | Admitting: Hematology & Oncology

## 2014-03-26 NOTE — Telephone Encounter (Signed)
Patient Status:Approval for Multiple Myeloma  Available Balance:$10000.00  Enrollment Start Date:03/26/2014  Enrollment End Date:03/26/2015   NAME:Arthur Valdez  [id number]  [id number]  [id number]    [place]

## 2014-03-26 NOTE — Telephone Encounter (Signed)
[employer] MED[employer] has APPROVED the FARYDAK 20MG  CAP.   Faxed Rx to [facility] @ [phone]

## 2014-03-28 ENCOUNTER — Other Ambulatory Visit: Payer: Self-pay | Admitting: *Deleted

## 2014-03-28 ENCOUNTER — Encounter: Payer: Self-pay | Admitting: Radiation Oncology

## 2014-03-28 ENCOUNTER — Encounter: Payer: Self-pay | Admitting: Hematology & Oncology

## 2014-03-28 ENCOUNTER — Other Ambulatory Visit (HOSPITAL_BASED_OUTPATIENT_CLINIC_OR_DEPARTMENT_OTHER): Payer: Medicare HMO | Admitting: Lab

## 2014-03-28 ENCOUNTER — Ambulatory Visit (HOSPITAL_BASED_OUTPATIENT_CLINIC_OR_DEPARTMENT_OTHER): Payer: Medicare HMO

## 2014-03-28 VITALS — BP 122/79 | HR 100 | Temp 96.6°F | Resp 100 | Wt 206.0 lb

## 2014-03-28 DIAGNOSIS — Z5112 Encounter for antineoplastic immunotherapy: Secondary | ICD-10-CM

## 2014-03-28 DIAGNOSIS — C9002 Multiple myeloma in relapse: Secondary | ICD-10-CM

## 2014-03-28 LAB — CMP (CANCER CENTER ONLY)
ALK PHOS: 49 U/L (ref 26–84)
ALT: 9 U/L — AB (ref 10–47)
AST: 15 U/L (ref 11–38)
Albumin: 3.1 g/dL — ABNORMAL LOW (ref 3.3–5.5)
BILIRUBIN TOTAL: 0.7 mg/dL (ref 0.20–1.60)
BUN, Bld: 15 mg/dL (ref 7–22)
CO2: 25 meq/L (ref 18–33)
Calcium: 8 mg/dL (ref 8.0–10.3)
Chloride: 101 mEq/L (ref 98–108)
Creat: 1.7 mg/dl — ABNORMAL HIGH (ref 0.6–1.2)
Glucose, Bld: 163 mg/dL — ABNORMAL HIGH (ref 73–118)
Potassium: 3.1 mEq/L — ABNORMAL LOW (ref 3.3–4.7)
Sodium: 137 mEq/L (ref 128–145)
Total Protein: 6.2 g/dL — ABNORMAL LOW (ref 6.4–8.1)

## 2014-03-28 LAB — CBC WITH DIFFERENTIAL (CANCER CENTER ONLY)
BASO#: 0 10*3/uL (ref 0.0–0.2)
BASO%: 0.4 % (ref 0.0–2.0)
EOS%: 1.9 % (ref 0.0–7.0)
Eosinophils Absolute: 0.1 10*3/uL (ref 0.0–0.5)
HCT: 27.3 % — ABNORMAL LOW (ref 38.7–49.9)
HGB: 8.8 g/dL — ABNORMAL LOW (ref 13.0–17.1)
LYMPH#: 0.8 10*3/uL — AB (ref 0.9–3.3)
LYMPH%: 14.7 % (ref 14.0–48.0)
MCH: 29.8 pg (ref 28.0–33.4)
MCHC: 32.2 g/dL (ref 32.0–35.9)
MCV: 93 fL (ref 82–98)
MONO#: 0.6 10*3/uL (ref 0.1–0.9)
MONO%: 11.7 % (ref 0.0–13.0)
NEUT%: 71.3 % (ref 40.0–80.0)
NEUTROS ABS: 3.8 10*3/uL (ref 1.5–6.5)
PLATELETS: 188 10*3/uL (ref 145–400)
RBC: 2.95 10*6/uL — AB (ref 4.20–5.70)
RDW: 17.1 % — ABNORMAL HIGH (ref 11.1–15.7)
WBC: 5.3 10*3/uL (ref 4.0–10.0)

## 2014-03-28 LAB — TECHNOLOGIST REVIEW CHCC SATELLITE

## 2014-03-28 MED ORDER — HEPARIN SOD (PORK) LOCK FLUSH 100 UNIT/ML IV SOLN
500.0000 [IU] | Freq: Once | INTRAVENOUS | Status: AC | PRN
  Administered 2014-03-28: 500 [IU] via INTRAVENOUS
  Filled 2014-03-28: qty 5

## 2014-03-28 MED ORDER — PROCHLORPERAZINE MALEATE 10 MG PO TABS: 10.0000 mg | ORAL_TABLET | Freq: Four times a day (QID) | ORAL | Status: DC | PRN

## 2014-03-28 MED ORDER — ONDANSETRON HCL 8 MG PO TABS
ORAL_TABLET | ORAL | Status: AC
  Filled 2014-03-28: qty 1

## 2014-03-28 MED ORDER — SODIUM CHLORIDE 0.9 % IJ SOLN
10.0000 mL | INTRAMUSCULAR | Status: DC | PRN
  Administered 2014-03-28: 10 mL via INTRAVENOUS
  Filled 2014-03-28: qty 10

## 2014-03-28 MED ORDER — BORTEZOMIB CHEMO SQ INJECTION 3.5 MG (2.5MG/ML)
1.3000 mg/m2 | Freq: Once | INTRAMUSCULAR | Status: AC
  Administered 2014-03-28: 2.75 mg via SUBCUTANEOUS
  Filled 2014-03-28: qty 2.75

## 2014-03-28 MED ORDER — SODIUM CHLORIDE 0.9 % IV SOLN
3.5000 mg | Freq: Once | INTRAVENOUS | Status: AC
  Administered 2014-03-28: 3.5 mg via INTRAVENOUS
  Filled 2014-03-28: qty 4.38

## 2014-03-28 MED ORDER — ONDANSETRON HCL 8 MG PO TABS
8.0000 mg | ORAL_TABLET | Freq: Once | ORAL | Status: AC
  Administered 2014-03-28: 8 mg via ORAL

## 2014-03-28 MED ORDER — ONDANSETRON HCL 8 MG PO TABS: 8.0000 mg | ORAL_TABLET | Freq: Two times a day (BID) | ORAL | Status: DC

## 2014-03-28 MED ORDER — SODIUM CHLORIDE 0.9 % IV SOLN
INTRAVENOUS | Status: DC
  Administered 2014-03-28: 10:00:00 via INTRAVENOUS

## 2014-03-28 NOTE — Patient Instructions (Addendum)
Nausea medication -prochlorperazine (COMPAZINE) 10 MG tablet 30 tablet : Take 1 tablet (10 mg total) by mouth every 6 (six) hours as needed (Nausea or vomiting).   -ondansetron (ZOFRAN) 8 MG tablet 30 tablet : Take 1 tablet (8 mg total) by mouth 2 (two) times daily. Start the day after chemo for 2 days. Then take as needed for nausea or vomiting.   Bortezomib injection What is this medicine? BORTEZOMIB (bor TEZ oh mib) is a chemotherapy drug. It slows the growth of cancer cells. This medicine is used to treat multiple myeloma, and certain lymphomas, such as mantle-cell lymphoma. This medicine may be used for other purposes; ask your health care provider or pharmacist if you have questions. COMMON BRAND NAME(S): Velcade What should I tell my health care provider before I take this medicine? They need to know if you have any of these conditions: -diabetes -heart disease -irregular heartbeat -liver disease -on hemodialysis -low blood counts, like low white blood cells, platelets, or hemoglobin -peripheral neuropathy -taking medicine for blood pressure -an unusual or allergic reaction to bortezomib, mannitol, boron, other medicines, foods, dyes, or preservatives -pregnant or trying to get pregnant -breast-feeding How should I use this medicine? This medicine is for injection into a vein or for injection under the skin. It is given by a health care professional in a hospital or clinic setting. Talk to your pediatrician regarding the use of this medicine in children. Special care may be needed. Overdosage: If you think you have taken too much of this medicine contact a poison control center or emergency room at once. NOTE: This medicine is only for you. Do not share this medicine with others. What if I miss a dose? It is important not to miss your dose. Call your doctor or health care professional if you are unable to keep an appointment. What may interact with this medicine? This medicine  may interact with the following medications: -ketoconazole -rifampin -ritonavir -St. John's Wort This list may not describe all possible interactions. Give your health care provider a list of all the medicines, herbs, non-prescription drugs, or dietary supplements you use. Also tell them if you smoke, drink alcohol, or use illegal drugs. Some items may interact with your medicine. What should I watch for while using this medicine? Visit your doctor for checks on your progress. This drug may make you feel generally unwell. This is not uncommon, as chemotherapy can affect healthy cells as well as cancer cells. Report any side effects. Continue your course of treatment even though you feel ill unless your doctor tells you to stop. You may get drowsy or dizzy. Do not drive, use machinery, or do anything that needs mental alertness until you know how this medicine affects you. Do not stand or sit up quickly, especially if you are an older patient. This reduces the risk of dizzy or fainting spells. In some cases, you may be given additional medicines to help with side effects. Follow all directions for their use. Call your doctor or health care professional for advice if you get a fever, chills or sore throat, or other symptoms of a cold or flu. Do not treat yourself. This drug decreases your body's ability to fight infections. Try to avoid being around people who are sick. This medicine may increase your risk to bruise or bleed. Call your doctor or health care professional if you notice any unusual bleeding. You may need blood work done while you are taking this medicine. In some patients, this  medicine may cause a serious brain infection that may cause death. If you have any problems seeing, thinking, speaking, walking, or standing, tell your doctor right away. If you cannot reach your doctor, urgently seek other source of medical care. Do not become pregnant while taking this medicine. Women should inform  their doctor if they wish to become pregnant or think they might be pregnant. There is a potential for serious side effects to an unborn child. Talk to your health care professional or pharmacist for more information. Do not breast-feed an infant while taking this medicine. Check with your doctor or health care professional if you get an attack of severe diarrhea, nausea and vomiting, or if you sweat a lot. The loss of too much body fluid can make it dangerous for you to take this medicine. What side effects may I notice from receiving this medicine? Side effects that you should report to your doctor or health care professional as soon as possible: -allergic reactions like skin rash, itching or hives, swelling of the face, lips, or tongue -breathing problems -changes in hearing -changes in vision -fast, irregular heartbeat -feeling faint or lightheaded, falls -pain, tingling, numbness in the hands or feet -right upper belly pain -seizures -swelling of the ankles, feet, hands -unusual bleeding or bruising -unusually weak or tired -vomiting -yellowing of the eyes or skin Side effects that usually do not require medical attention (report to your doctor or health care professional if they continue or are bothersome): -changes in emotions or moods -constipation -diarrhea -loss of appetite -headache -irritation at site where injected -nausea This list may not describe all possible side effects. Call your doctor for medical advice about side effects. You may report side effects to FDA at 1-800-FDA-1088. Where should I keep my medicine? This drug is given in a hospital or clinic and will not be stored at home. NOTE: This sheet is a summary. It may not cover all possible information. If you have questions about this medicine, talk to your doctor, pharmacist, or health care provider.  2015, Elsevier/Gold Standard. (2013-05-29 12:46:32)   -prochlorperazine (COMPAZINE) 10 MG tablet 30 tablet :  Take 1 tablet (10 mg total) by mouth every 6 (six) hours as needed (Nausea or vomiting).    -ondansetron (ZOFRAN) 8 MG tablet 30 tablet : Take 1 tablet (8 mg total) by mouth 2 (two) times daily. Start the day after chemo for 2 days. Then take as needed for nausea or vomiting.

## 2014-03-28 NOTE — Progress Notes (Signed)
  Radiation Oncology         [phone] ________________________________  Name: Arthur Valdez MRN: [id number]  Date: 03/28/2014  DOB: Sep 08, 1932  End of Treatment Note  Diagnosis:   Multiple myeloma in relapse   Indication for treatment:  Sternal pain       Radiation treatment dates:   July 22 through July 31  Site/dose:   Lower sternal region 24 gray in 8 fractions  Beams/energy:   Three-field setup using wedged pair AP, 6 in the photons  Narrative: The patient tolerated radiation treatment relatively well.   He noticed significant improvement in his pain in the lower anterior chest region  Plan: The patient has completed radiation treatment. The patient will return to radiation oncology clinic for routine followup in one month. I advised them to call or return sooner if they have any questions or concerns related to their recovery or treatment.  -----------------------------------  Blair Promise, PhD, MD

## 2014-03-29 ENCOUNTER — Other Ambulatory Visit: Payer: Medicare HMO | Admitting: Lab

## 2014-03-29 ENCOUNTER — Ambulatory Visit: Payer: Medicare HMO

## 2014-03-29 LAB — KAPPA/LAMBDA LIGHT CHAINS
Kappa free light chain: 0.71 mg/dL (ref 0.33–1.94)
Kappa:Lambda Ratio: 0 — ABNORMAL LOW (ref 0.26–1.65)
Lambda Free Lght Chn: 575 mg/dL — ABNORMAL HIGH (ref 0.57–2.63)

## 2014-03-29 LAB — IGG, IGA, IGM
IGG (IMMUNOGLOBIN G), SERUM: 189 mg/dL — AB (ref 650–1600)
IgA: 8 mg/dL — ABNORMAL LOW (ref 68–379)
IgM, Serum: 7 mg/dL — ABNORMAL LOW (ref 41–251)

## 2014-03-29 LAB — HOLD TUBE, BLOOD BANK - CHCC SATELLITE

## 2014-03-30 ENCOUNTER — Ambulatory Visit: Payer: Medicare HMO

## 2014-04-05 ENCOUNTER — Telehealth: Payer: Self-pay | Admitting: Hematology & Oncology

## 2014-04-05 ENCOUNTER — Encounter: Payer: Self-pay | Admitting: Hematology & Oncology

## 2014-04-05 ENCOUNTER — Ambulatory Visit (HOSPITAL_BASED_OUTPATIENT_CLINIC_OR_DEPARTMENT_OTHER): Payer: Medicare HMO | Admitting: Hematology & Oncology

## 2014-04-05 ENCOUNTER — Ambulatory Visit: Payer: Medicare HMO

## 2014-04-05 ENCOUNTER — Ambulatory Visit (HOSPITAL_BASED_OUTPATIENT_CLINIC_OR_DEPARTMENT_OTHER): Payer: Medicare HMO | Admitting: Lab

## 2014-04-05 VITALS — BP 129/75 | HR 102 | Temp 98.0°F | Resp 20 | Ht 71.0 in | Wt 201.0 lb

## 2014-04-05 DIAGNOSIS — C9002 Multiple myeloma in relapse: Secondary | ICD-10-CM

## 2014-04-05 LAB — CBC WITH DIFFERENTIAL (CANCER CENTER ONLY)
BASO#: 0 10*3/uL (ref 0.0–0.2)
BASO%: 0.2 % (ref 0.0–2.0)
EOS%: 1.4 % (ref 0.0–7.0)
Eosinophils Absolute: 0.1 10*3/uL (ref 0.0–0.5)
HCT: 27.8 % — ABNORMAL LOW (ref 38.7–49.9)
HEMOGLOBIN: 9 g/dL — AB (ref 13.0–17.1)
LYMPH#: 0.9 10*3/uL (ref 0.9–3.3)
LYMPH%: 17.9 % (ref 14.0–48.0)
MCH: 30 pg (ref 28.0–33.4)
MCHC: 32.4 g/dL (ref 32.0–35.9)
MCV: 93 fL (ref 82–98)
MONO#: 0.7 10*3/uL (ref 0.1–0.9)
MONO%: 13.6 % — AB (ref 0.0–13.0)
NEUT#: 3.4 10*3/uL (ref 1.5–6.5)
NEUT%: 66.9 % (ref 40.0–80.0)
Platelets: 134 10*3/uL — ABNORMAL LOW (ref 145–400)
RBC: 3 10*6/uL — AB (ref 4.20–5.70)
RDW: 16.9 % — ABNORMAL HIGH (ref 11.1–15.7)
WBC: 5.1 10*3/uL (ref 4.0–10.0)

## 2014-04-05 LAB — CMP (CANCER CENTER ONLY)
ALK PHOS: 56 U/L (ref 26–84)
ALT: 12 U/L (ref 10–47)
AST: 16 U/L (ref 11–38)
Albumin: 3.2 g/dL — ABNORMAL LOW (ref 3.3–5.5)
BILIRUBIN TOTAL: 0.9 mg/dL (ref 0.20–1.60)
BUN, Bld: 19 mg/dL (ref 7–22)
CALCIUM: 9.1 mg/dL (ref 8.0–10.3)
CHLORIDE: 105 meq/L (ref 98–108)
CO2: 25 mEq/L (ref 18–33)
Creat: 1.6 mg/dl — ABNORMAL HIGH (ref 0.6–1.2)
Glucose, Bld: 160 mg/dL — ABNORMAL HIGH (ref 73–118)
Potassium: 3.1 mEq/L — ABNORMAL LOW (ref 3.3–4.7)
Sodium: 143 mEq/L (ref 128–145)
Total Protein: 6.5 g/dL (ref 6.4–8.1)

## 2014-04-05 LAB — TECHNOLOGIST REVIEW CHCC SATELLITE

## 2014-04-05 MED ORDER — POTASSIUM CHLORIDE CRYS ER 20 MEQ PO TBCR: EXTENDED_RELEASE_TABLET | ORAL | Status: DC

## 2014-04-05 NOTE — Progress Notes (Signed)
Per pt and Dr Marin Olp, pt will not receive treatment today.

## 2014-04-05 NOTE — Telephone Encounter (Signed)
Left message with 9-8 appointment

## 2014-04-06 LAB — IGG, IGA, IGM
IGG (IMMUNOGLOBIN G), SERUM: 202 mg/dL — AB (ref 650–1600)
IGM, SERUM: 21 mg/dL — AB (ref 41–251)
IgA: 10 mg/dL — ABNORMAL LOW (ref 68–379)

## 2014-04-06 LAB — KAPPA/LAMBDA LIGHT CHAINS
Kappa free light chain: 0.78 mg/dL (ref 0.33–1.94)
Kappa:Lambda Ratio: 0 — ABNORMAL LOW (ref 0.26–1.65)
LAMBDA FREE LGHT CHN: 641 mg/dL — AB (ref 0.57–2.63)

## 2014-04-06 NOTE — Progress Notes (Signed)
Hematology and Oncology Follow Up Visit  MASAO JUNKER [id number] 02-03-1933 80 y.o. 04/06/2014   Principle Diagnosis:   Refractory lambda light chain myeloma  Current Therapy:    Velcade with Janey Greaser  Zometa 3 mg IV every month  Supportive transfusions     Interim History:  Mr.  Senkbeil is back for followup. Unfortunately, he continues to have problems. We started him recently on the Farydak. He just is having a hard time with this. He just is tired of feeling poorly. He wants to stop treatment.  Identifying can understand what he is going through. I want to have him get a better quality of life. It had stopped treatment on him then we certainly can do this.  I know that the new monoclonal antibody just had a positive clinical trial. I just don't know when the FDA will approve this.  He got radiation for somewhat bony metastasis. This helped quite a bit.  His appetite is marginal. He's lost 5 pounds since. Saw him a couple weeks ago.  He's had no problems with his blood sugars.  His bowels and bladder have been doing okay.  His last lambda light chain was up to 554 mg/dL.  Currently, his performance status is ECOG 2.  Medications: Current outpatient prescriptions:albuterol (PROVENTIL HFA) 108 (90 BASE) MCG/ACT inhaler, Inhale 2 puffs into the lungs every 4 (four) hours as needed., Disp: 2 Inhaler, Rfl: 3;  amLODipine (NORVASC) 10 MG tablet, Take 10 mg by mouth., Disp: , Rfl: ;  B Complex-C (B-COMPLEX WITH VITAMIN C) tablet, Take 1 tablet by mouth daily., Disp: , Rfl:  clotrimazole (MYCELEX) 10 MG troche, Take 1 tablet (10 mg total) by mouth 3 (three) times daily as needed. Mouth sore, Disp: 90 tablet, Rfl: 0;  docusate sodium (STOOL SOFTENER) 100 MG capsule, Take 100 mg by mouth., Disp: , Rfl: ;  fentaNYL (DURAGESIC) 12 MCG/HR, Place 1 patch (12.5 mcg total) onto the skin every 3 (three) days., Disp: 10 patch, Rfl: 0 HYDROmorphone (DILAUDID) 2 MG tablet, Take 1 tablet (2 mg total)  by mouth every 6 (six) hours as needed for moderate pain or severe pain., Disp: 120 tablet, Rfl: 0;  insulin glargine (LANTUS) 100 UNIT/ML injection, Inject 5 Units into the skin daily as needed. Just take it when sugar is up per patient, Disp: , Rfl: ;  levothyroxine (SYNTHROID) 50 MCG tablet, Take by mouth., Disp: , Rfl:  lidocaine (LIDODERM) 5 %, Place 1 patch onto the skin daily. Remove & Discard patch within 12 hours or as directed by MD, Disp: , Rfl: ;  loperamide (IMODIUM) 2 MG capsule, Take 1 capsule (2 mg total) by mouth as needed for diarrhea or loose stools., Disp: 30 capsule, Rfl: 0;  LORazepam (ATIVAN) 2 MG tablet, Take 1 tablet (2 mg total) by mouth every 8 (eight) hours as needed for anxiety., Disp: 90 tablet, Rfl: 1 omeprazole (PRILOSEC) 20 MG capsule, Take 20 mg by mouth 2 (two) times daily. , Disp: , Rfl: ;  Omeprazole (RA OMEPRAZOLE) 20 MG TBEC, Take 40 mg by mouth., Disp: , Rfl: ;  ondansetron (ZOFRAN) 8 MG tablet, Take 1 tablet (8 mg total) by mouth 2 (two) times daily. Take 1 tablet by mouth (8 mg ) in the am and one tablet by mouth in the pm beginning day 3 or Wednesday, 05/18/12 for 3 days total., Disp: 20 tablet, Rfl: 3 oxyCODONE (ROXICODONE) 5 MG immediate release tablet, Take 1 tablet (5 mg total) by mouth every 4 (  four) hours as needed for pain., Disp: 40 tablet, Rfl: 0;  panobinostat lactate (FARYDAK) 20 MG capsule, Take 1 capsule (20 mg total) by mouth as directed. Swallow whole., Disp: 6 capsule, Rfl: 4;  prochlorperazine (COMPAZINE) 10 MG tablet, Take 1 tablet (10 mg total) by mouth every 6 (six) hours as needed (Nausea or vomiting)., Disp: 30 tablet, Rfl: 1 promethazine (PHENERGAN) 25 MG tablet, Take 0.5 tablets (12.5 mg total) by mouth every 6 (six) hours as needed for nausea., Disp: 30 tablet, Rfl: 0;  ranitidine (ZANTAC) 150 MG capsule, Take 150 mg by mouth at bedtime., Disp: , Rfl: ;  sitaGLIPtin (JANUVIA) 100 MG tablet, Take 1 tablet (100 mg total) by mouth daily before  breakfast., Disp: 30 tablet, Rfl: 0 traMADol (ULTRAM) 50 MG tablet, TAKE 1 TO 2 TABLETS EVERY 6 TO 8 HOURS AS NEEDED FOR PAIN, Disp: 120 tablet, Rfl: 0;  potassium chloride SA (K-DUR,KLOR-CON) 20 MEQ tablet, Take 1 pill a day, Disp: 30 tablet, Rfl: 3 No current facility-administered medications for this visit. Facility-Administered Medications Ordered in Other Visits: sodium chloride 0.9 % injection 10 mL, 10 mL, Intravenous, PRN, Volanda Napoleon, MD, 10 mL at 07/16/11 1002  Allergies:  Allergies  Allergen Reactions  . Raspberry     hives    Past Medical History, Surgical history, Social history, and Family History were reviewed and updated.  Review of Systems: As above  Physical Exam:  height is 5\' 11"  (1.803 m) and weight is 201 lb (91.173 kg). His oral temperature is 98 F (36.7 C). His blood pressure is 129/75 and his pulse is 102. His respiration is 20.   Slightly ill-appearing African American male. Head and neck exam shows no ocular or oral lesions. He has no thrush. There is no adenopathy in the neck. Lungs are clear. Cardiac exam regular in rhythm. Has a 1/6 systolic murmur. Abdomen is soft. She has good bowel sounds. There is no fluid wave. There is no palpable liver or spleen tip. Back exam shows no tenderness over the spine ribs or hips. Extremities shows no clubbing, cyanosis or edema. Skin exam shows no rashes, it was or petechia. Neurological exam is nonfocal.  Lab Results  Component Value Date   WBC 5.1 04/05/2014   HGB 9.0* 04/05/2014   HCT 27.8* 04/05/2014   MCV 93 04/05/2014   PLT 134* 04/05/2014     Chemistry      Component Value Date/Time   NA 143 04/05/2014 0940   NA 140 12/14/2013 1302   K 3.1* 04/05/2014 0940   K 3.6 12/14/2013 1302   CL 105 04/05/2014 0940   CL 109 12/14/2013 1302   CO2 25 04/05/2014 0940   CO2 22 12/14/2013 1302   BUN 19 04/05/2014 0940   BUN 17 12/14/2013 1302   CREATININE 1.6* 04/05/2014 0940   CREATININE 1.22 12/14/2013 1302      Component  Value Date/Time   CALCIUM 9.1 04/05/2014 0940   CALCIUM 8.0* 12/14/2013 1302   ALKPHOS 56 04/05/2014 0940   ALKPHOS 53 12/14/2013 1302   AST 16 04/05/2014 0940   AST 12 12/14/2013 1302   ALT 12 04/05/2014 0940   ALT <8 12/14/2013 1302   BILITOT 0.90 04/05/2014 0940   BILITOT 0.5 12/14/2013 1302      Lambda light chain is now 641 mg/dL.   Impression and Plan: Mr. Gallier is 80 year old abdomen with refractory lambda light chain myeloma. She has been through numerous lines of therapy. He has had a  transplant. He has been on clinical trials.  He just is getting tired of being treated and is wants some quality of life.  We will try to see about getting the monoclonal antibody for him. I think this probably would help him out. I think toxicity would be minimal.  We have to make sure that he is not anemic. We'll have to continue the Zometa.  I spent a good 45 minutes with him today. I had a very long talk with him. I have known Mr. Spease for him a long time. I certainly appreciate his honesty with respect to what he would like.  Am going to have to talk to him about end-of-life issues and thought about his desires for of life support.  I want to see him back in another couple weeks.   Volanda Napoleon, MD 8/21/20154:36 PM

## 2014-04-11 ENCOUNTER — Encounter: Payer: Self-pay | Admitting: Oncology

## 2014-04-12 ENCOUNTER — Ambulatory Visit
Admission: RE | Admit: 2014-04-12 | Discharge: 2014-04-12 | Disposition: A | Discharge status: Home / Self Care | Payer: Medicare HMO | Source: Ambulatory Visit | Attending: Radiation Oncology | Admitting: Radiation Oncology

## 2014-04-12 ENCOUNTER — Other Ambulatory Visit: Payer: Self-pay | Admitting: *Deleted

## 2014-04-12 ENCOUNTER — Encounter: Payer: Self-pay | Admitting: Radiation Oncology

## 2014-04-12 VITALS — BP 150/85 | HR 101 | Temp 98.6°F | Resp 16 | Ht 71.0 in | Wt 204.5 lb

## 2014-04-12 DIAGNOSIS — C9002 Multiple myeloma in relapse: Secondary | ICD-10-CM

## 2014-04-12 MED ORDER — SITAGLIPTIN PHOSPHATE 100 MG PO TABS: 100.0000 mg | ORAL_TABLET | Freq: Every day | ORAL | Status: DC

## 2014-04-12 NOTE — Progress Notes (Signed)
Arthur Valdez here for follow up after treatment to his lower sternal region.  He reports pain in his right lower side at a 5/10.  He is taking oxycodone as needed.  He denies pain in his sternal region except when he coughs he reports feeling sore in his left ribs area.  He reports an occasional cough.  He denies trouble swallowing and skin irritation.  He reports fatigue.

## 2014-04-12 NOTE — Progress Notes (Signed)
Radiation Oncology         ([phone]) [phone] ________________________________  Name: Arthur Valdez MRN: [id number]  Date: 04/12/2014  DOB: July 20, 1933  Follow-Up Visit Note  CC: Kennon Portela, MD  Orma Flaming, MD  Diagnosis:   Multiple myeloma in relapse    Interval Since Last Radiation:  1  months  Narrative:  The patient returns today for routine follow-up.  His pain in the sternal area is much better. He does have occasional pain in the lower rib cage area. Patient will be starting a new medication in the near future.                              ALLERGIES:  is allergic to raspberry.  Meds: Current Outpatient Prescriptions  Medication Sig Dispense Refill  . albuterol (PROVENTIL HFA) 108 (90 BASE) MCG/ACT inhaler Inhale 2 puffs into the lungs every 4 (four) hours as needed.  2 Inhaler  3  . amLODipine (NORVASC) 10 MG tablet Take 10 mg by mouth.      . B Complex-C (B-COMPLEX WITH VITAMIN C) tablet Take 1 tablet by mouth daily.      . clotrimazole (MYCELEX) 10 MG troche Take 1 tablet (10 mg total) by mouth 3 (three) times daily as needed. Mouth sore  90 tablet  0  . docusate sodium (STOOL SOFTENER) 100 MG capsule Take 100 mg by mouth.      . insulin glargine (LANTUS) 100 UNIT/ML injection Inject 5 Units into the skin daily as needed. Just take it when sugar is up per patient      . levothyroxine (SYNTHROID) 50 MCG tablet Take by mouth.      . lidocaine (LIDODERM) 5 % Place 1 patch onto the skin daily. Remove & Discard patch within 12 hours or as directed by MD      . loperamide (IMODIUM) 2 MG capsule Take 1 capsule (2 mg total) by mouth as needed for diarrhea or loose stools.  30 capsule  0  . LORazepam (ATIVAN) 2 MG tablet Take 1 tablet (2 mg total) by mouth every 8 (eight) hours as needed for anxiety.  90 tablet  1  . omeprazole (PRILOSEC) 20 MG capsule Take 20 mg by mouth 2 (two) times daily.       . Omeprazole (RA OMEPRAZOLE) 20 MG TBEC Take 40 mg by mouth.      .  ondansetron (ZOFRAN) 8 MG tablet Take 1 tablet (8 mg total) by mouth 2 (two) times daily. Take 1 tablet by mouth (8 mg ) in the am and one tablet by mouth in the pm beginning day 3 or Wednesday, 05/18/12 for 3 days total.  20 tablet  3  . oxyCODONE (ROXICODONE) 5 MG immediate release tablet Take 1 tablet (5 mg total) by mouth every 4 (four) hours as needed for pain.  40 tablet  0  . prochlorperazine (COMPAZINE) 10 MG tablet Take 1 tablet (10 mg total) by mouth every 6 (six) hours as needed (Nausea or vomiting).  30 tablet  1  . promethazine (PHENERGAN) 25 MG tablet Take 0.5 tablets (12.5 mg total) by mouth every 6 (six) hours as needed for nausea.  30 tablet  0  . sitaGLIPtin (JANUVIA) 100 MG tablet Take 1 tablet (100 mg total) by mouth daily before breakfast.  30 tablet  0  . traMADol (ULTRAM) 50 MG tablet TAKE 1 TO 2 TABLETS EVERY 6 TO  8 HOURS AS NEEDED FOR PAIN  120 tablet  0  . fentaNYL (DURAGESIC) 12 MCG/HR Place 1 patch (12.5 mcg total) onto the skin every 3 (three) days.  10 patch  0  . HYDROmorphone (DILAUDID) 2 MG tablet Take 1 tablet (2 mg total) by mouth every 6 (six) hours as needed for moderate pain or severe pain.  120 tablet  0  . panobinostat lactate (FARYDAK) 20 MG capsule Take 1 capsule (20 mg total) by mouth as directed. Swallow whole.  6 capsule  4  . potassium chloride SA (K-DUR,KLOR-CON) 20 MEQ tablet Take 1 pill a day  30 tablet  3  . ranitidine (ZANTAC) 150 MG capsule Take 150 mg by mouth at bedtime.       No current facility-administered medications for this encounter.   Facility-Administered Medications Ordered in Other Encounters  Medication Dose Route Frequency Provider Last Rate Last Dose  . sodium chloride 0.9 % injection 10 mL  10 mL Intravenous PRN Volanda Napoleon, MD   10 mL at 07/16/11 1002    Physical Findings: The patient is in no acute distress. Patient is alert and oriented.  height is '5\' 11"'  (1.803 m) and weight is 204 lb 8 oz (92.761 kg). His oral  temperature is 98.6 F (37 C). His blood pressure is 150/85 and his pulse is 101. His respiration is 16 and oxygen saturation is 98%. .  The lungs are clear. The heart has a regular rhythm and rate. The abdomen is soft and nontender with normal bowel sounds examination of the sternum area reveals no significant skin reaction. Patient does not have any significant tenderness in this area.  Lab Findings: Lab Results  Component Value Date   WBC 5.1 04/05/2014   HGB 9.0* 04/05/2014   HCT 27.8* 04/05/2014   MCV 93 04/05/2014   PLT 134* 04/05/2014     Radiographic Findings: No results found.  Impression:  The patient is recovering from the effects of radiation.  Satisfactory response to palliative radiation therapy  Plan:  When necessary followup in radiation oncology. Patient will continue close followup with medical oncology.  ____________________________________ Blair Promise, MD

## 2014-04-17 ENCOUNTER — Other Ambulatory Visit: Payer: Self-pay | Admitting: *Deleted

## 2014-04-17 DIAGNOSIS — C9002 Multiple myeloma in relapse: Secondary | ICD-10-CM

## 2014-04-17 MED ORDER — CLOTRIMAZOLE 10 MG MT TROC: 10.0000 mg | Freq: Three times a day (TID) | OROMUCOSAL | Status: DC | PRN

## 2014-04-24 ENCOUNTER — Ambulatory Visit: Payer: Medicare HMO

## 2014-04-24 ENCOUNTER — Ambulatory Visit (HOSPITAL_BASED_OUTPATIENT_CLINIC_OR_DEPARTMENT_OTHER): Payer: Medicare HMO | Admitting: Hematology & Oncology

## 2014-04-24 ENCOUNTER — Ambulatory Visit (HOSPITAL_COMMUNITY)
Admission: RE | Admit: 2014-04-24 | Discharge: 2014-04-24 | Disposition: A | Discharge status: Home / Self Care | Payer: Medicare HMO | Source: Ambulatory Visit | Attending: Hematology & Oncology | Admitting: Hematology & Oncology

## 2014-04-24 ENCOUNTER — Other Ambulatory Visit: Payer: Self-pay | Admitting: *Deleted

## 2014-04-24 ENCOUNTER — Ambulatory Visit (HOSPITAL_BASED_OUTPATIENT_CLINIC_OR_DEPARTMENT_OTHER): Payer: Medicare HMO | Admitting: Lab

## 2014-04-24 ENCOUNTER — Encounter: Payer: Self-pay | Admitting: Hematology & Oncology

## 2014-04-24 VITALS — BP 144/71 | HR 90 | Temp 97.5°F | Resp 20 | Ht 71.0 in | Wt 207.0 lb

## 2014-04-24 DIAGNOSIS — C9002 Multiple myeloma in relapse: Secondary | ICD-10-CM

## 2014-04-24 LAB — CMP (CANCER CENTER ONLY)
ALT: 9 U/L — AB (ref 10–47)
AST: 17 U/L (ref 11–38)
Albumin: 3 g/dL — ABNORMAL LOW (ref 3.3–5.5)
Alkaline Phosphatase: 48 U/L (ref 26–84)
BUN, Bld: 17 mg/dL (ref 7–22)
CALCIUM: 8.5 mg/dL (ref 8.0–10.3)
CHLORIDE: 108 meq/L (ref 98–108)
CO2: 25 mEq/L (ref 18–33)
Creat: 1.8 mg/dl — ABNORMAL HIGH (ref 0.6–1.2)
Glucose, Bld: 140 mg/dL — ABNORMAL HIGH (ref 73–118)
Potassium: 2.9 mEq/L — CL (ref 3.3–4.7)
SODIUM: 143 meq/L (ref 128–145)
TOTAL PROTEIN: 6 g/dL — AB (ref 6.4–8.1)
Total Bilirubin: 0.6 mg/dl (ref 0.20–1.60)

## 2014-04-24 LAB — CBC WITH DIFFERENTIAL (CANCER CENTER ONLY)
BASO#: 0 10*3/uL (ref 0.0–0.2)
BASO%: 0 % (ref 0.0–2.0)
EOS%: 1.8 % (ref 0.0–7.0)
Eosinophils Absolute: 0.1 10*3/uL (ref 0.0–0.5)
HCT: 22.4 % — ABNORMAL LOW (ref 38.7–49.9)
HEMOGLOBIN: 7.3 g/dL — AB (ref 13.0–17.1)
LYMPH#: 0.8 10*3/uL — AB (ref 0.9–3.3)
LYMPH%: 22.1 % (ref 14.0–48.0)
MCH: 31.2 pg (ref 28.0–33.4)
MCHC: 32.6 g/dL (ref 32.0–35.9)
MCV: 96 fL (ref 82–98)
MONO#: 0.3 10*3/uL (ref 0.1–0.9)
MONO%: 10 % (ref 0.0–13.0)
NEUT#: 2.2 10*3/uL (ref 1.5–6.5)
NEUT%: 66.1 % (ref 40.0–80.0)
Platelets: 139 10*3/uL — ABNORMAL LOW (ref 145–400)
RBC: 2.34 10*6/uL — ABNORMAL LOW (ref 4.20–5.70)
RDW: 17.9 % — ABNORMAL HIGH (ref 11.1–15.7)
WBC: 3.4 10*3/uL — ABNORMAL LOW (ref 4.0–10.0)

## 2014-04-24 LAB — HOLD TUBE, BLOOD BANK - CHCC SATELLITE

## 2014-04-24 LAB — TECHNOLOGIST REVIEW CHCC SATELLITE

## 2014-04-24 NOTE — Progress Notes (Signed)
Hematology and Oncology Follow Up Visit  BHARAT ANTILLON [id number] 12-25-1932 80 y.o. 04/24/2014   Principle Diagnosis:  Refractory lambda light chain myeloma  Current Therapy:    Observation  Supportive care with physicians as indicated     Interim History:  Mr.  Caples is back for followup. He is feeling a lot better. He does not feel as tired and fatigued. He really had a very difficult time with the new oral drugs-Farydak.Marland Kitchen He just had a tough time taking this. He has felt tired. He was nauseated. He had no energy at all.  He does feel a we'll target today. His hemoglobin 7.3. We will have to transfuse him.  His myeloma could is progressing. His last lambda light chain was 641 mg/dL.  He's had no problems urinating. He's had no problems bowels. He's had no leg swelling.  Overall, his performance status is ECOG 1.  His appetite is doing okay.  Am trying to see about getting a clinical trial out to our office. Is with one of the new monoclonal antibodies. Over, we will try to get him set up for this. Medications: Current outpatient prescriptions:albuterol (PROVENTIL HFA) 108 (90 BASE) MCG/ACT inhaler, Inhale 2 puffs into the lungs every 4 (four) hours as needed., Disp: 2 Inhaler, Rfl: 3;  amLODipine (NORVASC) 10 MG tablet, Take 10 mg by mouth., Disp: , Rfl: ;  B Complex-C (B-COMPLEX WITH VITAMIN C) tablet, Take 1 tablet by mouth daily., Disp: , Rfl:  clotrimazole (MYCELEX) 10 MG troche, Take 1 tablet (10 mg total) by mouth 3 (three) times daily as needed. Mouth sore, Disp: 90 tablet, Rfl: 0;  docusate sodium (STOOL SOFTENER) 100 MG capsule, Take 100 mg by mouth., Disp: , Rfl: ;  fentaNYL (DURAGESIC) 12 MCG/HR, Place 1 patch (12.5 mcg total) onto the skin every 3 (three) days., Disp: 10 patch, Rfl: 0 HYDROmorphone (DILAUDID) 2 MG tablet, Take 1 tablet (2 mg total) by mouth every 6 (six) hours as needed for moderate pain or severe pain., Disp: 120 tablet, Rfl: 0;  levothyroxine  (SYNTHROID) 50 MCG tablet, Take by mouth., Disp: , Rfl: ;  lidocaine (LIDODERM) 5 %, Place 1 patch onto the skin daily. Remove & Discard patch within 12 hours or as directed by MD, Disp: , Rfl:  loperamide (IMODIUM) 2 MG capsule, Take 1 capsule (2 mg total) by mouth as needed for diarrhea or loose stools., Disp: 30 capsule, Rfl: 0;  LORazepam (ATIVAN) 2 MG tablet, Take 1 tablet (2 mg total) by mouth every 8 (eight) hours as needed for anxiety., Disp: 90 tablet, Rfl: 1;  omeprazole (PRILOSEC) 20 MG capsule, Take 20 mg by mouth 2 (two) times daily. , Disp: , Rfl: ;  Omeprazole (RA OMEPRAZOLE) 20 MG TBEC, Take 40 mg by mouth., Disp: , Rfl:  ondansetron (ZOFRAN) 8 MG tablet, Take 1 tablet (8 mg total) by mouth 2 (two) times daily. Take 1 tablet by mouth (8 mg ) in the am and one tablet by mouth in the pm beginning day 3 or Wednesday, 05/18/12 for 3 days total., Disp: 20 tablet, Rfl: 3;  oxyCODONE (ROXICODONE) 5 MG immediate release tablet, Take 1 tablet (5 mg total) by mouth every 4 (four) hours as needed for pain., Disp: 40 tablet, Rfl: 0 panobinostat lactate (FARYDAK) 20 MG capsule, Take 1 capsule (20 mg total) by mouth as directed. Swallow whole., Disp: 6 capsule, Rfl: 4;  potassium chloride SA (K-DUR,KLOR-CON) 20 MEQ tablet, Take 1 pill a day, Disp: 30 tablet,  Rfl: 3;  promethazine (PHENERGAN) 25 MG tablet, Take 0.5 tablets (12.5 mg total) by mouth every 6 (six) hours as needed for nausea., Disp: 30 tablet, Rfl: 0 ranitidine (ZANTAC) 150 MG capsule, Take 150 mg by mouth at bedtime., Disp: , Rfl: ;  sitaGLIPtin (JANUVIA) 100 MG tablet, Take 1 tablet (100 mg total) by mouth daily before breakfast., Disp: 30 tablet, Rfl: 0;  traMADol (ULTRAM) 50 MG tablet, TAKE 1 TO 2 TABLETS EVERY 6 TO 8 HOURS AS NEEDED FOR PAIN, Disp: 120 tablet, Rfl: 0 [DISCONTINUED] prochlorperazine (COMPAZINE) 10 MG tablet, Take 1 tablet (10 mg total) by mouth every 6 (six) hours as needed (Nausea or vomiting)., Disp: 30 tablet, Rfl: 1;   insulin glargine (LANTUS) 100 UNIT/ML injection, Inject 5 Units into the skin daily as needed. Just take it when sugar is up per patient, Disp: , Rfl:  No current facility-administered medications for this visit. Facility-Administered Medications Ordered in Other Visits: sodium chloride 0.9 % injection 10 mL, 10 mL, Intravenous, PRN, Volanda Napoleon, MD, 10 mL at 07/16/11 1002  Allergies:  Allergies  Allergen Reactions  . Raspberry     hives    Past Medical History, Surgical history, Social history, and Family History were reviewed and updated.  Review of Systems: As above  Physical Exam:  height is 5\' 11"  (1.803 m) and weight is 207 lb (93.895 kg). His oral temperature is 97.5 F (36.4 C). His blood pressure is 144/71 and his pulse is 90. His respiration is 20 and oxygen saturation is 98%.   Well-developed and well-nourished Afro-American gentleman. Head and neck exam shows no ocular or oral lesions. There are no palpable cervical or supraclavicular lymph nodes. Lungs are clear. Cardiac exam regular in rhythm. She has no murmurs rubs or bruits. Abdomen soft. She has good bowel sounds. There is no fluid wave. There is a palpable liver or spleen tip. Extremities shows no clubbing, cyanosis or edema. Has decent strength in his legs. Has good range of motion of his joints. Exam shows no tenderness over the spine ribs or hips. Neurological exam is nonfocal.  Lab Results  Component Value Date   WBC 3.4* 04/24/2014   HGB 7.3* 04/24/2014   HCT 22.4* 04/24/2014   MCV 96 04/24/2014   PLT 139* 04/24/2014     Chemistry      Component Value Date/Time   NA 143 04/24/2014 1430   NA 140 12/14/2013 1302   K 2.9* 04/24/2014 1430   K 3.6 12/14/2013 1302   CL 108 04/24/2014 1430   CL 109 12/14/2013 1302   CO2 25 04/24/2014 1430   CO2 22 12/14/2013 1302   BUN 17 04/24/2014 1430   BUN 17 12/14/2013 1302   CREATININE 1.8* 04/24/2014 1430   CREATININE 1.22 12/14/2013 1302      Component Value Date/Time   CALCIUM 8.5  04/24/2014 1430   CALCIUM 8.0* 12/14/2013 1302   ALKPHOS 48 04/24/2014 1430   ALKPHOS 53 12/14/2013 1302   AST 17 04/24/2014 1430   AST 12 12/14/2013 1302   ALT 9* 04/24/2014 1430   ALT <8 12/14/2013 1302   BILITOT 0.60 04/24/2014 1430   BILITOT 0.5 12/14/2013 1302         Impression and Plan: Mr. Pallas is 80 year old gentleman. He has refractory light chain myeloma. He has a lambda light chain disease.  Again, we will have to see about getting this monoclonal antibody. I will have to fill out forms. Will try to work on  this drug next week.  He will be transfused. We will get this done at the main [facility].  I'll plan to see him back in about 3 weeks' time.   Volanda Napoleon, MD 9/8/20154:47 PM

## 2014-04-25 ENCOUNTER — Non-Acute Institutional Stay (HOSPITAL_COMMUNITY)
Admission: AD | Admit: 2014-04-25 | Discharge: 2014-04-25 | Disposition: A | Discharge status: Home / Self Care | Payer: Medicare HMO | Source: Ambulatory Visit | Attending: Hematology & Oncology | Admitting: Hematology & Oncology

## 2014-04-25 ENCOUNTER — Telehealth (HOSPITAL_COMMUNITY): Payer: Self-pay | Admitting: Hematology

## 2014-04-25 DIAGNOSIS — C9002 Multiple myeloma in relapse: Secondary | ICD-10-CM

## 2014-04-25 LAB — PREPARE RBC (CROSSMATCH)

## 2014-04-25 MED ORDER — HEPARIN SOD (PORK) LOCK FLUSH 100 UNIT/ML IV SOLN: 250.0000 [IU] | INTRAVENOUS | Status: DC | PRN

## 2014-04-25 MED ORDER — SODIUM CHLORIDE 0.9 % IV SOLN
250.0000 mL | Freq: Once | INTRAVENOUS | Status: AC
  Administered 2014-04-25: 250 mL via INTRAVENOUS

## 2014-04-25 MED ORDER — FUROSEMIDE 10 MG/ML IJ SOLN
20.0000 mg | Freq: Once | INTRAMUSCULAR | Status: AC
  Administered 2014-04-25: 20 mg via INTRAVENOUS
  Filled 2014-04-25: qty 2

## 2014-04-25 MED ORDER — HEPARIN SOD (PORK) LOCK FLUSH 100 UNIT/ML IV SOLN
500.0000 [IU] | Freq: Every day | INTRAVENOUS | Status: AC | PRN
  Administered 2014-04-25: 500 [IU]
  Filled 2014-04-25: qty 5

## 2014-04-25 MED ORDER — SODIUM CHLORIDE 0.9 % IJ SOLN: 3.0000 mL | INTRAMUSCULAR | Status: DC | PRN

## 2014-04-25 MED ORDER — SODIUM CHLORIDE 0.9 % IJ SOLN
10.0000 mL | INTRAMUSCULAR | Status: AC | PRN
  Administered 2014-04-25: 10 mL

## 2014-04-25 NOTE — Procedures (Signed)
[facility]  Procedure Note  Arthur Valdez CEQ:[id number] DOB: [birth year] DOA: 04/25/2014   PCP: Kennon Portela, MD   Associated Diagnosis: Multiple myeloma, in relapse  Procedure Note: Transfusion of 2 units PRBCs   Condition During Procedure:  Pt tolerated well; no complications noted   Condition at Discharge: Pt alert and ambulatory; no complications noted   Nigel Sloop, [facility]

## 2014-04-25 NOTE — Telephone Encounter (Signed)
Left message advising patient that per blood bank, his blood will not be ready until 12:00 today.  Asked patient to return call to [facility].

## 2014-04-26 LAB — TYPE AND SCREEN
ABO/RH(D): O POS
Antibody Screen: NEGATIVE
Unit division: 0
Unit division: 0

## 2014-04-27 ENCOUNTER — Telehealth: Payer: Self-pay | Admitting: *Deleted

## 2014-04-27 LAB — PROTEIN ELECTROPHORESIS, SERUM, WITH REFLEX
ALBUMIN ELP: 58.3 % (ref 55.8–66.1)
Alpha-1-Globulin: 10.1 % — ABNORMAL HIGH (ref 2.9–4.9)
Alpha-2-Globulin: 13.6 % — ABNORMAL HIGH (ref 7.1–11.8)
BETA 2: 4 % (ref 3.2–6.5)
Beta Globulin: 9.8 % — ABNORMAL HIGH (ref 4.7–7.2)
Gamma Globulin: 4.2 % — ABNORMAL LOW (ref 11.1–18.8)
M-Spike, %: 0.21 g/dL
TOTAL PROTEIN, SERUM ELECTROPHOR: 5.1 g/dL — AB (ref 6.0–8.3)

## 2014-04-27 LAB — IGG, IGA, IGM
IgA: 11 mg/dL — ABNORMAL LOW (ref 68–379)
IgG (Immunoglobin G), Serum: 201 mg/dL — ABNORMAL LOW (ref 650–1600)
IgM, Serum: 20 mg/dL — ABNORMAL LOW (ref 41–251)

## 2014-04-27 LAB — KAPPA/LAMBDA LIGHT CHAINS
Kappa free light chain: 0.22 mg/dL — ABNORMAL LOW (ref 0.33–1.94)
Kappa:Lambda Ratio: 0 — ABNORMAL LOW (ref 0.26–1.65)
Lambda Free Lght Chn: 941 mg/dL — ABNORMAL HIGH (ref 0.57–2.63)

## 2014-04-27 LAB — IFE INTERPRETATION

## 2014-04-27 NOTE — Telephone Encounter (Signed)
Wife called stating pt has received cortisone shots before to help relieve pain. Wife didn't know if Dr Marin Olp had to refer pt to get these injections. Informed pt that Dr Marin Olp doesn't need to refer patients for cortisone injections. Wife stated she was going to call the doctor that gave him a cortisone shot before and see if he could receive another shot due to back pain he is currently having.

## 2014-05-08 ENCOUNTER — Other Ambulatory Visit: Payer: Self-pay | Admitting: *Deleted

## 2014-05-08 DIAGNOSIS — C9002 Multiple myeloma in relapse: Secondary | ICD-10-CM

## 2014-05-08 MED ORDER — AMLODIPINE BESYLATE 10 MG PO TABS: 10.0000 mg | ORAL_TABLET | Freq: Every day | ORAL | Status: AC

## 2014-05-10 ENCOUNTER — Other Ambulatory Visit: Payer: Self-pay | Admitting: Hematology & Oncology

## 2014-05-13 ENCOUNTER — Ambulatory Visit (INDEPENDENT_AMBULATORY_CARE_PROVIDER_SITE_OTHER): Payer: Medicare HMO | Admitting: Family Medicine

## 2014-05-13 VITALS — BP 132/70 | HR 100 | Temp 97.9°F | Resp 18 | Ht 71.0 in | Wt 202.0 lb

## 2014-05-13 DIAGNOSIS — G894 Chronic pain syndrome: Secondary | ICD-10-CM

## 2014-05-13 MED ORDER — LIDOCAINE 5 % EX PTCH: 1.0000 | MEDICATED_PATCH | CUTANEOUS | Status: DC

## 2014-05-13 MED ORDER — OXYCODONE HCL ER 10 MG PO T12A: 10.0000 mg | EXTENDED_RELEASE_TABLET | Freq: Two times a day (BID) | ORAL | Status: DC

## 2014-05-13 NOTE — Progress Notes (Signed)
Subjective:    Patient ID: Arthur Valdez, male    DOB: 11-15-1932, 80 y.o.   MRN: [id number] Chief Complaint  Patient presents with  . Hip Pain    off and on for a month on the right side. Hap hip sugery last Oct the 14th.for having bone cancer    HPI  Has pain in his back from his MM for which he takes chronic pain medication.  He had a rod put into his right femur a yrs ago due to thie MM - he was unable to go to his last check-up because he was sick but his orthopedist Dr. Sherlynn Stalls in [place] had discussed placing hip/pelvic cortisone injections but did not do this as pt did not go to his last appointment due to his pain.  He is on chemo and is followed by Dr. Estella Husk for his MM.    Takes prn oxycodone and tramadol.  Hip pain is persisting but hasn't acutely worsened - it is intermittent.  He is concerned about taking to much oxycodone so uses it rarely but tramadol isn't enough.  Had lidoderm which helped some. Had vicodin in the beginning.  Past Medical History  Diagnosis Date  . Multiple myeloma in relapse 07/16/2011  . Anemia of renal disease 07/16/2011  . Diabetes mellitus   . Hypertension   . Arthritis   . Anxiety   . Thyroid disease   . Radiation 03/07/14-03/16/14    lower sternal region 24 gray   Current Outpatient Prescriptions on File Prior to Visit  Medication Sig Dispense Refill  . albuterol (PROVENTIL HFA) 108 (90 BASE) MCG/ACT inhaler Inhale 2 puffs into the lungs every 4 (four) hours as needed.  2 Inhaler  3  . amLODipine (NORVASC) 10 MG tablet Take 1 tablet (10 mg total) by mouth daily.  30 tablet  2  . B Complex-C (B-COMPLEX WITH VITAMIN C) tablet Take 1 tablet by mouth daily.      . clotrimazole (MYCELEX) 10 MG troche Take 1 tablet (10 mg total) by mouth 3 (three) times daily as needed. Mouth sore  90 tablet  0  . docusate sodium (STOOL SOFTENER) 100 MG capsule Take 100 mg by mouth.      Marland Kitchen HYDROmorphone (DILAUDID) 2 MG tablet Take 1 tablet (2 mg total) by  mouth every 6 (six) hours as needed for moderate pain or severe pain.  120 tablet  0  . insulin glargine (LANTUS) 100 UNIT/ML injection Inject 5 Units into the skin daily as needed. Just take it when sugar is up per patient      . JANUVIA 100 MG tablet TAKE ONE TABLET BY MOUTH BEFORE BREAKFAST. NEED OFFICE VISIT.  30 tablet  0  . levothyroxine (SYNTHROID) 50 MCG tablet Take by mouth.      . loperamide (IMODIUM) 2 MG capsule Take 1 capsule (2 mg total) by mouth as needed for diarrhea or loose stools.  30 capsule  0  . LORazepam (ATIVAN) 2 MG tablet Take 1 tablet (2 mg total) by mouth every 8 (eight) hours as needed for anxiety.  90 tablet  1  . omeprazole (PRILOSEC) 20 MG capsule Take 20 mg by mouth 2 (two) times daily.       . Omeprazole (RA OMEPRAZOLE) 20 MG TBEC Take 40 mg by mouth.      . ondansetron (ZOFRAN) 8 MG tablet Take 1 tablet (8 mg total) by mouth 2 (two) times daily. Take 1 tablet by mouth (8  mg ) in the am and one tablet by mouth in the pm beginning day 3 or Wednesday, 05/18/12 for 3 days total.  20 tablet  3  . oxyCODONE (ROXICODONE) 5 MG immediate release tablet Take 1 tablet (5 mg total) by mouth every 4 (four) hours as needed for pain.  40 tablet  0  . potassium chloride SA (K-DUR,KLOR-CON) 20 MEQ tablet Take 1 pill a day  30 tablet  3  . promethazine (PHENERGAN) 25 MG tablet Take 0.5 tablets (12.5 mg total) by mouth every 6 (six) hours as needed for nausea.  30 tablet  0  . ranitidine (ZANTAC) 150 MG capsule Take 150 mg by mouth at bedtime.      . traMADol (ULTRAM) 50 MG tablet TAKE 1 TO 2 TABLETS EVERY 6 TO 8 HOURS AS NEEDED FOR PAIN  120 tablet  0  . [DISCONTINUED] prochlorperazine (COMPAZINE) 10 MG tablet Take 1 tablet (10 mg total) by mouth every 6 (six) hours as needed (Nausea or vomiting).  30 tablet  1   Current Facility-Administered Medications on File Prior to Visit  Medication Dose Route Frequency Provider Last Rate Last Dose  . sodium chloride 0.9 % injection 10 mL  10  mL Intravenous PRN Volanda Napoleon, MD   10 mL at 07/16/11 1002   Allergies  Allergen Reactions  . Raspberry     hives       Review of Systems  Constitutional: Negative for fever, chills, diaphoresis, activity change and appetite change.  Musculoskeletal: Positive for arthralgias, back pain, gait problem and myalgias. Negative for joint swelling.  Skin: Negative for color change, rash and wound.  Neurological: Positive for weakness. Negative for numbness.  Hematological: Negative for adenopathy. Does not bruise/bleed easily.       Objective:  BP 132/70  Pulse 100  Temp(Src) 97.9 F (36.6 C) (Oral)  Resp 18  Ht '5\' 11"'  (1.803 m)  Wt 202 lb (91.627 kg)  BMI 28.19 kg/m2  SpO2 98%  Physical Exam  Constitutional: He is oriented to person, place, and time. He appears well-developed and well-nourished. No distress.  HENT:  Head: Normocephalic and atraumatic.  Eyes: No scleral icterus.  Pulmonary/Chest: Effort normal.  Neurological: He is alert and oriented to person, place, and time.  Skin: Skin is warm and dry. He is not diaphoretic.  Psychiatric: He has a normal mood and affect. His behavior is normal.          Assessment & Plan:   Chronic pain syndrome  Meds ordered this encounter  Medications  . DISCONTD: lidocaine (LIDODERM) 5 %    Sig: Place 1 patch onto the skin daily. Remove & Discard patch within 12 hours or as directed by MD    Dispense:  30 patch    Refill:  5  . DISCONTD: OxyCODONE (OXYCONTIN) 10 mg T12A 12 hr tablet    Sig: Take 1 tablet (10 mg total) by mouth every 12 (twelve) hours.    Dispense:  60 tablet    Refill:  0    Delman Cheadle, MD MPH

## 2014-05-15 ENCOUNTER — Other Ambulatory Visit: Payer: Self-pay | Admitting: *Deleted

## 2014-05-15 DIAGNOSIS — C9002 Multiple myeloma in relapse: Secondary | ICD-10-CM

## 2014-05-16 ENCOUNTER — Ambulatory Visit (HOSPITAL_BASED_OUTPATIENT_CLINIC_OR_DEPARTMENT_OTHER): Payer: Medicare HMO | Admitting: Hematology & Oncology

## 2014-05-16 ENCOUNTER — Encounter: Payer: Self-pay | Admitting: Hematology & Oncology

## 2014-05-16 ENCOUNTER — Ambulatory Visit (HOSPITAL_BASED_OUTPATIENT_CLINIC_OR_DEPARTMENT_OTHER): Payer: Medicare HMO | Admitting: Lab

## 2014-05-16 ENCOUNTER — Telehealth: Payer: Self-pay

## 2014-05-16 ENCOUNTER — Encounter: Payer: Self-pay | Admitting: Lab

## 2014-05-16 VITALS — BP 125/78 | HR 97 | Temp 98.2°F | Resp 20 | Ht 71.0 in | Wt 209.0 lb

## 2014-05-16 DIAGNOSIS — C9000 Multiple myeloma not having achieved remission: Secondary | ICD-10-CM

## 2014-05-16 DIAGNOSIS — C9002 Multiple myeloma in relapse: Secondary | ICD-10-CM

## 2014-05-16 DIAGNOSIS — F064 Anxiety disorder due to known physiological condition: Secondary | ICD-10-CM

## 2014-05-16 LAB — CBC WITH DIFFERENTIAL (CANCER CENTER ONLY)
BASO#: 0 10*3/uL (ref 0.0–0.2)
BASO%: 0 % (ref 0.0–2.0)
EOS%: 1.3 % (ref 0.0–7.0)
Eosinophils Absolute: 0 10*3/uL (ref 0.0–0.5)
HCT: 27 % — ABNORMAL LOW (ref 38.7–49.9)
HEMOGLOBIN: 9.1 g/dL — AB (ref 13.0–17.1)
LYMPH#: 0.6 10*3/uL — ABNORMAL LOW (ref 0.9–3.3)
LYMPH%: 18.2 % (ref 14.0–48.0)
MCH: 31.1 pg (ref 28.0–33.4)
MCHC: 33.7 g/dL (ref 32.0–35.9)
MCV: 92 fL (ref 82–98)
MONO#: 0.4 10*3/uL (ref 0.1–0.9)
MONO%: 11.8 % (ref 0.0–13.0)
NEUT%: 68.7 % (ref 40.0–80.0)
NEUTROS ABS: 2.2 10*3/uL (ref 1.5–6.5)
PLATELETS: 137 10*3/uL — AB (ref 145–400)
RBC: 2.93 10*6/uL — AB (ref 4.20–5.70)
RDW: 16.9 % — ABNORMAL HIGH (ref 11.1–15.7)
WBC: 3.1 10*3/uL — AB (ref 4.0–10.0)

## 2014-05-16 MED ORDER — LORAZEPAM 2 MG PO TABS: 2.0000 mg | ORAL_TABLET | Freq: Three times a day (TID) | ORAL | Status: AC | PRN

## 2014-05-16 MED ORDER — OXYCODONE HCL 5 MG PO TABS: 5.0000 mg | ORAL_TABLET | ORAL | Status: DC | PRN

## 2014-05-16 NOTE — Telephone Encounter (Signed)
PA needed for lidoderm patches. Completed on covermymeds. Pending.

## 2014-05-16 NOTE — Progress Notes (Signed)
Hematology and Oncology Follow Up Visit  Arthur Valdez [id number] Dec 29, 1932 80 y.o. 05/16/2014   Principle Diagnosis:  Refractory lambda light chain myeloma  Current Therapy:    Observation  Supportive care with transfusions as indicated     Interim History:  [name] is back for followup. He actually looks pretty good. He was last transfused about 3 weeks ago.  His have some pain in his hips. He's going to down the [facility].  I went ahead and gave him some samples of Duragesic. I had some thought given to [place] by patient who had passed away. I though this might help with brownout. He has a hard time paying for medications. I went ahead and put him to put on a 25 mcg patch.  He is on oxycodone for short-term pain relief. I went ahead and refilled the prescription.  His appetite has been okay. He's had no nausea vomiting. He's had no diarrhea or constipation. He's had no leg swelling. He's had no fever. He's had no cough.  Overall, his performance status is ECOG 1-2.  I am trying to get him access to be new monoclonal antibody that is available.  Medications: Current outpatient prescriptions:albuterol (PROVENTIL HFA) 108 (90 BASE) MCG/ACT inhaler, Inhale 2 puffs into the lungs every 4 (four) hours as needed., Disp: 2 Inhaler, Rfl: 3;  amLODipine (NORVASC) 10 MG tablet, Take 1 tablet (10 mg total) by mouth daily., Disp: 30 tablet, Rfl: 2;  B Complex-C (B-COMPLEX WITH VITAMIN C) tablet, Take 1 tablet by mouth daily., Disp: , Rfl:  clotrimazole (MYCELEX) 10 MG troche, Take 1 tablet (10 mg total) by mouth 3 (three) times daily as needed. Mouth sore, Disp: 90 tablet, Rfl: 0;  docusate sodium (STOOL SOFTENER) 100 MG capsule, Take 100 mg by mouth., Disp: , Rfl: ;  insulin glargine (LANTUS) 100 UNIT/ML injection, Inject 5 Units into the skin daily as needed. Just take it when sugar is up per patient, Disp: , Rfl:  JANUVIA 100 MG tablet, TAKE ONE TABLET BY MOUTH BEFORE BREAKFAST.  NEED OFFICE VISIT., Disp: 30 tablet, Rfl: 0;  levothyroxine (SYNTHROID) 50 MCG tablet, Take by mouth., Disp: , Rfl: ;  lidocaine (LIDODERM) 5 %, Place 1 patch onto the skin daily. Remove & Discard patch within 12 hours or as directed by MD, Disp: 30 patch, Rfl: 5 loperamide (IMODIUM) 2 MG capsule, Take 1 capsule (2 mg total) by mouth as needed for diarrhea or loose stools., Disp: 30 capsule, Rfl: 0;  LORazepam (ATIVAN) 2 MG tablet, Take 1 tablet (2 mg total) by mouth every 8 (eight) hours as needed for anxiety., Disp: 90 tablet, Rfl: 1;  omeprazole (PRILOSEC) 20 MG capsule, Take 20 mg by mouth 2 (two) times daily. , Disp: , Rfl:  ondansetron (ZOFRAN) 8 MG tablet, Take 1 tablet (8 mg total) by mouth 2 (two) times daily. Take 1 tablet by mouth (8 mg ) in the am and one tablet by mouth in the pm beginning day 3 or Wednesday, 05/18/12 for 3 days total., Disp: 20 tablet, Rfl: 3;  oxyCODONE (ROXICODONE) 5 MG immediate release tablet, Take 1 tablet (5 mg total) by mouth every 4 (four) hours as needed., Disp: 100 tablet, Rfl: 0 potassium chloride SA (K-DUR,KLOR-CON) 20 MEQ tablet, Take 1 pill a day, Disp: 30 tablet, Rfl: 3;  promethazine (PHENERGAN) 25 MG tablet, Take 0.5 tablets (12.5 mg total) by mouth every 6 (six) hours as needed for nausea., Disp: 30 tablet, Rfl: 0;  ranitidine (ZANTAC) 150 MG capsule, Take 150 mg by mouth at bedtime., Disp: , Rfl:  [DISCONTINUED] prochlorperazine (COMPAZINE) 10 MG tablet, Take 1 tablet (10 mg total) by mouth every 6 (six) hours as needed (Nausea or vomiting)., Disp: 30 tablet, Rfl: 1 No current facility-administered medications for this visit. Facility-Administered Medications Ordered in Other Visits: sodium chloride 0.9 % injection 10 mL, 10 mL, Intravenous, PRN, Volanda Napoleon, MD, 10 mL at 07/16/11 1002  Allergies:  Allergies  Allergen Reactions  . Raspberry     hives    Past Medical History, Surgical history, Social history, and Family History were reviewed and  updated.  Review of Systems: As above  Physical Exam:  height is 5\' 11"  (1.803 m) and weight is 209 lb (94.802 kg). His oral temperature is 98.2 F (36.8 C). His blood pressure is 125/78 and his pulse is 97. His respiration is 20.   Well-developed and well-nourished African American gentleman. Head and neck exam shows no ocular or oral lesions. There are no palpable cervical or supraclavicular lymph nodes. Lungs are clear. Cardiac exam regular in rhythm with no murmurs, rubs or bruits. Abdomen is soft. He has good bowel sounds. There is no fluid wave. There is no palpable liver or spleen tip. Back exam shows no tenderness over the spine, ribs or hips. Extremities shows no clubbing, cyanosis or edema. Skin exam no rashes. Neurological exam shows no focal neurological deficits.  Lab Results  Component Value Date   WBC 3.1* 05/16/2014   HGB 9.1* 05/16/2014   HCT 27.0* 05/16/2014   MCV 92 05/16/2014   PLT 137* 05/16/2014     Chemistry      Component Value Date/Time   NA 143 04/24/2014 1430   NA 140 12/14/2013 1302   K 2.9* 04/24/2014 1430   K 3.6 12/14/2013 1302   CL 108 04/24/2014 1430   CL 109 12/14/2013 1302   CO2 25 04/24/2014 1430   CO2 22 12/14/2013 1302   BUN 17 04/24/2014 1430   BUN 17 12/14/2013 1302   CREATININE 1.8* 04/24/2014 1430   CREATININE 1.22 12/14/2013 1302      Component Value Date/Time   CALCIUM 8.5 04/24/2014 1430   CALCIUM 8.0* 12/14/2013 1302   ALKPHOS 48 04/24/2014 1430   ALKPHOS 53 12/14/2013 1302   AST 17 04/24/2014 1430   AST 12 12/14/2013 1302   ALT 9* 04/24/2014 1430   ALT <8 12/14/2013 1302   BILITOT 0.60 04/24/2014 1430   BILITOT 0.5 12/14/2013 1302         Impression and Plan: Arthur Valdez is 80 year old gentleman with refractory light chain myeloma. Has lambda light chain myeloma.  For now, we will continue to work on getting him into the expanded access program.  Hopefully, the [facility] patch will help him.  I am surprised his hemoglobin is doing as well as it is.  I  will plan to get him back in 3 weeks.  I would think that he will need to be transfused at some point in October.   Volanda Napoleon, MD 9/30/20152:48 PM

## 2014-05-17 NOTE — Telephone Encounter (Signed)
[employer] called and I answered add'l ?s. PA has gone to [employer] for review.

## 2014-05-18 ENCOUNTER — Telehealth: Payer: Self-pay | Admitting: Hematology & Oncology

## 2014-05-18 LAB — PROTEIN ELECTROPHORESIS, SERUM, WITH REFLEX
ALBUMIN ELP: 57.7 % (ref 55.8–66.1)
ALPHA-1-GLOBULIN: 10.3 % — AB (ref 2.9–4.9)
Alpha-2-Globulin: 14.2 % — ABNORMAL HIGH (ref 7.1–11.8)
BETA 2: 4 % (ref 3.2–6.5)
Beta Globulin: 9.7 % — ABNORMAL HIGH (ref 4.7–7.2)
Gamma Globulin: 4.1 % — ABNORMAL LOW (ref 11.1–18.8)
M-Spike, %: 0.22 g/dL
Total Protein, Serum Electrophoresis: 5.4 g/dL — ABNORMAL LOW (ref 6.0–8.3)

## 2014-05-18 LAB — COMPREHENSIVE METABOLIC PANEL
ALT: 8 U/L (ref 0–53)
AST: 15 U/L (ref 0–37)
Albumin: 3.4 g/dL — ABNORMAL LOW (ref 3.5–5.2)
Alkaline Phosphatase: 59 U/L (ref 39–117)
BUN: 21 mg/dL (ref 6–23)
CALCIUM: 8.8 mg/dL (ref 8.4–10.5)
CHLORIDE: 109 meq/L (ref 96–112)
CO2: 19 meq/L (ref 19–32)
Creatinine, Ser: 1.77 mg/dL — ABNORMAL HIGH (ref 0.50–1.35)
Glucose, Bld: 107 mg/dL — ABNORMAL HIGH (ref 70–99)
Potassium: 4 mEq/L (ref 3.5–5.3)
SODIUM: 142 meq/L (ref 135–145)
Total Bilirubin: 0.4 mg/dL (ref 0.2–1.2)
Total Protein: 5.4 g/dL — ABNORMAL LOW (ref 6.0–8.3)

## 2014-05-18 LAB — KAPPA/LAMBDA LIGHT CHAINS
Kappa free light chain: 0.27 mg/dL — ABNORMAL LOW (ref 0.33–1.94)
Kappa:Lambda Ratio: 0 — ABNORMAL LOW (ref 0.26–1.65)
Lambda Free Lght Chn: 862 mg/dL — ABNORMAL HIGH (ref 0.57–2.63)

## 2014-05-18 LAB — IFE INTERPRETATION

## 2014-05-18 LAB — IGG, IGA, IGM
IgA: 10 mg/dL — ABNORMAL LOW (ref 68–379)
IgG (Immunoglobin G), Serum: 219 mg/dL — ABNORMAL LOW (ref 650–1600)
IgM, Serum: 19 mg/dL — ABNORMAL LOW (ref 41–251)

## 2014-05-18 NOTE — Telephone Encounter (Signed)
PA was denied d/t Dx not being a "medically-accepted indications for use supported by the FDA.". Dr Brigitte Pulse, do you want to Rx anything else?

## 2014-05-18 NOTE — Telephone Encounter (Signed)
Left message with 10-21 appointment

## 2014-05-20 NOTE — Telephone Encounter (Signed)
He was rx'ed oxycontin at the visit and should f/u w/ his orthopedist at [facility]

## 2014-05-21 ENCOUNTER — Telehealth: Payer: Self-pay | Admitting: Nurse Practitioner

## 2014-05-21 NOTE — Telephone Encounter (Addendum)
Message copied by Jimmy Footman on Mon May 21, 2014  2:31 PM ------      Message from: Burney Gauze R      Created: Fri May 18, 2014  7:50 AM       call - light chains are holding stable!!!  Praise God!! Arthur Valdez ------LVM on pt's personal machine and informed to contact the office.

## 2014-05-21 NOTE — Telephone Encounter (Signed)
LM for rtn call. 

## 2014-05-23 NOTE — Telephone Encounter (Signed)
Pt is aware of the claim denial and has been seeing his ortho dr.

## 2014-06-02 ENCOUNTER — Emergency Department (HOSPITAL_COMMUNITY)
Admission: EM | Admit: 2014-06-02 | Discharge: 2014-06-02 | Disposition: A | Discharge status: Home / Self Care | Payer: Medicare HMO | Attending: Emergency Medicine | Admitting: Emergency Medicine

## 2014-06-02 ENCOUNTER — Emergency Department (HOSPITAL_COMMUNITY): Payer: Medicare HMO

## 2014-06-02 ENCOUNTER — Encounter (HOSPITAL_COMMUNITY): Payer: Self-pay | Admitting: Emergency Medicine

## 2014-06-02 DIAGNOSIS — E079 Disorder of thyroid, unspecified: Secondary | ICD-10-CM | POA: Insufficient documentation

## 2014-06-02 DIAGNOSIS — M1990 Unspecified osteoarthritis, unspecified site: Secondary | ICD-10-CM | POA: Insufficient documentation

## 2014-06-02 DIAGNOSIS — Z87891 Personal history of nicotine dependence: Secondary | ICD-10-CM | POA: Insufficient documentation

## 2014-06-02 DIAGNOSIS — E119 Type 2 diabetes mellitus without complications: Secondary | ICD-10-CM | POA: Insufficient documentation

## 2014-06-02 DIAGNOSIS — I10 Essential (primary) hypertension: Secondary | ICD-10-CM | POA: Diagnosis not present

## 2014-06-02 DIAGNOSIS — M79605 Pain in left leg: Secondary | ICD-10-CM | POA: Insufficient documentation

## 2014-06-02 DIAGNOSIS — M549 Dorsalgia, unspecified: Secondary | ICD-10-CM | POA: Insufficient documentation

## 2014-06-02 DIAGNOSIS — G893 Neoplasm related pain (acute) (chronic): Secondary | ICD-10-CM | POA: Insufficient documentation

## 2014-06-02 DIAGNOSIS — C7951 Secondary malignant neoplasm of bone: Secondary | ICD-10-CM | POA: Diagnosis not present

## 2014-06-02 DIAGNOSIS — F419 Anxiety disorder, unspecified: Secondary | ICD-10-CM | POA: Diagnosis not present

## 2014-06-02 DIAGNOSIS — M79606 Pain in leg, unspecified: Secondary | ICD-10-CM

## 2014-06-02 DIAGNOSIS — C9002 Multiple myeloma in relapse: Secondary | ICD-10-CM | POA: Insufficient documentation

## 2014-06-02 DIAGNOSIS — Z862 Personal history of diseases of the blood and blood-forming organs and certain disorders involving the immune mechanism: Secondary | ICD-10-CM | POA: Insufficient documentation

## 2014-06-02 DIAGNOSIS — Z794 Long term (current) use of insulin: Secondary | ICD-10-CM | POA: Diagnosis not present

## 2014-06-02 DIAGNOSIS — M25552 Pain in left hip: Secondary | ICD-10-CM | POA: Diagnosis present

## 2014-06-02 DIAGNOSIS — C9000 Multiple myeloma not having achieved remission: Secondary | ICD-10-CM

## 2014-06-02 DIAGNOSIS — Z79899 Other long term (current) drug therapy: Secondary | ICD-10-CM | POA: Insufficient documentation

## 2014-06-02 LAB — CBC WITH DIFFERENTIAL/PLATELET
Basophils Absolute: 0 10*3/uL (ref 0.0–0.1)
Basophils Relative: 0 % (ref 0–1)
Eosinophils Absolute: 0.1 10*3/uL (ref 0.0–0.7)
Eosinophils Relative: 1 % (ref 0–5)
HCT: 27.9 % — ABNORMAL LOW (ref 39.0–52.0)
HEMOGLOBIN: 9.1 g/dL — AB (ref 13.0–17.0)
Lymphocytes Relative: 15 % (ref 12–46)
Lymphs Abs: 0.6 10*3/uL — ABNORMAL LOW (ref 0.7–4.0)
MCH: 29.5 pg (ref 26.0–34.0)
MCHC: 32.6 g/dL (ref 30.0–36.0)
MCV: 90.6 fL (ref 78.0–100.0)
MONO ABS: 0.7 10*3/uL (ref 0.1–1.0)
MONOS PCT: 15 % — AB (ref 3–12)
NEUTROS ABS: 3 10*3/uL (ref 1.7–7.7)
Neutrophils Relative %: 69 % (ref 43–77)
Platelets: 143 10*3/uL — ABNORMAL LOW (ref 150–400)
RBC: 3.08 MIL/uL — ABNORMAL LOW (ref 4.22–5.81)
RDW: 17.1 % — ABNORMAL HIGH (ref 11.5–15.5)
WBC: 4.3 10*3/uL (ref 4.0–10.5)

## 2014-06-02 LAB — BASIC METABOLIC PANEL
Anion gap: 18 — ABNORMAL HIGH (ref 5–15)
BUN: 20 mg/dL (ref 6–23)
CALCIUM: 9.9 mg/dL (ref 8.4–10.5)
CHLORIDE: 96 meq/L (ref 96–112)
CO2: 18 mEq/L — ABNORMAL LOW (ref 19–32)
CREATININE: 2.06 mg/dL — AB (ref 0.50–1.35)
GFR calc non Af Amer: 29 mL/min — ABNORMAL LOW (ref >90)
GFR, EST AFRICAN AMERICAN: 34 mL/min — AB (ref >90)
Glucose, Bld: 115 mg/dL — ABNORMAL HIGH (ref 70–99)
Potassium: 4 mEq/L (ref 3.7–5.3)
Sodium: 132 mEq/L — ABNORMAL LOW (ref 137–147)

## 2014-06-02 MED ORDER — OXYCODONE-ACETAMINOPHEN 5-325 MG PO TABS: 1.0000 | ORAL_TABLET | ORAL | Status: DC | PRN

## 2014-06-02 MED ORDER — SODIUM CHLORIDE 0.9 % IV SOLN
INTRAVENOUS | Status: DC
  Administered 2014-06-02: 18:00:00 via INTRAVENOUS

## 2014-06-02 MED ORDER — OXYCODONE-ACETAMINOPHEN 5-325 MG PO TABS
1.0000 | ORAL_TABLET | Freq: Once | ORAL | Status: AC
  Administered 2014-06-02: 1 via ORAL
  Filled 2014-06-02: qty 1

## 2014-06-02 MED ORDER — GADOBENATE DIMEGLUMINE 529 MG/ML IV SOLN
20.0000 mL | Freq: Once | INTRAVENOUS | Status: AC | PRN
Start: 2014-06-02 — End: 2014-06-02
  Administered 2014-06-02: 20 mL via INTRAVENOUS

## 2014-06-02 NOTE — ED Notes (Signed)
Per pt sts left hip pain since Wednesday. sts went to step up on a step and that's when it started. Denies injury.

## 2014-06-02 NOTE — ED Provider Notes (Signed)
CSN: [id number]     Arrival date & time 06/02/14  1512 History   First MD Initiated Contact with Patient 06/02/14 1717     Chief Complaint  Patient presents with  . Hip Pain     (Consider location/radiation/quality/duration/timing/severity/associated sxs/prior Treatment) HPI  Arthur Valdez is a 80 y.o. male who presents for evaluation of left hip pain, which began suddenly, 3 days ago, as he stepped up, onto a stair. Since then, he has been unable to walk, secondary to left upper leg pain. He denies back pain, paresthesias, nausea, or vomiting. He had similar pain, several years ago, when he was treated for a while in the right femur. He is currently on holiday from treatment for multiple myeloma with radiation and chemotherapy. He denies fever, chills, nausea, vomiting, weakness, or dizziness. He's taking his usual medications, without relief. There are no other known modifying factors.   Past Medical History  Diagnosis Date  . Multiple myeloma in relapse 07/16/2011  . Anemia of renal disease 07/16/2011  . Diabetes mellitus   . Hypertension   . Arthritis   . Anxiety   . Thyroid disease   . Radiation 03/07/14-03/16/14    lower sternal region 24 gray   Past Surgical History  Procedure Laterality Date  . Joint replacement    . Prostate surgery    . Percutaneous pinning femoral neck fracture Right 2014  . Insertion prostate radiation seed  1999  . Fracture surgery     Family History  Problem Relation Age of Onset  . Cancer Brother   . Diabetes Brother   . Cancer Father   . Cancer Brother   . Cancer Sister   . Cancer Sister    History  Substance Use Topics  . Smoking status: Former Smoker -- 0.25 packs/day for 50 years    Types: Cigarettes    Start date: 10/07/1943    Quit date: 02/27/1994  . Smokeless tobacco: Never Used     Comment: quit smoking 20 years ago  . Alcohol Use: No    Review of Systems  All other systems reviewed and are negative.     Allergies   Raspberry  Home Medications   Prior to Admission medications   Medication Sig Start Date End Date Taking? Authorizing Provider  albuterol (PROVENTIL HFA) 108 (90 BASE) MCG/ACT inhaler Inhale 2 puffs into the lungs every 4 (four) hours as needed. 07/26/12   Posey Boyer, MD  amLODipine (NORVASC) 10 MG tablet Take 1 tablet (10 mg total) by mouth daily. 05/08/14   Volanda Napoleon, MD  B Complex-C (B-COMPLEX WITH VITAMIN C) tablet Take 1 tablet by mouth daily.    Historical Provider, MD  clotrimazole (MYCELEX) 10 MG troche Take 1 tablet (10 mg total) by mouth 3 (three) times daily as needed. Mouth sore 04/17/14   Volanda Napoleon, MD  docusate sodium (STOOL SOFTENER) 100 MG capsule Take 100 mg by mouth. 09/08/13   Historical Provider, MD  insulin glargine (LANTUS) 100 UNIT/ML injection Inject 5 Units into the skin daily as needed. Just take it when sugar is up per patient    Historical Provider, MD  JANUVIA 100 MG tablet TAKE ONE TABLET BY MOUTH BEFORE BREAKFAST. NEED OFFICE VISIT. 05/10/14   Volanda Napoleon, MD  levothyroxine (SYNTHROID) 50 MCG tablet Take by mouth. 01/22/14   Historical Provider, MD  lidocaine (LIDODERM) 5 % Place 1 patch onto the skin daily. Remove & Discard patch within 12 hours or as  directed by MD 05/13/14   Shawnee Knapp, MD  loperamide (IMODIUM) 2 MG capsule Take 1 capsule (2 mg total) by mouth as needed for diarrhea or loose stools. 07/27/13   Bernadene Bell, MD  LORazepam (ATIVAN) 2 MG tablet Take 1 tablet (2 mg total) by mouth every 8 (eight) hours as needed for anxiety. 05/16/14   Volanda Napoleon, MD  omeprazole (PRILOSEC) 20 MG capsule Take 20 mg by mouth 2 (two) times daily.     Historical Provider, MD  ondansetron (ZOFRAN) 8 MG tablet Take 1 tablet (8 mg total) by mouth 2 (two) times daily. Take 1 tablet by mouth (8 mg ) in the am and one tablet by mouth in the pm beginning day 3 or Wednesday, 05/18/12 for 3 days total. 05/16/12   Volanda Napoleon, MD  oxyCODONE (ROXICODONE) 5 MG  immediate release tablet Take 1 tablet (5 mg total) by mouth every 4 (four) hours as needed. 05/16/14   Volanda Napoleon, MD  potassium chloride SA (K-DUR,KLOR-CON) 20 MEQ tablet Take 1 pill a day 04/05/14   Volanda Napoleon, MD  promethazine (PHENERGAN) 25 MG tablet Take 0.5 tablets (12.5 mg total) by mouth every 6 (six) hours as needed for nausea. 05/16/12   Volanda Napoleon, MD  ranitidine (ZANTAC) 150 MG capsule Take 150 mg by mouth at bedtime.    Historical Provider, MD   BP 116/75  Pulse 104  Temp(Src) 98.3 F (36.8 C) (Oral)  Resp 18  SpO2 100% Physical Exam  Nursing note and vitals reviewed. Constitutional: He is oriented to person, place, and time. He appears well-developed and well-nourished.  HENT:  Head: Normocephalic and atraumatic.  Right Ear: External ear normal.  Left Ear: External ear normal.  Eyes: Conjunctivae and EOM are normal. Pupils are equal, round, and reactive to light.  Neck: Normal range of motion and phonation normal. Neck supple.  Cardiovascular: Normal rate, regular rhythm and normal heart sounds.   Pulmonary/Chest: Effort normal and breath sounds normal. He exhibits no bony tenderness.  Abdominal: Soft. There is no tenderness.  Musculoskeletal: Normal range of motion.  No pain with passive or active range of motion of the left hip. No deformity of the left hip.  Neurological: He is alert and oriented to person, place, and time. No cranial nerve deficit or sensory deficit. He exhibits normal muscle tone. Coordination normal.  Skin: Skin is warm, dry and intact.  Psychiatric: He has a normal mood and affect. His behavior is normal. Judgment and thought content normal.    ED Course  Procedures (including critical care time) Medications  0.9 %  sodium chloride infusion ( Intravenous New Bag/Given 06/02/14 1747)  oxyCODONE-acetaminophen (PERCOCET/ROXICET) 5-325 MG per tablet 1 tablet (1 tablet Oral Given 06/02/14 1528)    Patient Vitals for the past 24 hrs:   BP Temp Temp src Pulse Resp SpO2  06/02/14 1745 138/74 mmHg - - 81 - 98 %  06/02/14 1730 117/78 mmHg - - 75 - 96 %  06/02/14 1515 116/75 mmHg 98.3 F (36.8 C) Oral 104 18 100 %    8:06 PM Reevaluation with update and discussion. After initial assessment and treatment, an updated evaluation reveals is comfortable. He has a walker at home to help him ambulate. Findings discussed with patient, and wife, all questions answered. [place] Review Labs Reviewed  CBC WITH DIFFERENTIAL  BASIC METABOLIC PANEL    Imaging Review Dg Hip Complete Left  06/02/2014  CLINICAL DATA:  Initial evaluation from pain from lower central lumbar spine to lateral side of left hip for 3 days which began while walking upstairs at his house, current history of myeloma  EXAM: LEFT HIP - COMPLETE 2+ VIEW  COMPARISON:  10/06/1948  FINDINGS: Intra medullary nail right femur with myositis ossificans in the inferior gluteal musculature, stable. Numerous radiation therapy seeds over the prostate. Mild left femoral artery atherosclerotic calcification. Degenerative change lower lumbar spine and bilateral sacroiliac joints. Numerous subcentimeter well-defined lytic lesions throughout the visualized shaft of the femur consistent with myeloma. No acute fracture or dislocation in the region of the left hip joint.  IMPRESSION: No acute abnormality involving left hip. Evidence of myelomatous involvement involving femoral shaft on the left. Degenerative changes in the lumbar spine and sacroiliac joints.   Electronically Signed   By: Skipper Cliche M.D.   On: 06/02/2014 17:21     EKG Interpretation None      MDM   Final diagnoses:  Leg pain  Metastatic multiple myeloma to bone    Back pain secondary to metastatic multiple myeloma. No evidence for fracture. Doubt serious bacterial infection or metabolic instability  Nursing Notes Reviewed/ Care Coordinated Applicable Imaging Reviewed Interpretation of  Laboratory Data incorporated into ED treatment  The patient appears reasonably screened and/or stabilized for discharge and I doubt any other medical condition or other [facility] requiring further screening, evaluation, or treatment in the ED at this time prior to discharge.  Plan: Home Medications- Percocet; Home Treatments- rest, use walker; return here if the recommended treatment, does not improve the symptoms; Recommended follow up- Oncology as scheduled     Richarda Blade, MD 06/02/14 2008

## 2014-06-02 NOTE — ED Notes (Signed)
Patient transported to MRI 

## 2014-06-02 NOTE — Discharge Instructions (Signed)
Use a walker or cane to help you ambulate. Try to eat and drink regularly.   Bone Metastases Cancerous growths can begin in any part of the body. The original site of cancer is called the primary tumor or primary cancer (for example, breast cancer). After cancer has developed in one area of the body, cancerous cells from that area can break away and travel through the body's bloodstream. If these cancerous cells begin growing in another place in the body, they are called metastases. Bone metastases are cancer cells that have spread to the bone (which is different from a cancer that starts in the bone). These secondary growths are like the original tumor. For example, if a prostate cancer spreads to bone it is called metastatic prostate cancer, or prostate cancer metastatic to bone, but not bone cancer. Cancers can spread to almost any bone; the spine and pelvis are often involved.  Any type of cancer can spread to the bone, but the most common are breast, lung, kidney, thyroid and prostate cancers. Sometimes the primary tumor is not discovered until there are bone problems. If the primary cancer location cannot be discovered, the cancer is called cancer of unknown primary location. SYMPTOMS  Pain in the bones is the main symptom of bone metastases. Some other problems may occur first including:  Decreased appetite.  Nausea.  Muscle weakness.  Confusion.  Unusual sleep patterns due to discomfort.  Overly tired (fatigue).  Restlessness. Frail or brittle bones may lead to broken bones (fractures) that lead to learning what is wrong (diagnosis). A tumor often weakens the bones.  DIAGNOSIS  Metastatic cancers may be found months or years after or at the same time as the primary tumor. When a second tumor is found in a patient who has been treated for cancer, it is more often a metastasis than another primary tumor.  The patient's symptoms, physical examination, X-rays and blood tests may  suggest a bone metastases. In addition, an examination of tissue or a cell sample (biopsy) is usually done to find the cancer. This sample is removed with a needle. This tissue sample must be looked at under a microscope to confirm a diagnosis. TREATMENT  Options generally include treatments that give relief from symptoms (palliative) or curative. Those with advanced, metastasized cancer may receive treatment focused on pain relief and prolonging life. These treatments depend on the type of cancer and its location.  Treatment for cancer depends on its type and location. Some of these treatments are:  Surgery to remove the original tumor and/or to remove parts of the body that produce hormones and other chemicals that make cancer worse.  Treatment with drugs (chemotherapy).  Bone marrow transplantations on rare occasions.  Radiation therapy (radiotherapy).  Hormonal therapy.  Pain relieving medications. Your caregiver will help you understand the likelihood that any particular treatment will be helpful for you. While some treatments aim to cure or control the cancer, others give relief from symptoms only. If you have bone metastases, radiation therapy may be recommended to treat pain (if it is in one main location). Pain medications are available. These include strong medicines like morphine. You may be instructed to take a long-acting pain medication (to control most of your pain) and a short-acting medication to control occasional flares of pain. Pain medication is sometimes also given continuously through a pump. HOME CARE INSTRUCTIONS   Take medications exactly as prescribed.  Keep any follow-up appointments.  Pain medications can make you sleepy or confused. Do  not drive, climb ladders, or do other dangerous activities while on pain medication.  Pain medications often cause constipation. Ask your caregiver for information on stool softeners.  Do not share your pain medication with  others. SEEK MEDICAL CARE IF:   Your bone pain is not controlled.  You are having problems or side effects from your medication.  You have excessive sleepiness or confusion. SEEK IMMEDIATE MEDICAL CARE IF:   You fall and have any injury or pain from the fall.  You have trouble walking.  You have numbness or tingling in your legs.  You develop a sudden significant worsening of your pain. Document Released: 07/24/2002 Document Revised: 10/26/2011 Document Reviewed: 03/16/2008 [facility] Patient Information 2015 [place], [place]. This information is not intended to replace advice given to you by your health care provider. Make sure you discuss any questions you have with your health care provider.

## 2014-06-06 ENCOUNTER — Other Ambulatory Visit: Payer: Self-pay | Admitting: *Deleted

## 2014-06-06 ENCOUNTER — Ambulatory Visit (HOSPITAL_BASED_OUTPATIENT_CLINIC_OR_DEPARTMENT_OTHER): Payer: Medicare HMO

## 2014-06-06 ENCOUNTER — Encounter: Payer: Self-pay | Admitting: Family

## 2014-06-06 ENCOUNTER — Other Ambulatory Visit (HOSPITAL_BASED_OUTPATIENT_CLINIC_OR_DEPARTMENT_OTHER): Payer: Medicare HMO | Admitting: Lab

## 2014-06-06 ENCOUNTER — Ambulatory Visit (HOSPITAL_BASED_OUTPATIENT_CLINIC_OR_DEPARTMENT_OTHER): Payer: Medicare HMO | Admitting: Family

## 2014-06-06 VITALS — BP 116/60 | HR 62 | Temp 97.5°F | Resp 98 | Ht 73.0 in | Wt 194.0 lb

## 2014-06-06 DIAGNOSIS — C9002 Multiple myeloma in relapse: Secondary | ICD-10-CM

## 2014-06-06 DIAGNOSIS — C9000 Multiple myeloma not having achieved remission: Secondary | ICD-10-CM

## 2014-06-06 DIAGNOSIS — F064 Anxiety disorder due to known physiological condition: Secondary | ICD-10-CM

## 2014-06-06 DIAGNOSIS — E860 Dehydration: Secondary | ICD-10-CM

## 2014-06-06 LAB — CMP (CANCER CENTER ONLY)
ALT(SGPT): 15 U/L (ref 10–47)
AST: 17 U/L (ref 11–38)
Albumin: 3.4 g/dL (ref 3.3–5.5)
Alkaline Phosphatase: 59 U/L (ref 26–84)
BUN, Bld: 21 mg/dL (ref 7–22)
CO2: 24 meq/L (ref 18–33)
Calcium: 10.8 mg/dL — ABNORMAL HIGH (ref 8.0–10.3)
Chloride: 99 meq/L (ref 98–108)
Creat: 1.7 mg/dL — ABNORMAL HIGH (ref 0.6–1.2)
Glucose, Bld: 152 mg/dL — ABNORMAL HIGH (ref 73–118)
Potassium: 3.7 meq/L (ref 3.3–4.7)
Sodium: 140 meq/L (ref 128–145)
Total Bilirubin: 0.6 mg/dL (ref 0.20–1.60)
Total Protein: 6.9 g/dL (ref 6.4–8.1)

## 2014-06-06 LAB — CBC WITH DIFFERENTIAL (CANCER CENTER ONLY)
BASO#: 0 10*3/uL (ref 0.0–0.2)
BASO%: 0.3 % (ref 0.0–2.0)
EOS ABS: 0.1 10*3/uL (ref 0.0–0.5)
EOS%: 1.5 % (ref 0.0–7.0)
HCT: 28.4 % — ABNORMAL LOW (ref 38.7–49.9)
HGB: 9.4 g/dL — ABNORMAL LOW (ref 13.0–17.1)
LYMPH#: 0.6 10*3/uL — ABNORMAL LOW (ref 0.9–3.3)
LYMPH%: 14.3 % (ref 14.0–48.0)
MCH: 30.6 pg (ref 28.0–33.4)
MCHC: 33.1 g/dL (ref 32.0–35.9)
MCV: 93 fL (ref 82–98)
MONO#: 0.5 10*3/uL (ref 0.1–0.9)
MONO%: 11.5 % (ref 0.0–13.0)
NEUT%: 72.4 % (ref 40.0–80.0)
NEUTROS ABS: 2.8 10*3/uL (ref 1.5–6.5)
PLATELETS: 176 10*3/uL (ref 145–400)
RBC: 3.07 10*6/uL — AB (ref 4.20–5.70)
RDW: 16.2 % — AB (ref 11.1–15.7)
WBC: 3.9 10*3/uL — ABNORMAL LOW (ref 4.0–10.0)

## 2014-06-06 MED ORDER — FENTANYL 25 MCG/HR TD PT72: 25.0000 ug | MEDICATED_PATCH | TRANSDERMAL | Status: DC

## 2014-06-06 MED ORDER — HYDROMORPHONE HCL 4 MG/ML IJ SOLN
INTRAMUSCULAR | Status: AC
  Filled 2014-06-06: qty 1

## 2014-06-06 MED ORDER — ALBUTEROL SULFATE HFA 108 (90 BASE) MCG/ACT IN AERS: 2.0000 | INHALATION_SPRAY | Freq: Four times a day (QID) | RESPIRATORY_TRACT | Status: AC | PRN

## 2014-06-06 MED ORDER — OXYCODONE-ACETAMINOPHEN 5-325 MG PO TABS: 1.0000 | ORAL_TABLET | ORAL | Status: AC | PRN

## 2014-06-06 MED ORDER — SODIUM CHLORIDE 0.9 % IJ SOLN
10.0000 mL | INTRAMUSCULAR | Status: DC | PRN
  Administered 2014-06-06: 10 mL via INTRAVENOUS
  Filled 2014-06-06: qty 10

## 2014-06-06 MED ORDER — HYDROMORPHONE HCL 4 MG/ML IJ SOLN
2.0000 mg | Freq: Once | INTRAMUSCULAR | Status: AC
Start: 2014-06-06 — End: 2014-06-06
  Administered 2014-06-06: 2 mg via INTRAVENOUS

## 2014-06-06 MED ORDER — SODIUM CHLORIDE 0.9 % IV SOLN
Freq: Once | INTRAVENOUS | Status: AC
  Administered 2014-06-06: 12:00:00 via INTRAVENOUS

## 2014-06-06 MED ORDER — HEPARIN SOD (PORK) LOCK FLUSH 100 UNIT/ML IV SOLN
500.0000 [IU] | Freq: Once | INTRAVENOUS | Status: AC
  Administered 2014-06-06: 500 [IU] via INTRAVENOUS
  Filled 2014-06-06: qty 5

## 2014-06-06 NOTE — Patient Instructions (Signed)
Dehydration, Adult Dehydration is when you lose more fluids from the body than you take in. Vital organs like the kidneys, brain, and heart cannot function without a proper amount of fluids and salt. Any loss of fluids from the body can cause dehydration.  CAUSES   Vomiting.  Diarrhea.  Excessive sweating.  Excessive urine output.  Fever. SYMPTOMS  Mild dehydration  Thirst.  Dry lips.  Slightly dry mouth. Moderate dehydration  Very dry mouth.  Sunken eyes.  Skin does not bounce back quickly when lightly pinched and released.  Dark urine and decreased urine production.  Decreased tear production.  Headache. Severe dehydration  Very dry mouth.  Extreme thirst.  Rapid, weak pulse (more than 100 beats per minute at rest).  Cold hands and feet.  Not able to sweat in spite of heat and temperature.  Rapid breathing.  Blue lips.  Confusion and lethargy.  Difficulty being awakened.  Minimal urine production.  No tears. DIAGNOSIS  Your caregiver will diagnose dehydration based on your symptoms and your exam. Blood and urine tests will help confirm the diagnosis. The diagnostic evaluation should also identify the cause of dehydration. TREATMENT  Treatment of mild or moderate dehydration can often be done at home by increasing the amount of fluids that you drink. It is best to drink small amounts of fluid more often. Drinking too much at one time can make vomiting worse. Refer to the home care instructions below. Severe dehydration needs to be treated at the hospital where you will probably be given intravenous (IV) fluids that contain water and electrolytes. HOME CARE INSTRUCTIONS   Ask your caregiver about specific rehydration instructions.  Drink enough fluids to keep your urine clear or pale yellow.  Drink small amounts frequently if you have nausea and vomiting.  Eat as you normally do.  Avoid:  Foods or drinks high in sugar.  Carbonated  drinks.  Juice.  Extremely hot or cold fluids.  Drinks with caffeine.  Fatty, greasy foods.  Alcohol.  Tobacco.  Overeating.  Gelatin desserts.  Wash your hands well to avoid spreading bacteria and viruses.  Only take over-the-counter or prescription medicines for pain, discomfort, or fever as directed by your caregiver.  Ask your caregiver if you should continue all prescribed and over-the-counter medicines.  Keep all follow-up appointments with your caregiver. SEEK MEDICAL CARE IF:  You have abdominal pain and it increases or stays in one area (localizes).  You have a rash, stiff neck, or severe headache.  You are irritable, sleepy, or difficult to awaken.  You are weak, dizzy, or extremely thirsty. SEEK IMMEDIATE MEDICAL CARE IF:   You are unable to keep fluids down or you get worse despite treatment.  You have frequent episodes of vomiting or diarrhea.  You have blood or green matter (bile) in your vomit.  You have blood in your stool or your stool looks black and tarry.  You have not urinated in 6 to 8 hours, or you have only urinated a small amount of very dark urine.  You have a fever.  You faint. MAKE SURE YOU:   Understand these instructions.  Will watch your condition.  Will get help right away if you are not doing well or get worse. Document Released: 08/03/2005 Document Revised: 10/26/2011 Document Reviewed: 03/23/2011 [employer] Patient Information 2015 [employer]. This information is not intended to replace advice given to you by your health care provider. Make sure you discuss any questions you have with your health care   provider.  

## 2014-06-06 NOTE — Progress Notes (Signed)
[facility]  Telephone:([phone]) [phone] Fax:(336) [phone]  ID: Arthur Valdez OB: [birth year] MR#: [id number]#:[id number] Patient Care Team: Orma Flaming, MD as PCP - General (Internal Medicine)  DIAGNOSIS: Refractory lambda light chain myeloma  INTERVAL HISTORY: Arthur Valdez is here today for a follow-up. His is in a great deal of pain in his right side and left hip and is having trouble walking. He went to the ED over the weekend for this. His most recent scans show new lesions on his femur. He has no appetite and has not been drinking much fluid. He has lost 15 lbs since his last visit. He denies fever, chills, n/v, cough, rash, headache, dizziness, chest pain, palpitations, abdominal pain, constipation, diarrhea, blood in urine or stool. He has SOB with exertion. He has had no swelling, tenderness, numbness or tingling in his extremities.   CURRENT TREATMENT: Observation  Supportive care with transfusions as indicated  REVIEW OF SYSTEMS: All other 10 point review of systems is negative except for those issues mentioned above.   PAST MEDICAL HISTORY: Past Medical History  Diagnosis Date  . Multiple myeloma in relapse 07/16/2011  . Anemia of renal disease 07/16/2011  . Diabetes mellitus   . Hypertension   . Arthritis   . Anxiety   . Thyroid disease   . Radiation 03/07/14-03/16/14    lower sternal region 24 gray   PAST SURGICAL HISTORY: Past Surgical History  Procedure Laterality Date  . Joint replacement    . Prostate surgery    . Percutaneous pinning femoral neck fracture Right 2014  . Insertion prostate radiation seed  1999  . Fracture surgery      FAMILY HISTORY Family History  Problem Relation Age of Onset  . Cancer Brother   . Diabetes Brother   . Cancer Father   . Cancer Brother   . Cancer Sister   . Cancer Sister     GYNECOLOGIC HISTORY:  No LMP for male patient.   SOCIAL HISTORY: History   Social History  . Marital Status: Married    Spouse  Name: N/A    Number of Children: 4  . Years of Education: N/A   Occupational History  . retired Administrator    Social History Main Topics  . Smoking status: Former Smoker -- 0.25 packs/day for 50 years    Types: Cigarettes    Start date: 10/07/1943    Quit date: 02/27/1994  . Smokeless tobacco: Never Used     Comment: quit smoking 20 years ago  . Alcohol Use: No  . Drug Use: No  . Sexual Activity: Yes    Birth Control/ Protection: None   Other Topics Concern  . Not on file   Social History Narrative  . No narrative on file    ADVANCED DIRECTIVES:  <no information>  HEALTH MAINTENANCE: History  Substance Use Topics  . Smoking status: Former Smoker -- 0.25 packs/day for 50 years    Types: Cigarettes    Start date: 10/07/1943    Quit date: 02/27/1994  . Smokeless tobacco: Never Used     Comment: quit smoking 20 years ago  . Alcohol Use: No   Colonoscopy: PAP: Bone density: Lipid panel:  Allergies  Allergen Reactions  . Raspberry Hives, Shortness Of Breath and Swelling    Takes benadryl for reaction    Current Outpatient Prescriptions  Medication Sig Dispense Refill  . albuterol (PROVENTIL HFA;VENTOLIN HFA) 108 (90 BASE) MCG/ACT inhaler Inhale 2 puffs into the lungs  every 6 (six) hours as needed for wheezing or shortness of breath.  1 Inhaler  4  . amLODipine (NORVASC) 10 MG tablet Take 1 tablet (10 mg total) by mouth daily.  30 tablet  2  . B Complex-C (B-COMPLEX WITH VITAMIN C) tablet Take 1 tablet by mouth daily.      Marland Kitchen docusate sodium (COLACE) 100 MG capsule Take 100 mg by mouth daily as needed for mild constipation.      . insulin glargine (LANTUS) 100 UNIT/ML injection Inject 5 Units into the skin daily as needed (only as needed for high BG). Just take it when sugar is up per patient      . levothyroxine (SYNTHROID) 50 MCG tablet Take 50 mcg by mouth daily before breakfast.       . LORazepam (ATIVAN) 2 MG tablet Take 1 tablet (2 mg total) by mouth every 8  (eight) hours as needed for anxiety.  90 tablet  1  . omeprazole (PRILOSEC) 20 MG capsule Take 20 mg by mouth 2 (two) times daily.       Marland Kitchen oxycodone (OXY-IR) 5 MG capsule Take 5 mg by mouth every 4 (four) hours as needed for pain.      . potassium chloride SA (K-DUR,KLOR-CON) 20 MEQ tablet Take 20 mEq by mouth daily.      . sitaGLIPtin (JANUVIA) 100 MG tablet Take 100 mg by mouth daily.      . fentaNYL (DURAGESIC - DOSED MCG/HR) 25 MCG/HR patch Place 1 patch (25 mcg total) onto the skin every 3 (three) days.  10 patch  0  . oxyCODONE-acetaminophen (PERCOCET/ROXICET) 5-325 MG per tablet Take 1 tablet by mouth every 4 (four) hours as needed for severe pain.  180 tablet  0  . [DISCONTINUED] prochlorperazine (COMPAZINE) 10 MG tablet Take 1 tablet (10 mg total) by mouth every 6 (six) hours as needed (Nausea or vomiting).  30 tablet  1   No current facility-administered medications for this visit.   Facility-Administered Medications Ordered in Other Visits  Medication Dose Route Frequency Provider Last Rate Last Dose  . sodium chloride 0.9 % injection 10 mL  10 mL Intravenous PRN Volanda Napoleon, MD   10 mL at 07/16/11 1002  . sodium chloride 0.9 % injection 10 mL  10 mL Intravenous PRN Volanda Napoleon, MD   10 mL at 06/06/14 1338   OBJECTIVE: Filed Vitals:   06/06/14 1054  BP: 116/60  Pulse: 62  Temp: 97.5 F (36.4 C)  Resp: 98    Filed Weights   06/06/14 1054  Weight: 194 lb (87.998 kg)   ECOG FS:2 - Symptomatic, <50% confined to bed Ocular: Sclerae unicteric, pupils equal, round and reactive to light Ear-nose-throat: Oropharynx clear, dentition fair Lymphatic: No cervical or supraclavicular adenopathy Lungs no rales or rhonchi, good excursion bilaterally Heart regular rate and rhythm, no murmur appreciated Abd soft, nontender, positive bowel sounds MSK no focal spinal tenderness, no joint edema Neuro: non-focal, well-oriented, appropriate affect  LAB RESULTS: CMP     Component  Value Date/Time   NA 140 06/06/2014 1017   NA 132* 06/02/2014 1745   K 3.7 06/06/2014 1017   K 4.0 06/02/2014 1745   CL 99 06/06/2014 1017   CL 96 06/02/2014 1745   CO2 24 06/06/2014 1017   CO2 18* 06/02/2014 1745   GLUCOSE 152* 06/06/2014 1017   GLUCOSE 115* 06/02/2014 1745   BUN 21 06/06/2014 1017   BUN 20 06/02/2014 1745   CREATININE  1.7* 06/06/2014 1017   CREATININE 2.06* 06/02/2014 1745   CALCIUM 10.8* 06/06/2014 1017   CALCIUM 9.9 06/02/2014 1745   PROT 6.9 06/06/2014 1017   PROT 5.4* 05/16/2014 1327   ALBUMIN 3.4* 05/16/2014 1327   AST 17 06/06/2014 1017   AST 15 05/16/2014 1327   ALT 15 06/06/2014 1017   ALT 8 05/16/2014 1327   ALKPHOS 59 06/06/2014 1017   ALKPHOS 59 05/16/2014 1327   BILITOT 0.60 06/06/2014 1017   BILITOT 0.4 05/16/2014 1327   GFRNONAA 29* 06/02/2014 1745   GFRAA 34* 06/02/2014 1745   No results found for this basename: SPEP, UPEP,  kappa and lambda light chains   Lab Results  Component Value Date   WBC 3.9* 06/06/2014   NEUTROABS 2.8 06/06/2014   HGB 9.4* 06/06/2014   HCT 28.4* 06/06/2014   MCV 93 06/06/2014   PLT 176 06/06/2014   No results found for this basename: LABCA2   No components found with this basename: [id number]   No results found for this basename: INR,  in the last 168 hours  STUDIES: Dg Hip Complete Left  06/02/2014   CLINICAL DATA:  Initial evaluation from pain from lower central lumbar spine to lateral side of left hip for 3 days which began while walking upstairs at his house, current history of myeloma  EXAM: LEFT HIP - COMPLETE 2+ VIEW  COMPARISON:  10/06/1948  FINDINGS: Intra medullary nail right femur with myositis ossificans in the inferior gluteal musculature, stable. Numerous radiation therapy seeds over the prostate. Mild left femoral artery atherosclerotic calcification. Degenerative change lower lumbar spine and bilateral sacroiliac joints. Numerous subcentimeter well-defined lytic lesions throughout the visualized  shaft of the femur consistent with myeloma. No acute fracture or dislocation in the region of the left hip joint.  IMPRESSION: No acute abnormality involving left hip. Evidence of myelomatous involvement involving femoral shaft on the left. Degenerative changes in the lumbar spine and sacroiliac joints.   Electronically Signed   By: Skipper Cliche M.D.   On: 06/02/2014 17:21   Mr Femur Left W Wo Contrast  06/02/2014   CLINICAL DATA:  Patient with a history of multiple myeloma and left upper leg and hip pain. Difficulty walking.  EXAM: MR OF THE LEFT LOWER EXTREMITY WITHOUT AND WITH CONTRAST  TECHNIQUE: Multiplanar, multisequence MR imaging of the lower left extremity was performed both before and after administration of intravenous contrast.  CONTRAST:  20 mL MULTIHANCE GADOBENATE DIMEGLUMINE 529 MG/ML IV SOLN  COMPARISON:  Plain films of the left hip 06/02/2014.  FINDINGS: The images incidentally include the right femur in the coronal plane. No fracture of the left hip or femur is identified. The patient has multiple T2 hyperintense and enhancing lesions in the femur and visualized pelvis consistent with history of myeloma. The largest deposit is in the proximal left femur just below the lesser trochanter. This deposit measures 3.0 cm craniocaudal by 1.4 cm transverse by 2.2 cm AP and fills the medullary space. It abuts the posterior cortex of the femur without disruption identified.  Unicondylar medial compartment arthroplasty of the distal right femur is incidentally noted. No soft tissue mass is identified. No fluid collection is seen. Muscles and tendons appear intact. Incidentally visualized left femur demonstrates artifact from an intra medullary nail and hip screw.  IMPRESSION: Negative for fracture.  Multiple myelomatous deposits throughout the left femur with of largest lesion in the subtrochanteric femur. This lesion abuts the posterior cortex which appears thinned without obstruction.  Electronically Signed   By: Inge Rise M.D.   On: 06/02/2014 19:48   ASSESSMENT/PLAN: Arthur Valdez is 80 year old gentleman with refractory light chain myeloma and lambda light chain myeloma. He has new lesions on his left femur and is in a lot of pain. He says that with all the pain he has no appetite.  He had issues with the cost of the Fentanyl patch at [facility]. It was going to be $98. After speaking with the pharmacy downstairs he can get it for $5. He will get that filled today.  I also refilled his prescription for oxycodone.  We will give him fluids today and also a dose of Dilaudid for pain while he is here.  He is going to try drinking Glucerna with his meals.  His Hgb today is 9.4. We will wait and see what the rest of his labs show.  We will get him back in next week to see Dr. Marin Olp to discuss his MRI and develop a plan.  Hopefully at that time his pain will be betted.  He knows to call here with any questions or concerns and to go to the ED in the event of an emergency.  Eliezer Bottom, NP 06/06/2014 3:05 PM

## 2014-06-07 ENCOUNTER — Telehealth: Payer: Self-pay | Admitting: *Deleted

## 2014-06-07 LAB — KAPPA/LAMBDA LIGHT CHAINS
KAPPA FREE LGHT CHN: 0.88 mg/dL (ref 0.33–1.94)
Kappa:Lambda Ratio: 0 — ABNORMAL LOW (ref 0.26–1.65)
Lambda Free Lght Chn: 917 mg/dL — ABNORMAL HIGH (ref 0.57–2.63)

## 2014-06-07 LAB — TESTOSTERONE: Testosterone: 91 ng/dL — ABNORMAL LOW (ref 300–890)

## 2014-06-07 NOTE — Telephone Encounter (Addendum)
Spoke with patient and notified him of results. Transferred patient to [facility] to schedule appointment for injection.   Message copied by Cordelia Poche on Thu Jun 07, 2014 12:34 PM ------      Message from: Burney Gauze R      Created: Thu Jun 07, 2014 11:52 AM       Call - lambda light chain is still fairly stable!!  Testosterone is vry low!!  He needs Depo-testosterone injection of 400mg  x 1. Please call to see if he wants this.  This will help his bones and help his anemia!! Pete ------

## 2014-06-08 ENCOUNTER — Other Ambulatory Visit: Payer: Self-pay | Admitting: Family

## 2014-06-08 ENCOUNTER — Ambulatory Visit (HOSPITAL_BASED_OUTPATIENT_CLINIC_OR_DEPARTMENT_OTHER): Payer: Medicare HMO

## 2014-06-08 VITALS — BP 124/59 | HR 104 | Temp 97.5°F | Resp 16

## 2014-06-08 DIAGNOSIS — R7989 Other specified abnormal findings of blood chemistry: Secondary | ICD-10-CM

## 2014-06-08 DIAGNOSIS — C9002 Multiple myeloma in relapse: Secondary | ICD-10-CM

## 2014-06-08 DIAGNOSIS — E291 Testicular hypofunction: Secondary | ICD-10-CM

## 2014-06-08 MED ORDER — TESTOSTERONE CYPIONATE 200 MG/ML IM SOLN
INTRAMUSCULAR | Status: AC
  Filled 2014-06-08: qty 2

## 2014-06-08 MED ORDER — TESTOSTERONE CYPIONATE 200 MG/ML IM SOLN
400.0000 mg | Freq: Once | INTRAMUSCULAR | Status: AC
  Administered 2014-06-08: 400 mg via INTRAMUSCULAR

## 2014-06-08 NOTE — Patient Instructions (Signed)

## 2014-06-14 ENCOUNTER — Ambulatory Visit (HOSPITAL_BASED_OUTPATIENT_CLINIC_OR_DEPARTMENT_OTHER)
Admission: RE | Admit: 2014-06-14 | Discharge: 2014-06-14 | Disposition: A | Discharge status: Home / Self Care | Payer: Medicare HMO | Source: Ambulatory Visit | Attending: Hematology & Oncology | Admitting: Hematology & Oncology

## 2014-06-14 ENCOUNTER — Ambulatory Visit (HOSPITAL_BASED_OUTPATIENT_CLINIC_OR_DEPARTMENT_OTHER): Payer: Medicare HMO | Admitting: Hematology & Oncology

## 2014-06-14 ENCOUNTER — Encounter: Payer: Self-pay | Admitting: Hematology & Oncology

## 2014-06-14 ENCOUNTER — Other Ambulatory Visit (HOSPITAL_BASED_OUTPATIENT_CLINIC_OR_DEPARTMENT_OTHER): Payer: Medicare HMO | Admitting: Lab

## 2014-06-14 ENCOUNTER — Ambulatory Visit (HOSPITAL_BASED_OUTPATIENT_CLINIC_OR_DEPARTMENT_OTHER): Payer: Medicare HMO

## 2014-06-14 VITALS — BP 108/66 | HR 99 | Temp 97.5°F | Resp 18 | Ht 72.0 in | Wt 194.0 lb

## 2014-06-14 DIAGNOSIS — C9000 Multiple myeloma not having achieved remission: Secondary | ICD-10-CM | POA: Insufficient documentation

## 2014-06-14 DIAGNOSIS — C9002 Multiple myeloma in relapse: Secondary | ICD-10-CM

## 2014-06-14 DIAGNOSIS — R0781 Pleurodynia: Secondary | ICD-10-CM | POA: Diagnosis not present

## 2014-06-14 LAB — COMPREHENSIVE METABOLIC PANEL
ALT: <8 U/L (ref 0–53)
AST: 13 U/L (ref 0–37)
Albumin: 3.7 g/dL (ref 3.5–5.2)
Alkaline Phosphatase: 50 U/L (ref 39–117)
BUN: 28 mg/dL — ABNORMAL HIGH (ref 6–23)
CALCIUM: 12 mg/dL — AB (ref 8.4–10.5)
CO2: 20 meq/L (ref 19–32)
Chloride: 107 mEq/L (ref 96–112)
Creatinine, Ser: 3.2 mg/dL — ABNORMAL HIGH (ref 0.50–1.35)
Glucose, Bld: 115 mg/dL — ABNORMAL HIGH (ref 70–99)
Potassium: 4.7 mEq/L (ref 3.5–5.3)
SODIUM: 139 meq/L (ref 135–145)
TOTAL PROTEIN: 5.8 g/dL — AB (ref 6.0–8.3)
Total Bilirubin: 0.3 mg/dL (ref 0.2–1.2)

## 2014-06-14 LAB — CBC WITH DIFFERENTIAL (CANCER CENTER ONLY)
BASO#: 0 10*3/uL (ref 0.0–0.2)
BASO%: 0.2 % (ref 0.0–2.0)
EOS ABS: 0.1 10*3/uL (ref 0.0–0.5)
EOS%: 1.9 % (ref 0.0–7.0)
HCT: 27.3 % — ABNORMAL LOW (ref 38.7–49.9)
HGB: 8.9 g/dL — ABNORMAL LOW (ref 13.0–17.1)
LYMPH#: 0.5 10*3/uL — ABNORMAL LOW (ref 0.9–3.3)
LYMPH%: 11.2 % — ABNORMAL LOW (ref 14.0–48.0)
MCH: 31 pg (ref 28.0–33.4)
MCHC: 32.6 g/dL (ref 32.0–35.9)
MCV: 95 fL (ref 82–98)
MONO#: 0.5 10*3/uL (ref 0.1–0.9)
MONO%: 10.1 % (ref 0.0–13.0)
NEUT#: 3.6 10*3/uL (ref 1.5–6.5)
NEUT%: 76.6 % (ref 40.0–80.0)
PLATELETS: 163 10*3/uL (ref 145–400)
RBC: 2.87 10*6/uL — AB (ref 4.20–5.70)
RDW: 17.2 % — ABNORMAL HIGH (ref 11.1–15.7)
WBC: 4.7 10*3/uL (ref 4.0–10.0)

## 2014-06-14 MED ORDER — FENTANYL 75 MCG/HR TD PT72: 75.0000 ug | MEDICATED_PATCH | TRANSDERMAL | Status: AC

## 2014-06-14 MED ORDER — HYDROMORPHONE HCL 4 MG/ML IJ SOLN
INTRAMUSCULAR | Status: AC
  Filled 2014-06-14: qty 1

## 2014-06-14 MED ORDER — HYDROMORPHONE HCL 1 MG/ML IJ SOLN
3.0000 mg | Freq: Once | INTRAMUSCULAR | Status: AC
  Administered 2014-06-14: 3 mg via INTRAVENOUS

## 2014-06-14 NOTE — Patient Instructions (Signed)

## 2014-06-14 NOTE — Progress Notes (Signed)
Hematology and Oncology Follow Up Visit  Arthur Valdez [id number] 1932-10-11 80 y.o. 06/14/2014   Principle Diagnosis:  Refractory lambda light chain myeloma  Current Therapy:    Observation  Supportive care with transfusions as indicated     Interim History:  Mr.  Arthur Valdez is back for followup. He comes in in a wheelchair. He is clearly declining. We really have no other systemic therapy to offer him.  He has been to the emergency room. He is having a lot of pain over in his right lateral rib cage. This is the lower rib cage. His have some problems in his left hip. The MRI of the left femur showed some myelomatous deposits. There is no evidence of fracture. There is some thinning of the cortex.  His lambda light chain level is increasing. When we last checked it, it was 917 mg/dL. It increased from 862 mg/dL.  His weight is going down. His appetite is worse.  He still having pain problems.  I will increase his Duragesic patch up to 75 g. I told him to take 2 Percocet at a time if necessary.  I talked to he and his wife about hospice. I think it is now time for hospice. I have not talked to assess with him yet but this will be coming very soon. I explained to him what hospice is. I think that hospice will help him quite a bit. He and his wife the help at home. Hospice can help provide equipment for them. They have help provide medication.  I really believe that we are looking at no more than 2-3 months now. The fact that he is having more pain is definitely a sign that the myeloma is becoming more of a problem.  Currently, his performance status is ECOG 3.       Medications: Current outpatient prescriptions:albuterol (PROVENTIL HFA;VENTOLIN HFA) 108 (90 BASE) MCG/ACT inhaler, Inhale 2 puffs into the lungs every 6 (six) hours as needed for wheezing or shortness of breath., Disp: 1 Inhaler, Rfl: 4;  amLODipine (NORVASC) 10 MG tablet, Take 1 tablet (10 mg total) by mouth daily.,  Disp: 30 tablet, Rfl: 2;  B Complex-C (B-COMPLEX WITH VITAMIN C) tablet, Take 1 tablet by mouth daily., Disp: , Rfl:  docusate sodium (COLACE) 100 MG capsule, Take 100 mg by mouth daily as needed for mild constipation., Disp: , Rfl: ;  fentaNYL (DURAGESIC - DOSED MCG/HR) 75 MCG/HR, Place 1 patch (75 mcg total) onto the skin every 3 (three) days., Disp: 10 patch, Rfl: 0;  insulin glargine (LANTUS) 100 UNIT/ML injection, Inject 5 Units into the skin daily as needed (only as needed for high BG). Just take it when sugar is up per patient, Disp: , Rfl:  levothyroxine (SYNTHROID) 50 MCG tablet, Take 50 mcg by mouth daily before breakfast. , Disp: , Rfl: ;  LORazepam (ATIVAN) 2 MG tablet, Take 1 tablet (2 mg total) by mouth every 8 (eight) hours as needed for anxiety., Disp: 90 tablet, Rfl: 1;  omeprazole (PRILOSEC) 20 MG capsule, Take 20 mg by mouth 2 (two) times daily. , Disp: , Rfl: ;  oxycodone (OXY-IR) 5 MG capsule, Take 5 mg by mouth every 4 (four) hours as needed for pain., Disp: , Rfl:  oxyCODONE-acetaminophen (PERCOCET/ROXICET) 5-325 MG per tablet, Take 1 tablet by mouth every 4 (four) hours as needed for severe pain., Disp: 180 tablet, Rfl: 0;  potassium chloride SA (K-DUR,KLOR-CON) 20 MEQ tablet, Take 20 mEq by mouth daily., Disp: , Rfl: ;  sitaGLIPtin (JANUVIA) 100 MG tablet, Take 100 mg by mouth daily., Disp: , Rfl:  [DISCONTINUED] prochlorperazine (COMPAZINE) 10 MG tablet, Take 1 tablet (10 mg total) by mouth every 6 (six) hours as needed (Nausea or vomiting)., Disp: 30 tablet, Rfl: 1 No current facility-administered medications for this visit. Facility-Administered Medications Ordered in Other Visits: sodium chloride 0.9 % injection 10 mL, 10 mL, Intravenous, PRN, Volanda Napoleon, MD, 10 mL at 07/16/11 1002  Allergies:  Allergies  Allergen Reactions  . Raspberry Hives, Shortness Of Breath and Swelling    Takes benadryl for reaction    Past Medical History, Surgical history, Social history, and  Family History were reviewed and updated.  Review of Systems: As above  Physical Exam:  height is 6' (1.829 m) and weight is 194 lb (87.998 kg). His temperature is 97.5 F (36.4 C). His blood pressure is 108/66 and his pulse is 99. His respiration is 18.   Chronically ill-appearing African American gentleman. Head and neck exam shows no ocular or oral lesions. There are no palpable cervical or supraclavicular lymph nodes. Lungs are clear. Cardiac exam regular in rhythm with no murmurs, rubs or bruits. Abdomen is soft. He has good bowel sounds. There is no fluid wave. He does have tenderness over in the right lower anteriolateral ribs There is no palpable liver or spleen tip. Back exam shows no tenderness over the spine, ribs or hips, outside of the right lower rib cage. Extremities shows no clubbing, cyanosis or edema. Skin exam no rashes. Neurological exam shows no focal neurological deficits.  Lab Results  Component Value Date   WBC 4.7 06/14/2014   HGB 8.9* 06/14/2014   HCT 27.3* 06/14/2014   MCV 95 06/14/2014   PLT 163 06/14/2014     Chemistry      Component Value Date/Time   NA 140 06/06/2014 1017   NA 132* 06/02/2014 1745   K 3.7 06/06/2014 1017   K 4.0 06/02/2014 1745   CL 99 06/06/2014 1017   CL 96 06/02/2014 1745   CO2 24 06/06/2014 1017   CO2 18* 06/02/2014 1745   BUN 21 06/06/2014 1017   BUN 20 06/02/2014 1745   CREATININE 1.7* 06/06/2014 1017   CREATININE 2.06* 06/02/2014 1745      Component Value Date/Time   CALCIUM 10.8* 06/06/2014 1017   CALCIUM 9.9 06/02/2014 1745   ALKPHOS 59 06/06/2014 1017   ALKPHOS 59 05/16/2014 1327   AST 17 06/06/2014 1017   AST 15 05/16/2014 1327   ALT 15 06/06/2014 1017   ALT 8 05/16/2014 1327   BILITOT 0.60 06/06/2014 1017   BILITOT 0.4 05/16/2014 1327         Impression and Plan: Arthur Valdez is 80 year old gentleman with refractory light chain myeloma. Has lambda light chain myeloma.  We will see what the x-rays  show.  Laboratory spoken to Dr. Sondra Come of radiation oncology about seen him for palliative radiation.  Hopefully, the increase in pain medication will help him.  We will get him back in a couple weeks. I will have to talk to him at that time regarding his CODE STATUS. I really don't think he would want to be kept alive on machines. I personally, do not see any benefit to keep him alive artificially.   Volanda Napoleon, MD 10/29/20156:41 PM

## 2014-06-15 ENCOUNTER — Telehealth: Payer: Self-pay | Admitting: *Deleted

## 2014-06-15 NOTE — Telephone Encounter (Signed)
[facility] referral made per Dr. Marin Olp request

## 2014-06-21 ENCOUNTER — Ambulatory Visit
Admission: RE | Admit: 2014-06-21 | Discharge: 2014-06-21 | Disposition: A | Discharge status: Home / Self Care | Payer: Medicare HMO | Source: Ambulatory Visit | Attending: Radiation Oncology | Admitting: Radiation Oncology

## 2014-06-21 ENCOUNTER — Ambulatory Visit: Payer: Medicare HMO

## 2014-06-21 ENCOUNTER — Encounter: Payer: Self-pay | Admitting: *Deleted

## 2014-06-21 NOTE — Progress Notes (Signed)
Received call from Brandon Melnick, Hospice RN, that the patient is "rapidly declining."  The RN states that the pt and wife decided to make him a DNR. The pt is being transferred to [facility] place this afternoon. Dr Marin Olp informed of this info.

## 2014-06-22 ENCOUNTER — Ambulatory Visit: Payer: Medicare HMO | Admitting: Radiation Oncology

## 2014-06-22 ENCOUNTER — Ambulatory Visit: Payer: Medicare HMO

## 2014-06-28 ENCOUNTER — Other Ambulatory Visit: Payer: Medicare HMO | Admitting: Lab

## 2014-06-28 ENCOUNTER — Ambulatory Visit: Payer: Medicare HMO | Admitting: Family

## 2014-06-28 ENCOUNTER — Ambulatory Visit: Payer: Medicare HMO

## 2014-06-28 ENCOUNTER — Encounter: Payer: Self-pay | Admitting: *Deleted

## 2014-06-28 NOTE — Progress Notes (Signed)
Received notification from [facility] place that patient passed away 07-15-2014 at 9:20p.

## 2014-07-17 DEATH — deceased

## 2014-07-24 ENCOUNTER — Telehealth: Payer: Self-pay | Admitting: Family Medicine

## 2014-07-24 NOTE — Telephone Encounter (Signed)
Patient has passed away.

## 2014-08-01 ENCOUNTER — Other Ambulatory Visit: Payer: Self-pay | Admitting: Nurse Practitioner

## 2014-08-27 ENCOUNTER — Telehealth: Payer: Self-pay

## 2014-08-27 NOTE — Telephone Encounter (Signed)
MR said pt had never been seen here for what they are inquiring about. Release was sent Fri afternoon. MR faxed back a letter to [facility]

## 2014-08-27 NOTE — Telephone Encounter (Signed)
MR Arthur Valdez from [facility] called. She said she sent over a request for some information on this pt last week and then held on the phone for 7 min today. Can we please call her back at [phone] and see what she needs or see if we can find the fax please. Thanks so much.

## 2015-04-01 IMAGING — CR DG HIP (WITH OR WITHOUT PELVIS) 2-3V*L*
3 series · 3 of 3 positions shown · non-contrast
Comparison: 10/06/1946

CLINICAL DATA: Initial evaluation from pain from lower central
lumbar spine to lateral side of left hip for 3 days which began
while walking upstairs at his house, current history of myeloma

EXAM:
LEFT HIP - COMPLETE 2+ VIEW

[t pelvis a.p.]
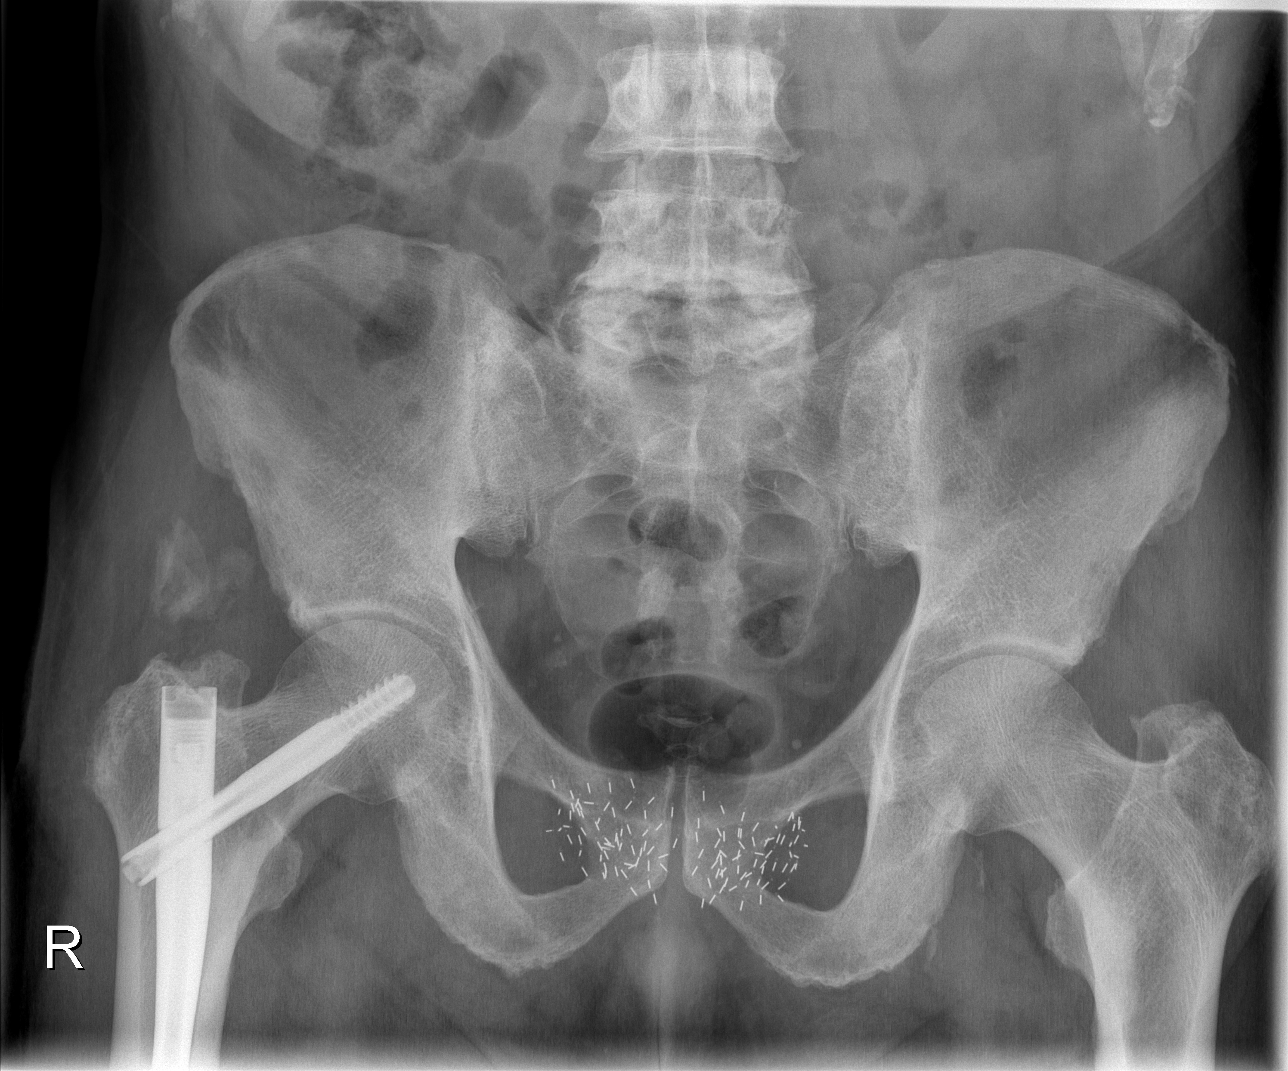

[t hip ap left]
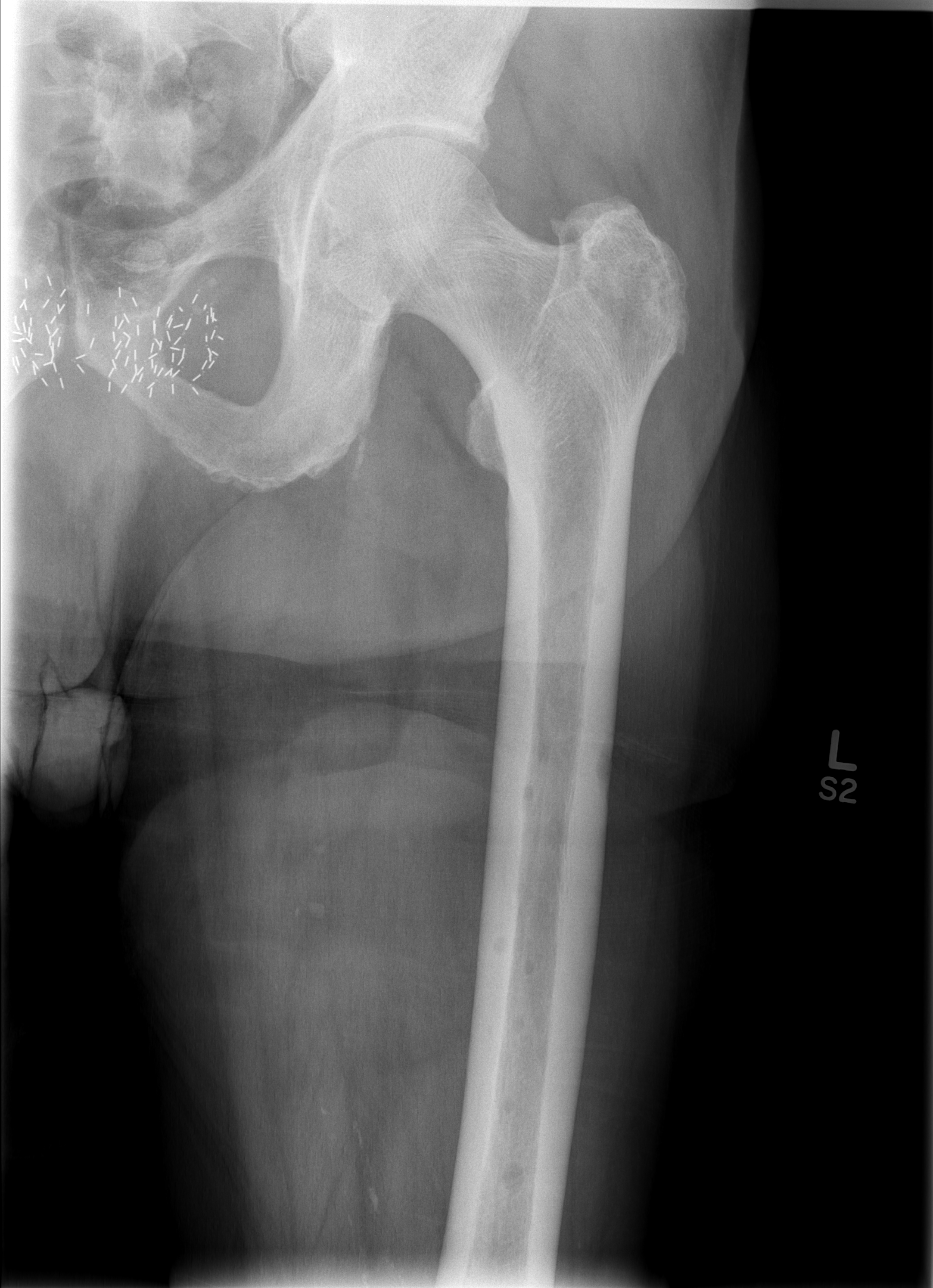

[t hip frog leg left]
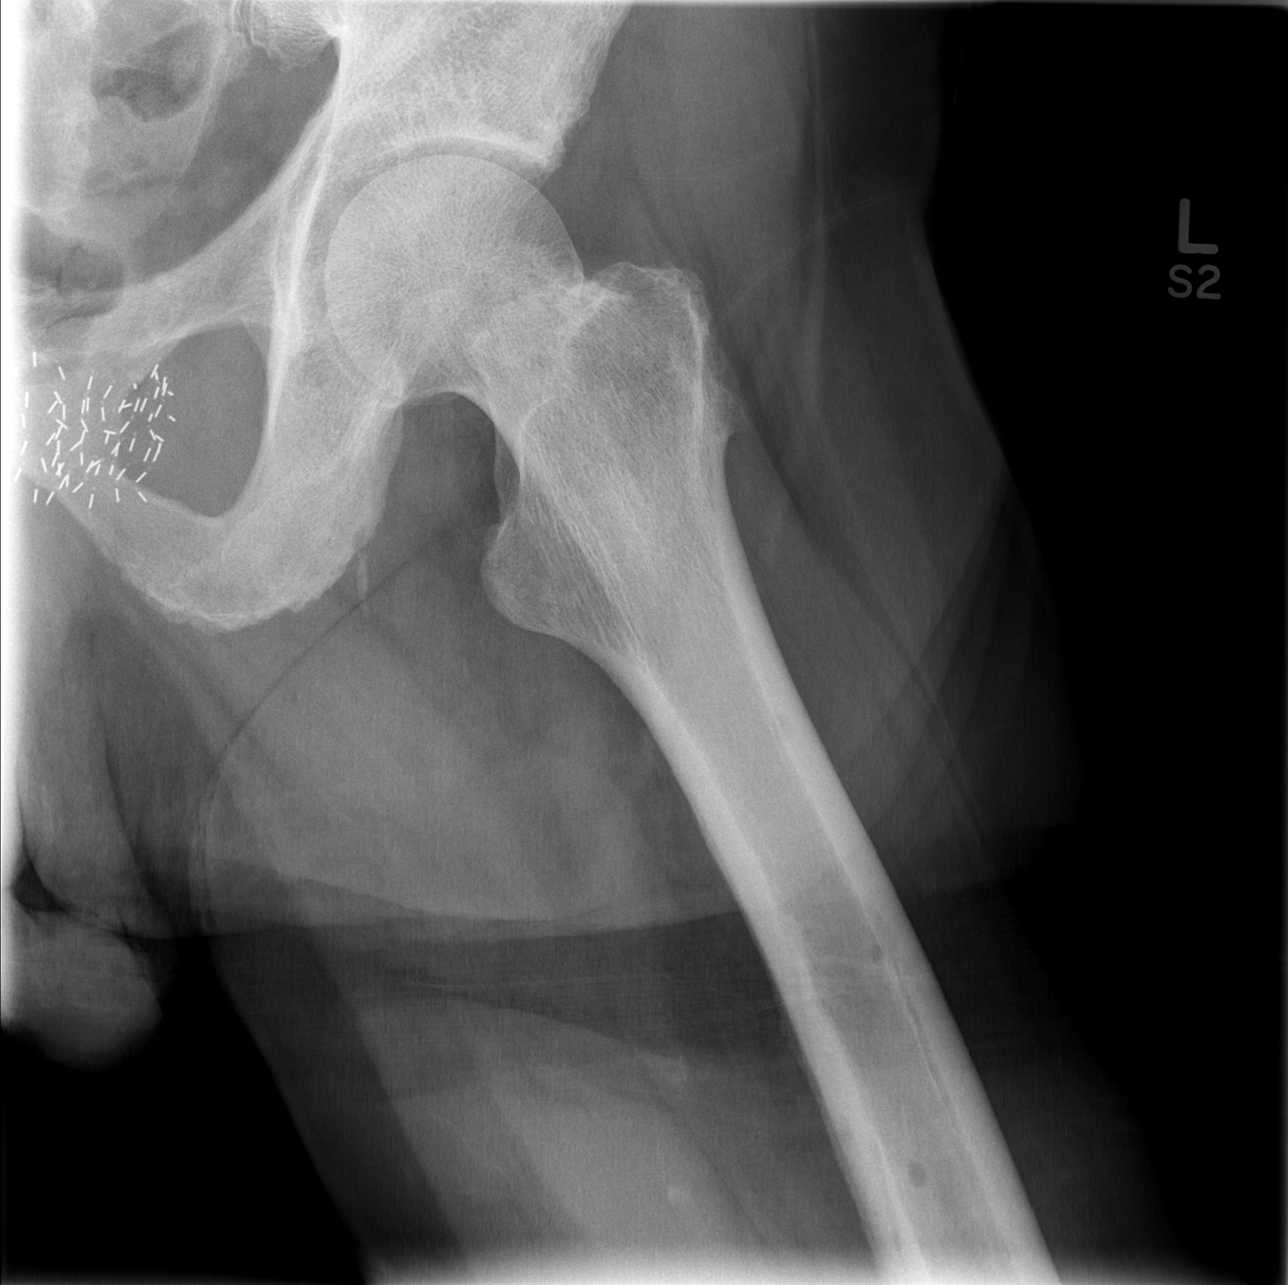

[3 of 3 positions shown; findings below may reference images not displayed]

FINDINGS: Intra medullary nail right femur with myositis ossificans in the
inferior gluteal musculature, stable. Numerous radiation therapy
seeds over the prostate. Mild left femoral artery atherosclerotic
calcification. Degenerative change lower lumbar spine and bilateral
sacroiliac joints. Numerous subcentimeter well-defined lytic lesions
throughout the visualized shaft of the femur consistent with
myeloma. No acute fracture or dislocation in the region of the left
hip joint.
IMPRESSION: No acute abnormality involving left hip. Evidence of myelomatous
involvement involving femoral shaft on the left. Degenerative
changes in the lumbar spine and sacroiliac joints.
